# Patient Record
Sex: Male | Born: 1940 | Race: White | Hispanic: No | Marital: Single | Smoking: Never smoker
Health system: Southern US, Community
[De-identification: ages and names within clinical notes are randomized; demographics above are authoritative.]

## PROBLEM LIST (undated history)

## (undated) DIAGNOSIS — E119 Type 2 diabetes mellitus without complications: Secondary | ICD-10-CM

## (undated) DIAGNOSIS — J9600 Acute respiratory failure, unspecified whether with hypoxia or hypercapnia: Secondary | ICD-10-CM

## (undated) DIAGNOSIS — I4891 Unspecified atrial fibrillation: Secondary | ICD-10-CM

## (undated) DIAGNOSIS — I2510 Atherosclerotic heart disease of native coronary artery without angina pectoris: Secondary | ICD-10-CM

## (undated) DIAGNOSIS — I10 Essential (primary) hypertension: Secondary | ICD-10-CM

## (undated) DIAGNOSIS — I5042 Chronic combined systolic (congestive) and diastolic (congestive) heart failure: Secondary | ICD-10-CM

---

## 1999-08-04 HISTORY — PX: CORONARY ARTERY BYPASS GRAFT: SHX141

## 1999-09-09 ENCOUNTER — Inpatient Hospital Stay (HOSPITAL_COMMUNITY): Admission: AD | Admit: 1999-09-09 | Discharge: 1999-09-16 | Payer: Self-pay | Admitting: Cardiovascular Disease

## 1999-09-10 ENCOUNTER — Encounter: Payer: Self-pay | Admitting: Cardiovascular Disease

## 1999-09-11 ENCOUNTER — Encounter: Payer: Self-pay | Admitting: Cardiothoracic Surgery

## 1999-09-12 ENCOUNTER — Encounter: Payer: Self-pay | Admitting: Cardiothoracic Surgery

## 1999-09-13 ENCOUNTER — Encounter: Payer: Self-pay | Admitting: Cardiothoracic Surgery

## 1999-09-27 ENCOUNTER — Inpatient Hospital Stay (HOSPITAL_COMMUNITY): Admission: EM | Admit: 1999-09-27 | Discharge: 1999-10-03 | Payer: Self-pay | Admitting: Emergency Medicine

## 1999-09-27 ENCOUNTER — Encounter: Payer: Self-pay | Admitting: Thoracic Surgery (Cardiothoracic Vascular Surgery)

## 2004-09-01 ENCOUNTER — Ambulatory Visit (HOSPITAL_COMMUNITY): Admission: RE | Admit: 2004-09-01 | Discharge: 2004-09-01 | Payer: Self-pay | Admitting: Internal Medicine

## 2004-09-01 ENCOUNTER — Ambulatory Visit: Payer: Self-pay | Admitting: Internal Medicine

## 2013-08-03 HISTORY — PX: LEFT HEART CATH: SHX5946

## 2013-08-17 ENCOUNTER — Emergency Department (HOSPITAL_COMMUNITY): Payer: Medicare Other

## 2013-08-17 ENCOUNTER — Inpatient Hospital Stay (HOSPITAL_COMMUNITY)
Admission: EM | Admit: 2013-08-17 | Discharge: 2013-08-23 | DRG: 280 | Disposition: A | Discharge status: Home / Self Care | Payer: Medicare Other | Attending: Internal Medicine | Admitting: Internal Medicine

## 2013-08-17 ENCOUNTER — Encounter (HOSPITAL_COMMUNITY): Payer: Self-pay | Admitting: Emergency Medicine

## 2013-08-17 ENCOUNTER — Other Ambulatory Visit: Payer: Self-pay

## 2013-08-17 DIAGNOSIS — I509 Heart failure, unspecified: Secondary | ICD-10-CM | POA: Diagnosis present

## 2013-08-17 DIAGNOSIS — Z79899 Other long term (current) drug therapy: Secondary | ICD-10-CM

## 2013-08-17 DIAGNOSIS — E876 Hypokalemia: Secondary | ICD-10-CM | POA: Diagnosis not present

## 2013-08-17 DIAGNOSIS — E872 Acidosis, unspecified: Secondary | ICD-10-CM | POA: Diagnosis present

## 2013-08-17 DIAGNOSIS — I2510 Atherosclerotic heart disease of native coronary artery without angina pectoris: Secondary | ICD-10-CM | POA: Diagnosis present

## 2013-08-17 DIAGNOSIS — E785 Hyperlipidemia, unspecified: Secondary | ICD-10-CM | POA: Diagnosis present

## 2013-08-17 DIAGNOSIS — I2589 Other forms of chronic ischemic heart disease: Secondary | ICD-10-CM | POA: Diagnosis present

## 2013-08-17 DIAGNOSIS — I4729 Other ventricular tachycardia: Secondary | ICD-10-CM | POA: Diagnosis not present

## 2013-08-17 DIAGNOSIS — F411 Generalized anxiety disorder: Secondary | ICD-10-CM | POA: Diagnosis present

## 2013-08-17 DIAGNOSIS — I4891 Unspecified atrial fibrillation: Secondary | ICD-10-CM | POA: Diagnosis present

## 2013-08-17 DIAGNOSIS — J811 Chronic pulmonary edema: Secondary | ICD-10-CM

## 2013-08-17 DIAGNOSIS — I10 Essential (primary) hypertension: Secondary | ICD-10-CM | POA: Diagnosis present

## 2013-08-17 DIAGNOSIS — I2582 Chronic total occlusion of coronary artery: Secondary | ICD-10-CM | POA: Diagnosis present

## 2013-08-17 DIAGNOSIS — I5043 Acute on chronic combined systolic (congestive) and diastolic (congestive) heart failure: Secondary | ICD-10-CM | POA: Diagnosis present

## 2013-08-17 DIAGNOSIS — I472 Ventricular tachycardia, unspecified: Secondary | ICD-10-CM | POA: Diagnosis not present

## 2013-08-17 DIAGNOSIS — E119 Type 2 diabetes mellitus without complications: Secondary | ICD-10-CM | POA: Diagnosis present

## 2013-08-17 DIAGNOSIS — I25810 Atherosclerosis of coronary artery bypass graft(s) without angina pectoris: Secondary | ICD-10-CM | POA: Diagnosis present

## 2013-08-17 DIAGNOSIS — I959 Hypotension, unspecified: Secondary | ICD-10-CM | POA: Diagnosis present

## 2013-08-17 DIAGNOSIS — I214 Non-ST elevation (NSTEMI) myocardial infarction: Secondary | ICD-10-CM

## 2013-08-17 DIAGNOSIS — I5041 Acute combined systolic (congestive) and diastolic (congestive) heart failure: Secondary | ICD-10-CM | POA: Diagnosis present

## 2013-08-17 DIAGNOSIS — J9600 Acute respiratory failure, unspecified whether with hypoxia or hypercapnia: Secondary | ICD-10-CM

## 2013-08-17 DIAGNOSIS — I498 Other specified cardiac arrhythmias: Secondary | ICD-10-CM | POA: Diagnosis present

## 2013-08-17 DIAGNOSIS — J810 Acute pulmonary edema: Secondary | ICD-10-CM | POA: Diagnosis present

## 2013-08-17 DIAGNOSIS — Z880 Allergy status to penicillin: Secondary | ICD-10-CM

## 2013-08-17 HISTORY — DX: Type 2 diabetes mellitus without complications: E11.9

## 2013-08-17 HISTORY — DX: Essential (primary) hypertension: I10

## 2013-08-17 HISTORY — DX: Chronic combined systolic (congestive) and diastolic (congestive) heart failure: I50.42

## 2013-08-17 HISTORY — DX: Acute respiratory failure, unspecified whether with hypoxia or hypercapnia: J96.00

## 2013-08-17 HISTORY — DX: Atherosclerotic heart disease of native coronary artery without angina pectoris: I25.10

## 2013-08-17 HISTORY — DX: Unspecified atrial fibrillation: I48.91

## 2013-08-17 LAB — CBC WITH DIFFERENTIAL/PLATELET
BASOS PCT: 1 % (ref 0–1)
Basophils Absolute: 0.2 10*3/uL — ABNORMAL HIGH (ref 0.0–0.1)
EOS ABS: 0.3 10*3/uL (ref 0.0–0.7)
Eosinophils Relative: 2 % (ref 0–5)
HEMATOCRIT: 44.6 % (ref 39.0–52.0)
Hemoglobin: 15.2 g/dL (ref 13.0–17.0)
Lymphocytes Relative: 44 % (ref 12–46)
Lymphs Abs: 7 10*3/uL — ABNORMAL HIGH (ref 0.7–4.0)
MCH: 31.5 pg (ref 26.0–34.0)
MCHC: 34.1 g/dL (ref 30.0–36.0)
MCV: 92.3 fL (ref 78.0–100.0)
Monocytes Absolute: 0.6 10*3/uL (ref 0.1–1.0)
Monocytes Relative: 4 % (ref 3–12)
NEUTROS ABS: 7.7 10*3/uL (ref 1.7–7.7)
Neutrophils Relative %: 49 % (ref 43–77)
Platelets: 282 10*3/uL (ref 150–400)
RBC: 4.83 MIL/uL (ref 4.22–5.81)
RDW: 13.9 % (ref 11.5–15.5)
WBC: 15.8 10*3/uL — ABNORMAL HIGH (ref 4.0–10.5)

## 2013-08-17 LAB — BLOOD GAS, ARTERIAL
ACID-BASE DEFICIT: 2.7 mmol/L — AB (ref 0.0–2.0)
Acid-base deficit: 5.7 mmol/L — ABNORMAL HIGH (ref 0.0–2.0)
BICARBONATE: 21.4 meq/L (ref 20.0–24.0)
Bicarbonate: 22.4 mEq/L (ref 20.0–24.0)
DRAWN BY: 213101
Drawn by: 213101
FIO2: 100 %
FIO2: 100 %
LHR: 16 {breaths}/min
MECHVT: 500 mL
O2 Saturation: 91.3 %
O2 Saturation: 95.2 %
PCO2 ART: 44.3 mmHg (ref 35.0–45.0)
PEEP: 5 cmH2O
PEEP: 5 cmH2O
PH ART: 7.181 — AB (ref 7.350–7.450)
PO2 ART: 82.1 mmHg (ref 80.0–100.0)
Patient temperature: 37
Patient temperature: 37
RATE: 15 resp/min
TCO2: 19.9 mmol/L (ref 0–100)
TCO2: 20.2 mmol/L (ref 0–100)
VT: 600 mL
pCO2 arterial: 59.6 mmHg (ref 35.0–45.0)
pH, Arterial: 7.324 — ABNORMAL LOW (ref 7.350–7.450)
pO2, Arterial: 91.1 mmHg (ref 80.0–100.0)

## 2013-08-17 LAB — URINALYSIS, ROUTINE W REFLEX MICROSCOPIC
Bilirubin Urine: NEGATIVE
Glucose, UA: 250 mg/dL — AB
Ketones, ur: NEGATIVE mg/dL
LEUKOCYTES UA: NEGATIVE
NITRITE: NEGATIVE
PH: 5.5 (ref 5.0–8.0)
Protein, ur: 100 mg/dL — AB
Urobilinogen, UA: 0.2 mg/dL (ref 0.0–1.0)

## 2013-08-17 LAB — BASIC METABOLIC PANEL
BUN: 24 mg/dL — ABNORMAL HIGH (ref 6–23)
CALCIUM: 8.1 mg/dL — AB (ref 8.4–10.5)
CO2: 19 mEq/L (ref 19–32)
Chloride: 99 mEq/L (ref 96–112)
Creatinine, Ser: 1.09 mg/dL (ref 0.50–1.35)
GFR calc Af Amer: 75 mL/min — ABNORMAL LOW (ref >90)
GFR calc non Af Amer: 65 mL/min — ABNORMAL LOW (ref >90)
Glucose, Bld: 366 mg/dL — ABNORMAL HIGH (ref 70–99)
Potassium: 4.3 mEq/L (ref 3.7–5.3)
Sodium: 138 mEq/L (ref 137–147)

## 2013-08-17 LAB — URINE MICROSCOPIC-ADD ON

## 2013-08-17 LAB — PRO B NATRIURETIC PEPTIDE: PRO B NATRI PEPTIDE: 5197 pg/mL — AB (ref 0–125)

## 2013-08-17 LAB — LACTIC ACID, PLASMA: LACTIC ACID, VENOUS: 5.1 mmol/L — AB (ref 0.5–2.2)

## 2013-08-17 LAB — PROTIME-INR
INR: 1.19 (ref 0.00–1.49)
PROTHROMBIN TIME: 14.8 s (ref 11.6–15.2)

## 2013-08-17 LAB — APTT: APTT: 31 s (ref 24–37)

## 2013-08-17 LAB — TROPONIN I: Troponin I: <0.3 ng/mL (ref <0.30)

## 2013-08-17 MED ORDER — ETOMIDATE 2 MG/ML IV SOLN
30.0000 mg | Freq: Once | INTRAVENOUS | Status: AC
  Administered 2013-08-17: 30 mg via INTRAVENOUS

## 2013-08-17 MED ORDER — MIDAZOLAM HCL 5 MG/5ML IJ SOLN
INTRAMUSCULAR | Status: AC
  Administered 2013-08-17: 21:00:00 4 mg via INTRAVENOUS
  Filled 2013-08-17: qty 5

## 2013-08-17 MED ORDER — MIDAZOLAM HCL 50 MG/10ML IJ SOLN
INTRAMUSCULAR | Status: AC
  Filled 2013-08-17: qty 1

## 2013-08-17 MED ORDER — DILTIAZEM HCL 100 MG IV SOLR
10.0000 mg/h | Freq: Once | INTRAVENOUS | Status: AC
  Administered 2013-08-17: 10 mg/h via INTRAVENOUS

## 2013-08-17 MED ORDER — MIDAZOLAM HCL 5 MG/5ML IJ SOLN
INTRAMUSCULAR | Status: AC
  Administered 2013-08-17: 20:00:00 4 mg via INTRAVENOUS
  Filled 2013-08-17: qty 5

## 2013-08-17 MED ORDER — IOHEXOL 350 MG/ML SOLN
100.0000 mL | Freq: Once | INTRAVENOUS | Status: AC | PRN
  Administered 2013-08-17: 100 mL via INTRAVENOUS

## 2013-08-17 MED ORDER — AMIODARONE HCL IN DEXTROSE 360-4.14 MG/200ML-% IV SOLN: 60.0000 mg/h | INTRAVENOUS | Status: DC

## 2013-08-17 MED ORDER — DILTIAZEM HCL 25 MG/5ML IV SOLN
10.0000 mg | Freq: Once | INTRAVENOUS | Status: AC
  Administered 2013-08-17: 10 mg via INTRAVENOUS

## 2013-08-17 MED ORDER — AMIODARONE HCL IN DEXTROSE 360-4.14 MG/200ML-% IV SOLN: 30.0000 mg/h | INTRAVENOUS | Status: DC

## 2013-08-17 MED ORDER — SUCCINYLCHOLINE CHLORIDE 20 MG/ML IJ SOLN
150.0000 mg | Freq: Once | INTRAMUSCULAR | Status: AC
  Administered 2013-08-17: 150 mg via INTRAVENOUS

## 2013-08-17 MED ORDER — SODIUM CHLORIDE 0.9 % IV SOLN
2.0000 mg/h | INTRAVENOUS | Status: DC
  Administered 2013-08-17: 2 mg/h via INTRAVENOUS

## 2013-08-17 MED ORDER — AMIODARONE HCL 150 MG/3ML IV SOLN
INTRAVENOUS | Status: AC
  Filled 2013-08-17: qty 9

## 2013-08-17 MED ORDER — MIDAZOLAM HCL 5 MG/5ML IJ SOLN
4.0000 mg | Freq: Once | INTRAMUSCULAR | Status: AC
  Administered 2013-08-17 (×2): 4 mg via INTRAVENOUS

## 2013-08-17 MED ORDER — DILTIAZEM HCL 25 MG/5ML IV SOLN
INTRAVENOUS | Status: AC
  Filled 2013-08-17: qty 5

## 2013-08-17 NOTE — ED Provider Notes (Signed)
CSN: [id number]     Arrival date & time 08/17/13  1951 History  This chart was scribed for Gilda Crease, * by Danella Maiers, ED Scribe. This patient was seen in room APA01/APA01 and the patient's care was started at 8:01 PM.    Chief Complaint  Patient presents with  . Respiratory Distress   The history is provided by the EMS personnel. The history is limited by the condition of the patient. No language interpreter was used.  Level 5 Caveat - Respiratory Distress  HPI Comments: Randy Nunez is a 73 y.o. male with a h/o recent pneumonia, HTN, hyperlipidemia brought in by ambulance who presents to the Emergency Department in respiratory distress onset 1 1/2 hours ago per EMS.    No past medical history on file. No past surgical history on file. No family history on file. History  Substance Use Topics  . Smoking status: Not on file  . Smokeless tobacco: Not on file  . Alcohol Use: Not on file    Review of Systems  Unable to perform ROS: Severe respiratory distress    Allergies  Review of patient's allergies indicates not on file.  Home Medications  No current outpatient prescriptions on file. BP 105/67  Pulse 89  Temp(Src) 98.5 F (36.9 C) (Rectal)  Resp 17  SpO2 94% Physical Exam  Constitutional: He appears well-developed and well-nourished.  HENT:  Head: Normocephalic and atraumatic.  Right Ear: Hearing normal.  Left Ear: Hearing normal.  Nose: Nose normal.  Mouth/Throat: Oropharynx is clear and moist and mucous membranes are normal.  Eyes: Conjunctivae and EOM are normal. Pupils are equal, round, and reactive to light.  Neck: Normal range of motion. Neck supple.  Cardiovascular: S1 normal and S2 normal.  An irregularly irregular rhythm present. Tachycardia present.  Exam reveals no gallop and no friction rub.   No murmur heard. Pulmonary/Chest: Accessory muscle usage present. He is in respiratory distress. He has decreased breath sounds. He has rales. He  exhibits no tenderness.  Abdominal: Soft. Normal appearance and bowel sounds are normal. There is no hepatosplenomegaly. There is no tenderness. There is no rebound, no guarding, no tenderness at McBurney's point and negative Murphy's sign. No hernia.  Musculoskeletal: Normal range of motion.  Neurological: He is alert. He has normal strength. No cranial nerve deficit or sensory deficit. Coordination normal.  Noncommunicative secondary to severe respiratory distress  Skin: Skin is warm, dry and intact. No rash noted. No cyanosis.  Psychiatric: He has a normal mood and affect. His speech is normal and behavior is normal. Thought content normal.    ED Course  INTUBATION Date/Time: 08/17/2013 10:46 PM Performed by: Gilda Crease. Authorized by: Gilda Crease Consent: The procedure was performed in an emergent situation. Patient identity confirmed: arm band and hospital-assigned identification number Time out: Immediately prior to procedure a "time out" was called to verify the correct patient, procedure, equipment, support staff and site/side marked as required. Indications: respiratory distress, respiratory failure and airway protection Intubation method: direct Patient status: paralyzed (RSI) Preoxygenation: BVM Pretreatment medications: none Sedatives: propofol Paralytic: succinylcholine Laryngoscope size: Mac 4 Tube size: 8.0 mm Tube type: cuffed Number of attempts: 1 Cricoid pressure: no Cords visualized: yes Post-procedure assessment: chest rise and CO2 detector Breath sounds: equal and absent over the epigastrium Cuff inflated: yes ETT to lip: 25 cm Tube secured with: ETT holder Chest x-ray interpreted by me and radiologist. Chest x-ray findings: endotracheal tube in appropriate position Patient tolerance: Patient  tolerated the procedure well with no immediate complications.   (including critical care time) Medications  amiodarone (NEXTERONE PREMIX) 360  mg/200 mL dextrose IV infusion (60 mg/hr Intravenous Not Given 08/17/13 2047)  amiodarone (NEXTERONE PREMIX) 360 mg/200 mL dextrose IV infusion (not administered)  midazolam (VERSED) 1 mg/mL in sodium chloride 0.9 % 50 mL infusion (3 mg/hr Intravenous Rate/Dose Change 08/17/13 2136)  etomidate (AMIDATE) injection 30 mg (30 mg Intravenous Given 08/17/13 1958)  succinylcholine (ANECTINE) injection 150 mg (150 mg Intravenous Given 08/17/13 1959)  diltiazem (CARDIZEM) injection 10 mg (0 mg Intravenous Stopped 08/17/13 2017)  diltiazem (CARDIZEM) injection 10 mg (0 mg Intravenous Stopped 08/17/13 2032)  midazolam (VERSED) 5 MG/5ML injection 4 mg (4 mg Intravenous Given 08/17/13 2032)  diltiazem (CARDIZEM) 100 mg in dextrose 5 % 100 mL infusion (10 mg/hr Intravenous New Bag/Given 08/17/13 2015)    DIAGNOSTIC STUDIES: Oxygen Saturation is 94% on RA, adequate by my interpretation.    COORDINATION OF CARE: 8:07 PM- Treatment plan includes EKG, CXR, troponin, basic labs.    Labs Review Labs Reviewed  CBC WITH DIFFERENTIAL - Abnormal; Notable for the following:    WBC 15.8 (*)    Lymphs Abs 7.0 (*)    Basophils Absolute 0.2 (*)    All other components within normal limits  BASIC METABOLIC PANEL - Abnormal; Notable for the following:    Glucose, Bld 366 (*)    BUN 24 (*)    Calcium 8.1 (*)    GFR calc non Af Amer 65 (*)    GFR calc Af Amer 75 (*)    All other components within normal limits  PRO B NATRIURETIC PEPTIDE - Abnormal; Notable for the following:    Pro B Natriuretic peptide (BNP) 5197.0 (*)    All other components within normal limits  LACTIC ACID, PLASMA - Abnormal; Notable for the following:    Lactic Acid, Venous 5.1 (*)    All other components within normal limits  BLOOD GAS, ARTERIAL - Abnormal; Notable for the following:    pH, Arterial 7.181 (*)    pCO2 arterial 59.6 (*)    Acid-base deficit 5.7 (*)    All other components within normal limits  URINALYSIS, ROUTINE W REFLEX  MICROSCOPIC - Abnormal; Notable for the following:    Specific Gravity, Urine >1.030 (*)    Glucose, UA 250 (*)    Hgb urine dipstick SMALL (*)    Protein, ur 100 (*)    All other components within normal limits  URINE MICROSCOPIC-ADD ON - Abnormal; Notable for the following:    Squamous Epithelial / LPF FEW (*)    All other components within normal limits  TROPONIN I  PROTIME-INR  APTT  PATHOLOGIST SMEAR REVIEW   Imaging Review Dg Chest Port 1 View  08/17/2013   CLINICAL DATA:  Respiratory respiratory distress. ET tube placement.  EXAM: PORTABLE CHEST - 1 VIEW  COMPARISON:  None.  FINDINGS: NG tube is in place and courses into the stomach and below the inferior margin of the film. Endotracheal tube is also in place with the tip approximately 2.6 cm above the carina. There is marked cardiomegaly and extensive bilateral pulmonary edema. No pneumothorax or pleural effusion.  IMPRESSION: ETT tip is approximately 2.6 cm above the carina.  Cardiomegaly and extensive bilateral pulmonary edema.   Electronically Signed   By: Drusilla Kannerhomas  Dalessio M.D.   On: 08/17/2013 20:14    EKG Interpretation   None       MDM  Diagnosis: 1. Pulmonary edema 2. Atrial fibrillation with rapid ventricular response  Patient brought to the ER by EMS with severe respiratory distress. Patient was extremely fatigued and it was felt that the patient was in severe respiratory failure, possible respiratory arrest in pending. It was decided to intubate the patient. This was performed without difficulty.   EMS reported the patient has been in atrial fibrillation with aberrant conduction, rapid ventricular response. He has no known history of A. fib. Initially the patient was extremely rapid with a wide-complex tachycardia, V. tach was considered. Amiodarone drip was considered, but as the patient was monitored for a short period of time his rhythm was convincingly irregular and atrial fibrillation with aberrant conduction or  left bundle branch block was confirmed. It was therefore felt that Cardizem would be appropriate. The patient was given a Cardizem bolus with decrease of his heart rate into the 110 range. Patient's QRS complex became narrow and it was irregular, consistent with atrial fibrillation. The patient was placed on a Cardizem drip with further improvement in his rate, down into the 90s.  Patient was administered IV Versed for sedation. He has required titration upwards for agitation.  Chest x-ray performed after intubation revealed that the patient did in fact have pulmonary edema.  Case was discussed with Doctor Tyson Alias, call for critical care at [facility]. He has accepted the patient for transfer. He did request CT angiography to rule out PE. This was performed, no PE is seen. ET tube is now seen as too deep, at the carina. It was withdrawn 3 cm.  When he came back from radiology, he was also somewhat hypotensive, systolic around 90. Cardizem was briefly stopped and then restarted at 5 (was at 10 mg per hour).  CRITICAL CARE Performed by: Gilda Crease   Total critical care time:  Critical care time was exclusive of separately billable procedures and treating other patients.  Critical care was necessary to treat or prevent imminent or life-threatening deterioration.  Critical care was time spent personally by me on the following activities: development of treatment plan with patient and/or surrogate as well as nursing, discussions with consultants, evaluation of patient's response to treatment, examination of patient, obtaining history from patient or surrogate, ordering and performing treatments and interventions, ordering and review of laboratory studies, ordering and review of radiographic studies, pulse oximetry and re-evaluation of patient's condition.     I personally performed the services described in this documentation, which was scribed in my presence. The recorded  information has been reviewed and is accurate.   Gilda Crease, MD 08/17/13 2251

## 2013-08-17 NOTE — ED Notes (Signed)
Pt from home by [facility], respiratory distress, bipap en routed,-

## 2013-08-17 NOTE — ED Notes (Signed)
Per family, pt was working today and then helped someone pump some gas and suddenly became sob.

## 2013-08-18 ENCOUNTER — Encounter (HOSPITAL_COMMUNITY): Payer: Self-pay | Admitting: Nurse Practitioner

## 2013-08-18 ENCOUNTER — Inpatient Hospital Stay (HOSPITAL_COMMUNITY): Payer: Medicare Other

## 2013-08-18 DIAGNOSIS — E119 Type 2 diabetes mellitus without complications: Secondary | ICD-10-CM | POA: Diagnosis present

## 2013-08-18 DIAGNOSIS — I10 Essential (primary) hypertension: Secondary | ICD-10-CM

## 2013-08-18 DIAGNOSIS — J9600 Acute respiratory failure, unspecified whether with hypoxia or hypercapnia: Secondary | ICD-10-CM

## 2013-08-18 DIAGNOSIS — I509 Heart failure, unspecified: Secondary | ICD-10-CM

## 2013-08-18 DIAGNOSIS — J810 Acute pulmonary edema: Secondary | ICD-10-CM

## 2013-08-18 DIAGNOSIS — I214 Non-ST elevation (NSTEMI) myocardial infarction: Secondary | ICD-10-CM | POA: Diagnosis present

## 2013-08-18 DIAGNOSIS — I2510 Atherosclerotic heart disease of native coronary artery without angina pectoris: Secondary | ICD-10-CM | POA: Diagnosis present

## 2013-08-18 DIAGNOSIS — E785 Hyperlipidemia, unspecified: Secondary | ICD-10-CM

## 2013-08-18 DIAGNOSIS — I5043 Acute on chronic combined systolic (congestive) and diastolic (congestive) heart failure: Secondary | ICD-10-CM | POA: Diagnosis present

## 2013-08-18 DIAGNOSIS — I517 Cardiomegaly: Secondary | ICD-10-CM

## 2013-08-18 DIAGNOSIS — I4891 Unspecified atrial fibrillation: Secondary | ICD-10-CM

## 2013-08-18 LAB — BASIC METABOLIC PANEL
BUN: 23 mg/dL (ref 6–23)
BUN: 22 mg/dL (ref 6–23)
CHLORIDE: 105 meq/L (ref 96–112)
CO2: 23 mEq/L (ref 19–32)
CO2: 24 meq/L (ref 19–32)
CREATININE: 0.95 mg/dL (ref 0.50–1.35)
Calcium: 8.1 mg/dL — ABNORMAL LOW (ref 8.4–10.5)
Calcium: 8.2 mg/dL — ABNORMAL LOW (ref 8.4–10.5)
Chloride: 106 mEq/L (ref 96–112)
Creatinine, Ser: 0.96 mg/dL (ref 0.50–1.35)
GFR calc Af Amer: >90 mL/min (ref >90)
GFR calc non Af Amer: 80 mL/min — ABNORMAL LOW (ref >90)
GFR calc non Af Amer: 80 mL/min — ABNORMAL LOW (ref >90)
GLUCOSE: 144 mg/dL — AB (ref 70–99)
GLUCOSE: 110 mg/dL — AB (ref 70–99)
POTASSIUM: 4.2 meq/L (ref 3.7–5.3)
Potassium: 4 mEq/L (ref 3.7–5.3)
Sodium: 141 mEq/L (ref 137–147)
Sodium: 143 mEq/L (ref 137–147)

## 2013-08-18 LAB — GLUCOSE, CAPILLARY
GLUCOSE-CAPILLARY: 153 mg/dL — AB (ref 70–99)
GLUCOSE-CAPILLARY: 102 mg/dL — AB (ref 70–99)
GLUCOSE-CAPILLARY: 127 mg/dL — AB (ref 70–99)
Glucose-Capillary: 109 mg/dL — ABNORMAL HIGH (ref 70–99)
Glucose-Capillary: 124 mg/dL — ABNORMAL HIGH (ref 70–99)
Glucose-Capillary: 112 mg/dL — ABNORMAL HIGH (ref 70–99)

## 2013-08-18 LAB — HEMOGLOBIN A1C
Hgb A1c MFr Bld: 6.6 % — ABNORMAL HIGH (ref <5.7)
MEAN PLASMA GLUCOSE: 143 mg/dL — AB (ref <117)

## 2013-08-18 LAB — MRSA PCR SCREENING: MRSA BY PCR: NEGATIVE

## 2013-08-18 LAB — HEPARIN LEVEL (UNFRACTIONATED): HEPARIN UNFRACTIONATED: 0.46 [IU]/mL (ref 0.30–0.70)

## 2013-08-18 LAB — TRIGLYCERIDES: Triglycerides: 76 mg/dL (ref <150)

## 2013-08-18 LAB — LACTIC ACID, PLASMA: Lactic Acid, Venous: 2.1 mmol/L (ref 0.5–2.2)

## 2013-08-18 LAB — TROPONIN I
Troponin I: 1.41 ng/mL (ref <0.30)
Troponin I: 3.09 ng/mL (ref <0.30)

## 2013-08-18 LAB — PHOSPHORUS: Phosphorus: 3.7 mg/dL (ref 2.3–4.6)

## 2013-08-18 LAB — MAGNESIUM: Magnesium: 1.7 mg/dL (ref 1.5–2.5)

## 2013-08-18 LAB — TSH: TSH: 1.358 u[IU]/mL (ref 0.350–4.500)

## 2013-08-18 MED ORDER — ASPIRIN 325 MG PO TABS
325.0000 mg | ORAL_TABLET | Freq: Once | ORAL | Status: AC
  Administered 2013-08-18: 325 mg
  Filled 2013-08-18: qty 1

## 2013-08-18 MED ORDER — HEPARIN (PORCINE) IN NACL 100-0.45 UNIT/ML-% IJ SOLN
1300.0000 [IU]/h | INTRAMUSCULAR | Status: DC
  Administered 2013-08-18 – 2013-08-19 (×2): 1300 [IU]/h via INTRAVENOUS
  Filled 2013-08-18 (×5): qty 250

## 2013-08-18 MED ORDER — AMIODARONE LOAD VIA INFUSION
150.0000 mg | Freq: Once | INTRAVENOUS | Status: AC
  Administered 2013-08-18: 150 mg via INTRAVENOUS
  Filled 2013-08-18: qty 83.34

## 2013-08-18 MED ORDER — ASPIRIN 325 MG PO TABS
325.0000 mg | ORAL_TABLET | Freq: Every day | ORAL | Status: DC
  Administered 2013-08-18 – 2013-08-20 (×3): 325 mg
  Filled 2013-08-18 (×4): qty 1

## 2013-08-18 MED ORDER — FENTANYL CITRATE 0.05 MG/ML IJ SOLN: 50.0000 ug | INTRAMUSCULAR | Status: DC | PRN

## 2013-08-18 MED ORDER — AMIODARONE HCL IN DEXTROSE 360-4.14 MG/200ML-% IV SOLN
60.0000 mg/h | INTRAVENOUS | Status: DC
  Administered 2013-08-18: 60 mg/h via INTRAVENOUS

## 2013-08-18 MED ORDER — ALPRAZOLAM 0.5 MG PO TABS
0.5000 mg | ORAL_TABLET | Freq: Every day | ORAL | Status: DC | PRN
  Administered 2013-08-19 – 2013-08-20 (×2): 0.5 mg via ORAL
  Filled 2013-08-18 (×2): qty 1

## 2013-08-18 MED ORDER — LABETALOL HCL 5 MG/ML IV SOLN
5.0000 mg | INTRAVENOUS | Status: DC | PRN
  Filled 2013-08-18 (×2): qty 4

## 2013-08-18 MED ORDER — ATORVASTATIN CALCIUM 80 MG PO TABS
80.0000 mg | ORAL_TABLET | Freq: Every day | ORAL | Status: DC
  Administered 2013-08-18 – 2013-08-22 (×6): 80 mg
  Filled 2013-08-18 (×8): qty 1

## 2013-08-18 MED ORDER — SODIUM CHLORIDE 0.9 % IJ SOLN
3.0000 mL | Freq: Two times a day (BID) | INTRAMUSCULAR | Status: DC
  Administered 2013-08-18 – 2013-08-20 (×5): 3 mL via INTRAVENOUS

## 2013-08-18 MED ORDER — FUROSEMIDE 10 MG/ML IJ SOLN
40.0000 mg | Freq: Two times a day (BID) | INTRAMUSCULAR | Status: DC
  Administered 2013-08-18: 40 mg via INTRAVENOUS
  Filled 2013-08-18: qty 4

## 2013-08-18 MED ORDER — CARVEDILOL 3.125 MG PO TABS
3.1250 mg | ORAL_TABLET | Freq: Two times a day (BID) | ORAL | Status: DC
  Administered 2013-08-18: 3.125 mg via ORAL
  Filled 2013-08-18 (×3): qty 1

## 2013-08-18 MED ORDER — HEPARIN SODIUM (PORCINE) 5000 UNIT/ML IJ SOLN
5000.0000 [IU] | Freq: Three times a day (TID) | INTRAMUSCULAR | Status: DC
  Administered 2013-08-18: 5000 [IU] via SUBCUTANEOUS
  Filled 2013-08-18 (×4): qty 1

## 2013-08-18 MED ORDER — HEPARIN BOLUS VIA INFUSION
3000.0000 [IU] | Freq: Once | INTRAVENOUS | Status: AC
  Administered 2013-08-18: 3000 [IU] via INTRAVENOUS
  Filled 2013-08-18: qty 3000

## 2013-08-18 MED ORDER — FUROSEMIDE 10 MG/ML IJ SOLN
80.0000 mg | Freq: Two times a day (BID) | INTRAMUSCULAR | Status: DC
  Administered 2013-08-18: 80 mg via INTRAVENOUS
  Filled 2013-08-18 (×3): qty 8

## 2013-08-18 MED ORDER — AMIODARONE HCL IN DEXTROSE 360-4.14 MG/200ML-% IV SOLN
30.0000 mg/h | INTRAVENOUS | Status: DC
  Filled 2013-08-18: qty 200

## 2013-08-18 MED ORDER — METOPROLOL TARTRATE 1 MG/ML IV SOLN
INTRAVENOUS | Status: AC
  Administered 2013-08-18: 5 mg
  Filled 2013-08-18: qty 5

## 2013-08-18 MED ORDER — BIOTENE DRY MOUTH MT LIQD
15.0000 mL | Freq: Four times a day (QID) | OROMUCOSAL | Status: DC
  Administered 2013-08-18 – 2013-08-19 (×5): 15 mL via OROMUCOSAL

## 2013-08-18 MED ORDER — SODIUM CHLORIDE 0.9 % IV SOLN
INTRAVENOUS | Status: DC
  Administered 2013-08-18: 1 mL via INTRAVENOUS

## 2013-08-18 MED ORDER — METOPROLOL TARTRATE 1 MG/ML IV SOLN
5.0000 mg | Freq: Four times a day (QID) | INTRAVENOUS | Status: DC
  Administered 2013-08-19 (×3): 5 mg via INTRAVENOUS
  Filled 2013-08-18 (×6): qty 5

## 2013-08-18 MED ORDER — INSULIN ASPART 100 UNIT/ML ~~LOC~~ SOLN
0.0000 [IU] | SUBCUTANEOUS | Status: DC
  Administered 2013-08-18 (×2): 1 [IU] via SUBCUTANEOUS
  Administered 2013-08-18: 2 [IU] via SUBCUTANEOUS
  Administered 2013-08-19: 1 [IU] via SUBCUTANEOUS
  Administered 2013-08-19: 2 [IU] via SUBCUTANEOUS
  Administered 2013-08-19 – 2013-08-20 (×3): 1 [IU] via SUBCUTANEOUS

## 2013-08-18 MED ORDER — PANTOPRAZOLE SODIUM 40 MG IV SOLR
40.0000 mg | Freq: Every day | INTRAVENOUS | Status: DC
  Administered 2013-08-18: 40 mg via INTRAVENOUS
  Filled 2013-08-18 (×4): qty 40

## 2013-08-18 MED ORDER — CHLORHEXIDINE GLUCONATE 0.12 % MT SOLN
15.0000 mL | Freq: Two times a day (BID) | OROMUCOSAL | Status: DC
  Administered 2013-08-18 – 2013-08-19 (×4): 15 mL via OROMUCOSAL
  Filled 2013-08-18 (×4): qty 15

## 2013-08-18 MED ORDER — PROPOFOL 10 MG/ML IV EMUL
0.0000 ug/kg/min | INTRAVENOUS | Status: DC
  Administered 2013-08-18: 10 ug/kg/min via INTRAVENOUS
  Filled 2013-08-18: qty 100

## 2013-08-18 NOTE — Progress Notes (Signed)
ANTICOAGULATION CONSULT NOTE - Follow Up Consult  Pharmacy Consult for Heparin Indication: atrial fibrillation  Allergies  Allergen Reactions  . Penicillins     Patient Measurements: Height: 5\' 10"  (177.8 cm) Weight: 215 lb 14.4 oz (97.932 kg) IBW/kg (Calculated) : 73 Heparin Dosing Weight: 93 kg  Vital Signs: Temp: 98.3 F (36.8 C) (01/16 1554) Temp src: Oral (01/16 1554) BP: 141/58 mmHg (01/16 1800) Pulse Rate: 89 (01/16 1800)  Labs:  Recent Labs  08/17/13 2050 08/18/13 0220 08/18/13 0924 08/18/13 1641 08/18/13 1917  HGB 15.2  --   --   --   --   HCT 44.6  --   --   --   --   PLT 282  --   --   --   --   APTT 31  --   --   --   --   LABPROT 14.8  --   --   --   --   INR 1.19  --   --   --   --   HEPARINUNFRC  --   --   --   --  0.46  CREATININE 1.09 0.96  --  0.95  --   TROPONINI <0.30 1.41* 3.09*  --   --     Estimated Creatinine Clearance: 80.1 ml/min (by C-G formula based on Cr of 0.95).   Medications:  Infusions:  . heparin 1,300 Units/hr (08/18/13 1200)    Assessment: 73 year old male receiving anticoagulation with Heparin for atrial fibrillation.  His initial heparin level is therapeutic.  Goal of Therapy:  Heparin level 0.3-0.7 units/ml Monitor platelets by anticoagulation protocol: Yes   Plan:  Continue Heparin at 1300 units/hr Check AM Heparin level and CBC  Estella Husk, Pharm.D., BCPS, AAHIVP Clinical Pharmacist Phone: [phone] or [phone] 08/18/2013, 7:53 PM

## 2013-08-18 NOTE — H&P (Signed)
Name: Randy Nunez MRN: [id number] DOB: 1940-11-15    ADMISSION DATE:  08/17/2013 CONSULTATION DATE:  08/18/2013  REFERRING MD :  Gilda Creasehristopher J. Pollina, MD. PRIMARY SERVICE: [facility]  CHIEF COMPLAINT:  SOB  BRIEF PATIENT DESCRIPTION: 73 y o Male , PMH of DM2, HTN, CAD S/p CABG (2001)- as per family, HTN, HLD . [facility], with SOB, was found to be in Atria Fib with RVR,  was intubated and started on Cardizem drip.   SIGNIFICANT EVENTS / STUDIES:  [facility] 08[facility]17/2013  LINES / TUBES: ETT- 08/17/2013  CULTURES: None  ANTIBIOTICS: None  HISTORY OF PRESENT ILLNESS:  73 y o Male with PMH of DM, HTN, HLD, CAD S/p CABG- 2001. Pt currently sedated, so could not obtain a history. Hx gotten from sister. Patient was working in his house today When he suddenly became short of breath. No known complaints of chest pain, no pedal edema, no complaints of cough or fever. Pt called the ambulance and [facility]. Chest xray done showed- Pulmonary edema, and pt was found to be in Atria Fib. Cardizem drip was started, and Patient was intubated due to difficulty breathing. CTA chest was done to rule out PE- Which was negative, but showed that one of the three Coronary artery bypass grafts might be occluded, but troponin X1 Negative. He was also placed on IV versed for sedation.  PAST MEDICAL HISTORY :  Past Medical History  Diagnosis Date  . Diabetes mellitus without complication   . Hypertension   . Heart disease    Past Surgical History  Procedure Laterality Date  . Coronary artery bypass graft     Prior to Admission medications   Medication Sig Start Date End Date Taking? Authorizing Provider  ALPRAZolam Prudy Feeler(XANAX) 0.5 MG tablet Take 0.5 mg by mouth daily as needed for anxiety.   Yes Historical Provider, MD  diltiazem (TIAZAC) 360 MG 24 hr capsule Take 360 mg by mouth daily.   Yes Historical Provider, MD  furosemide  (LASIX) 20 MG tablet Take 20 mg by mouth daily.   Yes Historical Provider, MD  glipiZIDE-metformin (METAGLIP) 5-500 MG per tablet Take 2 tablets by mouth 2 (two) times daily.   Yes Historical Provider, MD  lisinopril (PRINIVIL,ZESTRIL) 30 MG tablet Take 30 mg by mouth daily.   Yes Historical Provider, MD  metoprolol (LOPRESSOR) 100 MG tablet Take 100 mg by mouth 2 (two) times daily.   Yes Historical Provider, MD  pioglitazone (ACTOS) 30 MG tablet Take 30 mg by mouth daily.   Yes Historical Provider, MD  pravastatin (PRAVACHOL) 40 MG tablet Take 40 mg by mouth at bedtime.   Yes Historical Provider, MD   Allergies  Allergen Reactions  . Penicillins     FAMILY HISTORY:  No family history on file. SOCIAL HISTORY:  reports that he has never smoked. He does not have any smokeless tobacco history on file. He reports that he does not drink alcohol or use illicit drugs.  REVIEW OF SYSTEMS:  Could not be obtained- Pt sedated.  VITAL SIGNS: Temp:  [98.5 F (36.9 C)] 98.5 F (36.9 C) (01/15 2035) Pulse Rate:  [81-98] 81 (01/16 0000) Resp:  [15-26] 16 (01/16 0000) BP: (105-140)/(53-90) 106/53 mmHg (01/15 2226) SpO2:  [91 %-100 %] 100 % (01/16 0000) FiO2 (%):  [100 %] 100 % (01/16 0000) HEMODYNAMICS:   VENTILATOR SETTINGS: Vent Mode:  [-] PRVC FiO2 (%):  [  100 %] 100 % Set Rate:  [15 bmp-16 bmp] 16 bmp Vt Set:  [500 mL-600 mL] 600 mL PEEP:  [5 cmH20] 5 cmH20 Plateau Pressure:  [22 cmH20-23 cmH20] 23 cmH20 INTAKE / OUTPUT: Intake/Output   None     PHYSICAL EXAMINATION: General:  Sedated, ETT in place. Neuro:  Rass- -5. HEENT:  NCAT, Pupils- constricted but equal, and reactive to light. Cardiovascular:  S1, S2, irregularly irreg, no murmurs, rubs of gallops. Lungs:  Coarse breath sounds, but present bilaterally. Abdomen:  Soft, not distended, bowel sounds reduced. Musculoskeletal:  No pedal edema. Skin: Warm, dry, no rash or lesions.  LABS:  PULMONARY  Recent Labs Lab  08/17/13 2025 08/17/13 2158  PHART 7.181* 7.324*  PCO2ART 59.6* 44.3  PO2ART 82.1 91.1  HCO3 21.4 22.4  TCO2 19.9 20.2  O2SAT 91.3 95.2    CBC  Recent Labs Lab 08/17/13 2050  HGB 15.2  HCT 44.6  WBC 15.8*  PLT 282    COAGULATION  Recent Labs Lab 08/17/13 2050  INR 1.19    CARDIAC   Recent Labs Lab 08/17/13 2050 08/18/13 0220  TROPONINI <0.30 1.41*    Recent Labs Lab 08/17/13 2050  PROBNP 5197.0*     CHEMISTRY  Recent Labs Lab 08/17/13 2050 08/18/13 0220  NA 138 141  K 4.3 4.2  CL 99 106  CO2 19 23  GLUCOSE 366* 144*  BUN 24* 23  CREATININE 1.09 0.96  CALCIUM 8.1* 8.1*  MG  --  1.7  PHOS  --  3.7   Estimated Creatinine Clearance: 79.3 ml/min (by C-G formula based on Cr of 0.96).   LIVER  Recent Labs Lab 08/17/13 2050  INR 1.19     INFECTIOUS  Recent Labs Lab 08/17/13 2027 08/18/13 0220  LATICACIDVEN 5.1* 2.1     ENDOCRINE CBG (last 3)   Recent Labs  08/18/13 0138 08/18/13 0420 08/18/13 0752  GLUCAP 153* 109* 124*         IMAGING x48h  Ct Angio Chest Pe W/cm &/or Wo Cm  08/17/2013   CLINICAL DATA:  Ventilator dependent respiratory failure. Severe CHF noted on portable chest examination earlier tonight.  EXAM: CT ANGIOGRAPHY CHEST WITH CONTRAST  TECHNIQUE: Multidetector CT imaging of the chest was performed using the standard protocol during bolus administration of intravenous contrast. Multiplanar CT image reconstructions and MIPs were obtained to evaluate the vascular anatomy.  CONTRAST:  OMNIPAQUE IOHEXOL 350 MG/ML IV.  COMPARISON:  No prior CT.  Portable chest x-ray earlier same date.  FINDINGS: Endotracheal tube tip is just above the carina with the patient completely supine and should be withdrawn 3-4 cm.  Contrast opacification of the pulmonary arteries is good. No filling defects within either main pulmonary artery or their branches in either lung to suggest pulmonary embolism. Heart enlarged with left  ventricular enlargement and borderline left ventricular hypertrophy. Moderate to severe 3 vessel coronary atherosclerosis. Prior CABG. Calcification involving the more superior of the left coronary graft. The middle of the 3 grafts is either very small or potentially occluded (though I cannot state this with certainty as the contrast opacification of the ascending thoracic aorta is not optimized). Moderate to severe atherosclerosis involving the thoracic and upper abdominal aorta without aneurysm or dissection.  Interstitial and airspace pulmonary edema in both lungs. Large bilateral pleural effusions, right greater than left. Dense consolidation with air bronchograms in the lower lobes and in the upper lobes. Central airways patent.  Numerous normal sized lymph nodes throughout  the mediastinum, and in both hila, and in the axillae. No nodal masses. Visualized thyroid gland unremarkable.  Visualized upper abdomen unremarkable for the early arterial phase of enhancement. Bone window images demonstrate mild thoracic spondylosis.  Review of the MIP images confirms the above findings.  IMPRESSION: 1. No evidence of pulmonary embolism. 2. The middle of the 3 coronary bypass grafts is either diminutive or occluded. The other 2 grafts appear patent. Please note that the ascending thoracic aorta was not optimally opacified. 3. Interstitial and airspace pulmonary edema. 4. Dense consolidation with air bronchograms in the lower lobes and in the upper lobes bilaterally, consistent with either dense passive atelectasis related to the large bilateral pleural effusions or potentially pneumonia. 5. Endotracheal tube tip just above the carina with the patient supine. It should be withdrawn approximately 3-4 cm. These results were called by telephone at the time of interpretation on 08/17/2013 at 10:38 PM to Dr. Jaci Carrel , who verbally acknowledged these results.   Electronically Signed   By: Hulan Saas M.D.   On:  08/17/2013 22:41   Dg Chest Port 1 View  08/18/2013   CLINICAL DATA:  Respiratory failure  EXAM: PORTABLE CHEST - 1 VIEW  COMPARISON:  08/17/2013  FINDINGS: Endotracheal tube in good position.  NG tube enters the stomach.  Improvement in diffuse bilateral edema. Mild edema remains. Mild bibasilar atelectasis. Cardiac enlargement.  IMPRESSION: Improving bilateral pulmonary edema.   Electronically Signed   By: Marlan Palau M.D.   On: 08/18/2013 07:35   Dg Chest Port 1 View  08/17/2013   CLINICAL DATA:  Respiratory respiratory distress. ET tube placement.  EXAM: PORTABLE CHEST - 1 VIEW  COMPARISON:  None.  FINDINGS: NG tube is in place and courses into the stomach and below the inferior margin of the film. Endotracheal tube is also in place with the tip approximately 2.6 cm above the carina. There is marked cardiomegaly and extensive bilateral pulmonary edema. No pneumothorax or pleural effusion.  IMPRESSION: ETT tip is approximately 2.6 cm above the carina.  Cardiomegaly and extensive bilateral pulmonary edema.   Electronically Signed   By: Drusilla Kanner M.D.   On: 08/17/2013 20:14      ASSESSMENT / PLAN:  PULMONARY  A: Vent dependent respiratory failure - 2/2 pulm edema  Acute Pulmonary Edema - 2/2 afib with RVR  And ? NSTEMI  08/18/13: Staff MD rounds at 9am:  on prn sedation. Doing SBT.   P:  - SBT as tolerated -continue full vent support  -extubate when able -check CXR in AM   CARDIOVASCULAR  A: CAD - s/p CABG "many years ago" . CT angio 08/17/13: showing blockaged in CABG vessel H/o Hypertension - BP currently low/normal    Afib with RVR CHADS 2 - new this admission. Etiology unclear, though likely 2/2 ischemic heart disease. Evaluating for ACS vs hyperthyroidism vs electrolyte abnormalities vs valvular disease  Acute Pulm Edema - new this admission  08/18/13 staff MD rounds at 9am: hypotensive with diprivan - did not tolerate. NSTEMI on labs. HR 65 and off dilt drip since  arrivan. CXR with impriving pulm edema  P:  Diurese with lasix bid Await echo chagne sq heparin to IV heparin Daily aspirin Cards reconsulted esp in view of blocked CABG vessel on CT chest  -RENAL  A: No acute issues except low mag P:  - replete mag -follow BMET, Mg   GASTROINTESTINAL  A: Nutrition  P:  -NPO  -if not extubated tomorrow  08/19/13, consider tube feeds -Rx Protonix     HEMATOLOGIC  A:Nil acute. At risk for anemia of critical illness P:  -follow CBC  - PRBC for hgb goal > 8gm%  INFECTIOUS  A: No acute issues  P:   ENDOCRINE  A: DM2  P:  -hold glipizide, metformin  -start SSI  -check A1C   NEUROLOGIC  A: Anxiety - on home xanax  08/18/13 9am STaff MD rounds: Prn sedation. RASS -2 and cam-icu nega for delirum. We had stopped his versed gtt several hours agop  P:  -prn fentanyl - xanax prn via tube to mimic home anxiety  TODAY'S sTAFF MD SUMMARY: Primary issue appears cardiac A FIb RVR, Acute pulm edema and NSTEMI. Cards input needed before extubation decsion. Extubate after good diuresis as well; possibly 08/18/13 or more likely 08/19/13    I have personally obtained a history, examined the patient, evaluated laboratory and imaging results, formulated the assessment and plan and placed orders. CRITICAL CARE: The patient is critically ill with multiple organ systems failure and requires high complexity decision making for assessment and support, frequent evaluation and titration of therapies, application of advanced monitoring technologies and extensive interpretation of multiple databases. Critical Care Time devoted to patient care services described in this note is 45 minutes.   Inocente Salles, MD, PGY-1 08/18/2013, 1:00 AM   STAFF NOTE  - see my comments along resident notes Dr. Kalman Shan, M.D., [facility].C.P Pulmonary and Critical Care Medicine Staff Physician [facility] [place] [facility] Pager: [phone], If no  answer or between  15:00h - 7:00h: call [phone]  08/18/2013 9:19 AM

## 2013-08-18 NOTE — Progress Notes (Signed)
Utilization review completed. Disney Ruggiero, RN, BSN. 

## 2013-08-18 NOTE — Progress Notes (Signed)
    S:  CTSP 2/2 tachycardia with intermittent WCT.  Pt remains intubated.  In no acute distress.  Hemodynamically stable.  O:   Filed Vitals:   08/18/13 1600  BP: 151/77  Pulse: 104  Temp:   Resp: 18   Pleasant, intubated but awake.  Able to answer yes/no questions.  Lungs: basilar crackles - sounds better than this morning. cor: irreg, tachy.  A/P:  1. Afib RVR:  Review of tele and 12 lead show afib with intermittent aberrancy, similar to what was seen on ecg @ [facility].  Amio was initially resumed by CCM but after review, we have opted to discontinue.  Will switch from coreg to lopressor 5mg  iv q 6h while he is intubated.  His pressure is much better than it was this morning.  A bmet was drawn and is pending.  2.  NSTEMI:  As per previous plan, cath Monday.  No chest pain.  Trop now 3.  3.  Acute pulmonary edema/combined systolic/diastolic chf: -975 today.  Nicolasa Ducking, NP

## 2013-08-18 NOTE — Progress Notes (Signed)
  Amiodarone Drug - Drug Interaction Consult Note  Recommendations: No significant interactions identified that require immediate intervention.  Potential interactions with his current medications are identified below.  Amiodarone is metabolized by the cytochrome P450 system and therefore has the potential to cause many drug interactions. Amiodarone has an average plasma half-life of 50 days (range 20 to 100 days).   There is potential for drug interactions to occur several weeks or months after stopping treatment and the onset of drug interactions may be slow after initiating amiodarone.   [x]  Statins: Increased risk of myopathy. Simvastatin- restrict dose to 20mg  daily. Other statins: counsel patients to report any muscle pain or weakness immediately.  []  Anticoagulants: Amiodarone can increase anticoagulant effect. Consider warfarin dose reduction. Patients should be monitored closely and the dose of anticoagulant altered accordingly, remembering that amiodarone levels take several weeks to stabilize.  []  Antiepileptics: Amiodarone can increase plasma concentration of phenytoin, the dose should be reduced. Note that small changes in phenytoin dose can result in large changes in levels. Monitor patient and counsel on signs of toxicity.  [x]  Beta blockers: increased risk of bradycardia, AV block and myocardial depression. Sotalol - avoid concomitant use.  []   Calcium channel blockers (diltiazem and verapamil): increased risk of bradycardia, AV block and myocardial depression.  []   Cyclosporine: Amiodarone increases levels of cyclosporine. Reduced dose of cyclosporine is recommended.  []  Digoxin dose should be halved when amiodarone is started.  [x]  Diuretics: increased risk of cardiotoxicity if hypokalemia occurs.  []  Oral hypoglycemic agents (glyburide, glipizide, glimepiride): increased risk of hypoglycemia. Patient's glucose levels should be monitored closely when initiating amiodarone  therapy.   []  Drugs that prolong the QT interval:  Torsades de pointes risk may be increased with concurrent use - avoid if possible.  Monitor QTc, also keep magnesium/potassium WNL if concurrent therapy can't be avoided. Marland Kitchen Antibiotics: e.g. fluoroquinolones, erythromycin. . Antiarrhythmics: e.g. quinidine, procainamide, disopyramide, sotalol. . Antipsychotics: e.g. phenothiazines, haloperidol.  . Lithium, tricyclic antidepressants, and methadone. Thank You,  Sallee Provencal  08/18/2013 4:52 PM

## 2013-08-18 NOTE — Progress Notes (Signed)
eLink Physician-Brief Progress Note Patient Name: Randy Nunez DOB: Sep 09, 1940 MRN: [id number]  Date of Service  08/18/2013   HPI/Events of Note  Persistent vtach   eICU Interventions  Amiodarone drip/bolus Cardiology f/u asap   Intervention Category Major Interventions: Arrhythmia - evaluation and management  Shan Levans 08/18/2013, 4:35 PM

## 2013-08-18 NOTE — Progress Notes (Signed)
  Echocardiogram 2D Echocardiogram has been performed.  Aceyn Kathol FRANCES 08/18/2013, 12:08 PM

## 2013-08-18 NOTE — Progress Notes (Signed)
Patient Name: Randy Nunez Date of Encounter: 08/18/2013   Principal Problem:   Atrial fibrillation with RVR Active Problems:   Acute pulmonary edema   Acute respiratory failure   Acute CHF   CAD (coronary artery disease)   Diabetes mellitus, type 2   Essential hypertension, benign   NSTEMI (non-ST elevated myocardial infarction)   Hyperlipidemia   SUBJECTIVE  Remains intubated.  Awake and able to answer yes/no questions.  Follows commands.  Able to communicate that he has not seen a cardiologist since his cabg but does have a pcp.  Has not had chest pain and became sob 2-3 days ago.  Unaware of any prior h/o afib/irreg HB, though he is on both high dose dilt and bb @ home. CXR this AM looks better.  CURRENT MEDS . antiseptic oral rinse  15 mL Mouth Rinse QID  . aspirin  325 mg Per Tube Daily  . atorvastatin  80 mg Per Tube q1800  . chlorhexidine  15 mL Mouth Rinse BID  . furosemide  40 mg Intravenous Q12H  . heparin  3,000 Units Intravenous Once  . insulin aspart  0-9 Units Subcutaneous Q4H  . pantoprazole (PROTONIX) IV  40 mg Intravenous Daily  . sodium chloride  3 mL Intravenous Q12H   . heparin       OBJECTIVE  Filed Vitals:   08/18/13 0800 08/18/13 0815 08/18/13 0830 08/18/13 0845  BP: 88/50  97/52 100/49  Pulse: 57 72 60 63  Temp:      TempSrc:      Resp: 16 21 17 17   Height:      Weight:      SpO2: 100% 100% 100% 97%    Intake/Output Summary (Last 24 hours) at 08/18/13 0946 Last data filed at 08/18/13 0900  Gross per 24 hour  Intake 332.15 ml  Output    375 ml  Net -42.85 ml   Filed Weights   08/18/13 0100  Weight: 215 lb 14.4 oz (97.932 kg)   PHYSICAL EXAM  General: Pleasant, intubated but awake. No acute distress. Neuro: Moves all extremities spontaneously.  Follows commands. Psych: flat affect. HEENT:  Normal  Neck: Supple without bruits, mod elevated jvd. Lungs:  Resp regular and unlabored, bibasilar crackles. Heart: ir ir, no s3, s4,  or murmurs. Abdomen: Soft, non-tender, non-distended, BS + x 4.  Extremities: No clubbing, cyanosis 1+ bilat LE edema, r slightly worse than left. DP/PT/Radials 2+ and equal bilaterally.  Accessory Clinical Findings  CBC  Recent Labs  08/17/13 2050  WBC 15.8*  NEUTROABS 7.7  HGB 15.2  HCT 44.6  MCV 92.3  PLT 282   Basic Metabolic Panel  Recent Labs  08/17/13 2050 08/18/13 0220  NA 138 141  K 4.3 4.2  CL 99 106  CO2 19 23  GLUCOSE 366* 144*  BUN 24* 23  CREATININE 1.09 0.96  CALCIUM 8.1* 8.1*  MG  --  1.7  PHOS  --  3.7   Cardiac Enzymes  Recent Labs  08/17/13 2050 08/18/13 0220  TROPONINI <0.30 1.41*   Fasting Lipid Panel  Recent Labs  08/18/13 0220  TRIG 76   TELE  Afib, now rated controlled in the 70's.  ECG  Afib, 90, lbbb/abberancy resolved.  Radiology/Studies  Ct Angio Chest Pe W/cm &/or Wo Cm  08/17/2013   CLINICAL DATA:  Ventilator dependent respiratory failure. Severe CHF noted on portable chest examination earlier tonight.  EXAM: CT ANGIOGRAPHY CHEST WITH CONTRAST  IMPRESSION: 1.  No evidence of pulmonary embolism. 2. The middle of the 3 coronary bypass grafts is either diminutive or occluded. The other 2 grafts appear patent. Please note that the ascending thoracic aorta was not optimally opacified. 3. Interstitial and airspace pulmonary edema. 4. Dense consolidation with air bronchograms in the lower lobes and in the upper lobes bilaterally, consistent with either dense passive atelectasis related to the large bilateral pleural effusions or potentially pneumonia. 5. Endotracheal tube tip just above the carina with the patient supine. It should be withdrawn approximately 3-4 cm. These results were called by telephone at the time of interpretation on 08/17/2013 at 10:38 PM to Dr. Jaci CarrelHRISTOPHER POLLINA , who verbally acknowledged these results.   Electronically Signed   By: Hulan Saashomas  Lawrence M.D.   On: 08/17/2013 22:41   Dg Chest Port 1  View  08/18/2013   CLINICAL DATA:  Respiratory failure  EXAM: PORTABLE CHEST - 1 VIEW  COMPARISON:  08/17/2013  FINDINGS: Endotracheal tube in good position.  NG tube enters the stomach.  Improvement in diffuse bilateral edema. Mild edema remains. Mild bibasilar atelectasis. Cardiac enlargement.  IMPRESSION: Improving bilateral pulmonary edema.   Electronically Signed   By: Marlan Palauharles  Clark M.D.   On: 08/18/2013 07:35    ASSESSMENT AND PLAN  1.  Afib RVR:  Difficult to know if this was the inciting factor to his dyspnea and pulmonary edema or if tachycardia was secondary to it.  He is apparently unaware of any prior h/o afib, though he is on high dose dilt and metoprolol @ home.  His rate rapidly improved with dilt/amio (looks like this was never given according to med summary), to the point that both have been discontinued and he remains rate controlled in the 70's as he's diuresing.  Echo today.  TSH pending.  K ok.  CHA2DS2VASc = 5.  He will require longterm oral anticoagulation if not otherwise contraindicated once we have completed ischemic work up.  When BP stable, would resume bb therapy.  Cont heparin.  2.  Acute Pulmonary Edema/Acute CHF:  ? Systolic vs diastolic.  Echo today.  Cont IV diuresis.  He was on bb/acei @ home.  Plan to resume as BP allows.  3.  NSTEMI/CAD: h/o CABG.  CT suggests 1/3 grafts occluded.  Unclear if this is chronic/new.  Pt has not been having c/p.  Has not seen cardiologist since cabg.  Will likely pursue cath once respiratory issues/chf clears.  Cont asa/statin.  Resume bb once pressure stable.  Cont heparin.  4.  HTN:  Stable to soft.  5.  HL:  Cont statin.  Check lipids/lfts.  6.  DM:  SSI.  Was on actos/glipizide/metformin @ home.  Signed, Nicolasa Duckinghristopher Berge NP   The patient was seen, examined and discussed with Saralyn Pilarhris Berger, NP and agree as above.  In summary, a 73 y.o. male w/ PMHx significant for CAD s/p CABG 2001, HTN, DM2 presenting with [facility] with pulmonary edema requiring urgent intubation for respiratory distress. [facility] and admitted to Pulm/Critical care. He was found to be in a-fib in RVR and NSTEMI (uptrending troponin).  The patient is currently stable, we would recommend to diurese aggressively, extubate and plan for a cath on Monday. Follow troponin and ECG trend, continue ASA, high dose atorvastatin, add low dose BB, iv heparin.   The patient was started on diltiazem and amiodarone for a-fib with RVR at [facility] and developed bradycardia, currently discontinued. As soon as he  is not bradycardic we will start low dose BB. He is currently on Heparin drip for NSTEMI, however long term anticoagultion would have to be determined prior to discharge (CHADS-VASc) is 5.    Tobias Alexander, Rexene Edison 08/18/2013

## 2013-08-18 NOTE — Progress Notes (Signed)
Patient in a sustained bradycardic rhythm in the 40s-50s.  Pola Corn MD notified.  No further orders at this time.  Will continue to monitor patient closely.

## 2013-08-18 NOTE — Progress Notes (Addendum)
INITIAL NUTRITION ASSESSMENT  DOCUMENTATION CODES Per approved criteria  -Obesity Unspecified   INTERVENTION:  If EN started, recommend Vital High Protein formula -- initiate at 20 ml/hr and increase by 10 ml in 4 hours to goal rate of 30 ml/hr with Prostat liquid protein 60 ml TID via tube to provide 1320 kcals (67% of estimated kcal needs), 153 gm protein (100% of estimated protein needs), 602 ml of free water RD to follow for nutrition care plan  NUTRITION DIAGNOSIS: Inadequate oral intake related to inability to eat as evidenced by NPO status   Goal: Pt to meet >/= 90% of their estimated nutrition needs   Monitor:  EN initiation & tolerance, respiratory status, weight, labs, I/O's  Reason for Assessment: VDRF  73 y.o. male  Admitting Dx: Atrial fibrillation with RVR  ASSESSMENT: Patient with PMH of DM2, HTN, CAD s/p CABG (2001), HTN, HLD; [facility] with SOB and was found to be in Atrial Fib with RVR; intubated and started on Cardizem drip; [facility].  Patient is currently intubated on ventilator support -- OGT in place MV: 10.8 L/min Temp (24hrs), Avg:97.8 F (36.6 C), Min:96.9 F (36.1 C), Max:98.6 F (37 C)   Cardiology note reviewed -- patient currently stable, plan for aggressive diuresis and cardiac cath when able.  Height: Ht Readings from Last 1 Encounters:  08/18/13 5\' 10"  (1.778 m)    Weight: Wt Readings from Last 1 Encounters:  08/18/13 215 lb 14.4 oz (97.932 kg)    Ideal Body Weight: 166 lb  % Ideal Body Weight: 129%  Wt Readings from Last 10 Encounters:  08/18/13 215 lb 14.4 oz (97.932 kg)    Usual Body Weight: unable to obtain  % Usual Body Weight: ---  BMI:  Body mass index is 30.98 kg/(m^2).  Estimated Nutritional Needs: Kcal: 1950 Protein: 150-160 gm Fluid: per MD  Skin: small abrasions to upper back  Diet Order: NPO  EDUCATION NEEDS: -No education needs identified at this  time   Intake/Output Summary (Last 24 hours) at 08/18/13 1456 Last data filed at 08/18/13 1200  Gross per 24 hour  Intake 405.15 ml  Output    900 ml  Net -494.85 ml    Labs:   Recent Labs Lab 08/17/13 2050 08/18/13 0220  NA 138 141  K 4.3 4.2  CL 99 106  CO2 19 23  BUN 24* 23  CREATININE 1.09 0.96  CALCIUM 8.1* 8.1*  MG  --  1.7  PHOS  --  3.7  GLUCOSE 366* 144*    CBG (last 3)   Recent Labs  08/18/13 0420 08/18/13 0752 08/18/13 1135  GLUCAP 109* 124* 112*    Scheduled Meds: . antiseptic oral rinse  15 mL Mouth Rinse QID  . aspirin  325 mg Per Tube Daily  . atorvastatin  80 mg Per Tube q1800  . carvedilol  3.125 mg Oral BID WC  . chlorhexidine  15 mL Mouth Rinse BID  . furosemide  80 mg Intravenous Q12H  . insulin aspart  0-9 Units Subcutaneous Q4H  . pantoprazole (PROTONIX) IV  40 mg Intravenous Daily  . sodium chloride  3 mL Intravenous Q12H    Continuous Infusions: . heparin 1,300 Units/hr (08/18/13 1200)    Past Medical History  Diagnosis Date  . Diabetes mellitus without complication   . Hypertension   . CAD (coronary artery disease)     a. s/p CABG 2001.    Past Surgical History  Procedure  Laterality Date  . Coronary artery bypass graft      Maureen Chatters, RD, LDN Pager #: [phone] After-Hours Pager #: [phone]

## 2013-08-18 NOTE — Progress Notes (Signed)
CRITICAL VALUE ALERT  Critical value received:  Troponin 1.41  Date of notification:  08/18/2013  Time of notification:  0344  Critical value read back: YES  Nurse who received alert:  Devona Konig  MD notified (1st page):  [phone]  Responding MD:  Pola Corn RN to relay message to [facility] MD  Time MD responded:  [phone]

## 2013-08-18 NOTE — Progress Notes (Signed)
eLink Physician-Brief Progress Note Patient Name: Randy Nunez DOB: 1941/05/13 MRN: [id number]  Date of Service  08/18/2013   HPI/Events of Note   Runs of Vtach  eICU Interventions  chk BMET   Intervention Category Major Interventions: Arrhythmia - evaluation and management  Shan Levans 08/18/2013, 4:13 PM

## 2013-08-18 NOTE — Consult Note (Signed)
CARDIOLOGY CONSULT NOTE  Patient ID: Randy Nunez, MRN: [id number], DOB/AGE: 10-03-1940 73 y.o. Admit date: 08/17/2013 Date of Consult: 08/18/2013  Primary Physician: No PCP Per Patient Primary Cardiologist:   Chief Complaint: shortness of breath Reason for Consultation: cardiac history, afib with RVR, concern regarding CT findings of graft occlusion  HPI: 73 y.o. male w/ PMHx significant for CAD s/p CABG 2001, HTN, DM2 presenting with initially to [facility] with shortness of breath and was subsequently urgently intubated for respiratory distress. Transferred to [facility] and admitted to Pulm/Critical care. Information is limited as his chart is minimal and no family members are available for history.   Transferred to [facility] via [facility], treated with bipap enroute. Per notes, he had recent pneumonia. In the ER, was found to be in afib with RVR, rates in the 180s. Due to respiratory distress, was intubated. Given amiodarone and diltiazem for afib with RVR. Started on diltiazem gtt which was stopped soon after arrival at [facility] due to bradycardia. PE protocol CT was negative for thrombus but showed LV hypertrophy, 2 patent grafts and a 3rd without luminal contrast, mod severe coronary and thoracic atheroscleroisis and interstitial and pleural effusions.  Initial 12 lead shows irregular, irregular, tachycardic LBBB pattern at 168. Currently, afib with rate of 90, septal q's and flattened T waves/inversions laterally. No baseline for comparison.  Past Medical History  Diagnosis Date  . Diabetes mellitus without complication   . Hypertension   . Heart disease       Surgical History:  Past Surgical History  Procedure Laterality Date  . Coronary artery bypass graft       Home Meds: Prior to Admission medications   Medication Sig Start Date End Date Taking? Authorizing Provider  ALPRAZolam Prudy Feeler) 0.5 MG tablet Take 0.5 mg by mouth daily as needed for anxiety.   Yes Historical  Provider, MD  diltiazem (TIAZAC) 360 MG 24 hr capsule Take 360 mg by mouth daily.   Yes Historical Provider, MD  furosemide (LASIX) 20 MG tablet Take 20 mg by mouth daily.   Yes Historical Provider, MD  glipiZIDE-metformin (METAGLIP) 5-500 MG per tablet Take 2 tablets by mouth 2 (two) times daily.   Yes Historical Provider, MD  lisinopril (PRINIVIL,ZESTRIL) 30 MG tablet Take 30 mg by mouth daily.   Yes Historical Provider, MD  metoprolol (LOPRESSOR) 100 MG tablet Take 100 mg by mouth 2 (two) times daily.   Yes Historical Provider, MD  pioglitazone (ACTOS) 30 MG tablet Take 30 mg by mouth daily.   Yes Historical Provider, MD  pravastatin (PRAVACHOL) 40 MG tablet Take 40 mg by mouth at bedtime.   Yes Historical Provider, MD    Inpatient Medications:  . antiseptic oral rinse  15 mL Mouth Rinse QID  . atorvastatin  80 mg Per Tube q1800  . chlorhexidine  15 mL Mouth Rinse BID  . heparin  5,000 Units Subcutaneous Q8H  . insulin aspart  0-9 Units Subcutaneous Q4H  . pantoprazole (PROTONIX) IV  40 mg Intravenous Daily  . sodium chloride  3 mL Intravenous Q12H   . sodium chloride 75 mL/hr at 08/18/13 0200  . propofol 10 mcg/kg/min (08/18/13 0200)    Allergies:  Allergies  Allergen Reactions  . Penicillins     History   Social History  . Marital Status: Single    Spouse Name: N/A    Number of Children: N/A  . Years of Education: N/A   Occupational History  . Not  on file.   Social History Main Topics  . Smoking status: Never Smoker   . Smokeless tobacco: Not on file  . Alcohol Use: No  . Drug Use: No  . Sexual Activity: Not on file   Other Topics Concern  . Not on file   Social History Narrative  . No narrative on file     No family history on file.   Review of Systems: Unable to complete as pt intubated and sedated.   Labs:  Recent Labs  08/17/13 2050  TROPONINI <0.30   Lab Results  Component Value Date   WBC 15.8* 08/17/2013   HGB 15.2 08/17/2013   HCT 44.6  08/17/2013   MCV 92.3 08/17/2013   PLT 282 08/17/2013    Recent Labs Lab 08/17/13 2050  NA 138  K 4.3  CL 99  CO2 19  BUN 24*  CREATININE 1.09  CALCIUM 8.1*  GLUCOSE 366*   No results found for this basename: CHOL, HDL, LDLCALC, TRIG   No results found for this basename: DDIMER    Radiology/Studies:  Ct Angio Chest Pe W/cm &/or Wo Cm  08/17/2013   CLINICAL DATA:  Ventilator dependent respiratory failure. Severe CHF noted on portable chest examination earlier tonight.  EXAM: CT ANGIOGRAPHY CHEST WITH CONTRAST  TECHNIQUE: Multidetector CT imaging of the chest was performed using the standard protocol during bolus administration of intravenous contrast. Multiplanar CT image reconstructions and MIPs were obtained to evaluate the vascular anatomy.  CONTRAST:  OMNIPAQUE IOHEXOL 350 MG/ML IV.  COMPARISON:  No prior CT.  Portable chest x-ray earlier same date.  FINDINGS: Endotracheal tube tip is just above the carina with the patient completely supine and should be withdrawn 3-4 cm.  Contrast opacification of the pulmonary arteries is good. No filling defects within either main pulmonary artery or their branches in either lung to suggest pulmonary embolism. Heart enlarged with left ventricular enlargement and borderline left ventricular hypertrophy. Moderate to severe 3 vessel coronary atherosclerosis. Prior CABG. Calcification involving the more superior of the left coronary graft. The middle of the 3 grafts is either very small or potentially occluded (though I cannot state this with certainty as the contrast opacification of the ascending thoracic aorta is not optimized). Moderate to severe atherosclerosis involving the thoracic and upper abdominal aorta without aneurysm or dissection.  Interstitial and airspace pulmonary edema in both lungs. Large bilateral pleural effusions, right greater than left. Dense consolidation with air bronchograms in the lower lobes and in the upper lobes. Central  airways patent.  Numerous normal sized lymph nodes throughout the mediastinum, and in both hila, and in the axillae. No nodal masses. Visualized thyroid gland unremarkable.  Visualized upper abdomen unremarkable for the early arterial phase of enhancement. Bone window images demonstrate mild thoracic spondylosis.  Review of the MIP images confirms the above findings.  IMPRESSION: 1. No evidence of pulmonary embolism. 2. The middle of the 3 coronary bypass grafts is either diminutive or occluded. The other 2 grafts appear patent. Please note that the ascending thoracic aorta was not optimally opacified. 3. Interstitial and airspace pulmonary edema. 4. Dense consolidation with air bronchograms in the lower lobes and in the upper lobes bilaterally, consistent with either dense passive atelectasis related to the large bilateral pleural effusions or potentially pneumonia. 5. Endotracheal tube tip just above the carina with the patient supine. It should be withdrawn approximately 3-4 cm. These results were called by telephone at the time of interpretation on 08/17/2013 at 10:38  PM to Dr. Jaci CarrelHRISTOPHER POLLINA , who verbally acknowledged these results.   Electronically Signed   By: Hulan Saashomas  Lawrence M.D.   On: 08/17/2013 22:41   Dg Chest Port 1 View  08/17/2013   CLINICAL DATA:  Respiratory respiratory distress. ET tube placement.  EXAM: PORTABLE CHEST - 1 VIEW  COMPARISON:  None.  FINDINGS: NG tube is in place and courses into the stomach and below the inferior margin of the film. Endotracheal tube is also in place with the tip approximately 2.6 cm above the carina. There is marked cardiomegaly and extensive bilateral pulmonary edema. No pneumothorax or pleural effusion.  IMPRESSION: ETT tip is approximately 2.6 cm above the carina.  Cardiomegaly and extensive bilateral pulmonary edema.   Electronically Signed   By: Drusilla Kannerhomas  Dalessio M.D.   On: 08/17/2013 20:14    EKG:  afib with rate of 90, septal q's and flattened T  waves/inversions laterally. No baseline for comparison.  Physical Exam: Blood pressure 96/54, pulse 70, temperature 97.4 F (36.3 C), temperature source Axillary, resp. rate 16, weight 97.932 kg (215 lb 14.4 oz), SpO2 100.00%. General: intubated and sedated Head: Normocephalic, atraumatic, sclera non-icteric, no xanthomas, nares are without discharge.  Neck: Supple.  JVD appears to be ~15 cm at 60 deg Lungs: decreased at bases bilaterally Heart: RRR with S1 S2. No murmurs, rubs, or gallops appreciated. Abdomen: Soft, non-tender, non-distended with normoactive bowel sounds. No hepatomegaly. No rebound/guarding. No obvious abdominal masses. Msk: unable to test. Extremities: cool. +1-2 bilateral edema Neuro: sedated.    Problem List 1. Respiratory distress, likely secondary to fluid overload in the setting of afib with RVR 2. Congestive heart failure, acute on chronic, EF not known 3. 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 Dm2 5. CAD s/p CABG, 2/3 grafts suggested as function by CT 6. HTN  Assessment and Plan:  73 y.o. male w/ PMHx significant for CAD s/p CABG 2001, HTN, DM2 presenting with initially to [facility] with shortness of breath and was subsequently urgently intubated for respiratory distress. [facility] and admitted to Pulm/Critical care. Information is limited as his chart is minimal and no family members are available for history.   His respiratory distress is at least in part to his fluid overload evident on exam and CT due to congestive heart failure. Unclear if he carries this diagnosis already (on ACEI, lasix and BB which suggests that it might not be a new diagnosis) but it was likely acutely worsened by the afib with RVR (interestingly, his aberrency is a left bundle rather than the more common right bundle). Appears that he would benefit from diuresis, goal of 1-2 liters/day.  Regarding his afib with RVR, further evaluation with TSH and echo recommended.  CHADS2 score of at least 4 given his age, diabetes, HTN, and congestive heart failure. Likely he will be appropriate for long term anticoagulation but this can be deferred in the short term while still gathering information. Rate is controlled off dilt. Likely can be controlled with beta blocker.  Regarding his ischemic regimen, recommend continuing aspirin, statin, beta blocker and ACEI if BP allows. Serial enzymes are reasonable but a slight bump in troponins is to be expected given the RVR and respiratory distress.   Any outside records (recent echo, stress, cath, cardiology notes) would also be helpful given the paucity of information that we know about Mr. Larita FifeLynn.  Summary of Recommendations: -continue aspirin, statin, beta blocker -stop pioglitazone indefinitely given his congestive heart failure -consider  empiric converage for pneumonia given recent history of PNA and elevated WBC - recommend net negative (took the liberty to d/c IV fluids), IV diuresis recommended (noted hypercarbia though) -transthoracic echo in the AM to evaluate EF, valvular causes of afib  Thank you for this consult. We will follow up. Please call with any question.   Signed, Adolm Joseph, Cleaven Demario C. MD 08/18/2013, 2:35 AM

## 2013-08-18 NOTE — Progress Notes (Signed)
Wasted 20 mL Versed in sink with Ron Agee, RN.

## 2013-08-18 NOTE — Progress Notes (Signed)
ANTICOAGULATION CONSULT NOTE - Initial Consult  Pharmacy Consult for heparin Indication: atrial fibrillation  Allergies  Allergen Reactions  . Penicillins     Patient Measurements: Height: 5\' 10"  (177.8 cm) Weight: 215 lb 14.4 oz (97.932 kg) IBW/kg (Calculated) : 73 Heparin Dosing Weight: 93.2kg  Vital Signs: Temp: 98.6 F (37 C) (01/16 0750) Temp src: Oral (01/16 0750) BP: 100/49 mmHg (01/16 0845) Pulse Rate: 63 (01/16 0845)  Labs:  Recent Labs  08/17/13 2050 08/18/13 0220  HGB 15.2  --   HCT 44.6  --   PLT 282  --   APTT 31  --   LABPROT 14.8  --   INR 1.19  --   CREATININE 1.09 0.96  TROPONINI <0.30 1.41*    Estimated Creatinine Clearance: 79.3 ml/min (by C-G formula based on Cr of 0.96).   Medical History: Past Medical History  Diagnosis Date  . Diabetes mellitus without complication   . Hypertension   . Heart disease    Medications:  Infusions:  . heparin     Assessment: 72 yom presented to the hospital with SOB and afib. To start IV heparin for anticoagulation. He is not on any oral anticoagulants PTA. Baseline CBC and INR are WNL. Pt did receive a dose of SQ heparin this morning.   Goal of Therapy:  Heparin level 0.3-0.7 units/ml Monitor platelets by anticoagulation protocol: Yes   Plan:  1. Heparin bolus 3000 units IV x 1 (small bolus since pt received SQ heparin this AM) 2. Heparin gtt 1300 units/hr 3. Check an 8 hour heparin level 4. Daily heparin level and CBC 5. F/u oral anticoagulation plans  Rane Blitch, Isao Granado 08/18/2013,9:27 AM

## 2013-08-19 ENCOUNTER — Inpatient Hospital Stay (HOSPITAL_COMMUNITY): Payer: Medicare Other

## 2013-08-19 DIAGNOSIS — I509 Heart failure, unspecified: Secondary | ICD-10-CM

## 2013-08-19 DIAGNOSIS — J811 Chronic pulmonary edema: Secondary | ICD-10-CM

## 2013-08-19 LAB — GLUCOSE, CAPILLARY
GLUCOSE-CAPILLARY: 131 mg/dL — AB (ref 70–99)
GLUCOSE-CAPILLARY: 154 mg/dL — AB (ref 70–99)
Glucose-Capillary: 114 mg/dL — ABNORMAL HIGH (ref 70–99)
Glucose-Capillary: 115 mg/dL — ABNORMAL HIGH (ref 70–99)
Glucose-Capillary: 151 mg/dL — ABNORMAL HIGH (ref 70–99)
Glucose-Capillary: 115 mg/dL — ABNORMAL HIGH (ref 70–99)
Glucose-Capillary: 137 mg/dL — ABNORMAL HIGH (ref 70–99)

## 2013-08-19 LAB — CBC WITH DIFFERENTIAL/PLATELET
Basophils Absolute: 0.1 10*3/uL (ref 0.0–0.1)
Basophils Relative: 0 % (ref 0–1)
EOS ABS: 0.1 10*3/uL (ref 0.0–0.7)
Eosinophils Relative: 1 % (ref 0–5)
HCT: 39.6 % (ref 39.0–52.0)
HEMOGLOBIN: 13.3 g/dL (ref 13.0–17.0)
Lymphocytes Relative: 23 % (ref 12–46)
Lymphs Abs: 3.2 10*3/uL (ref 0.7–4.0)
MCH: 30.4 pg (ref 26.0–34.0)
MCHC: 33.6 g/dL (ref 30.0–36.0)
MCV: 90.6 fL (ref 78.0–100.0)
MONOS PCT: 10 % (ref 3–12)
Monocytes Absolute: 1.3 10*3/uL — ABNORMAL HIGH (ref 0.1–1.0)
NEUTROS ABS: 9.1 10*3/uL — AB (ref 1.7–7.7)
NEUTROS PCT: 66 % (ref 43–77)
PLATELETS: 218 10*3/uL (ref 150–400)
RBC: 4.37 MIL/uL (ref 4.22–5.81)
RDW: 14.4 % (ref 11.5–15.5)
WBC: 13.7 10*3/uL — ABNORMAL HIGH (ref 4.0–10.5)

## 2013-08-19 LAB — BASIC METABOLIC PANEL
BUN: 17 mg/dL (ref 6–23)
CALCIUM: 8.3 mg/dL — AB (ref 8.4–10.5)
CO2: 27 mEq/L (ref 19–32)
Chloride: 101 mEq/L (ref 96–112)
Creatinine, Ser: 0.81 mg/dL (ref 0.50–1.35)
GFR, EST NON AFRICAN AMERICAN: 85 mL/min — AB (ref >90)
GLUCOSE: 130 mg/dL — AB (ref 70–99)
Potassium: 3 mEq/L — ABNORMAL LOW (ref 3.7–5.3)
SODIUM: 143 meq/L (ref 137–147)

## 2013-08-19 LAB — MAGNESIUM: Magnesium: 1.5 mg/dL (ref 1.5–2.5)

## 2013-08-19 LAB — PHOSPHORUS: Phosphorus: 2.6 mg/dL (ref 2.3–4.6)

## 2013-08-19 LAB — HEPARIN LEVEL (UNFRACTIONATED): HEPARIN UNFRACTIONATED: 0.45 [IU]/mL (ref 0.30–0.70)

## 2013-08-19 MED ORDER — MAGNESIUM SULFATE 40 MG/ML IJ SOLN
2.0000 g | Freq: Once | INTRAMUSCULAR | Status: AC
  Administered 2013-08-19: 2 g via INTRAVENOUS
  Filled 2013-08-19: qty 50

## 2013-08-19 MED ORDER — FUROSEMIDE 10 MG/ML IJ SOLN
40.0000 mg | Freq: Three times a day (TID) | INTRAMUSCULAR | Status: AC
  Administered 2013-08-19 (×2): 40 mg via INTRAVENOUS
  Filled 2013-08-19 (×2): qty 4

## 2013-08-19 MED ORDER — METOPROLOL TARTRATE 25 MG PO TABS
25.0000 mg | ORAL_TABLET | Freq: Four times a day (QID) | ORAL | Status: DC
Start: 2013-08-19 — End: 2013-08-20
  Administered 2013-08-19 – 2013-08-20 (×4): 25 mg via ORAL
  Filled 2013-08-19 (×8): qty 1

## 2013-08-19 MED ORDER — PANTOPRAZOLE SODIUM 40 MG PO TBEC
40.0000 mg | DELAYED_RELEASE_TABLET | Freq: Every day | ORAL | Status: DC
  Administered 2013-08-19 – 2013-08-23 (×5): 40 mg via ORAL
  Filled 2013-08-19 (×5): qty 1

## 2013-08-19 MED ORDER — POTASSIUM CHLORIDE 20 MEQ/15ML (10%) PO LIQD
30.0000 meq | ORAL | Status: DC
  Administered 2013-08-19: 30 meq
  Filled 2013-08-19 (×2): qty 22.5

## 2013-08-19 MED ORDER — POTASSIUM CHLORIDE 20 MEQ/15ML (10%) PO LIQD
ORAL | Status: AC
  Filled 2013-08-19: qty 15

## 2013-08-19 MED ORDER — POTASSIUM CHLORIDE CRYS ER 20 MEQ PO TBCR
20.0000 meq | EXTENDED_RELEASE_TABLET | ORAL | Status: AC
  Administered 2013-08-19 (×2): 20 meq via ORAL
  Filled 2013-08-19 (×2): qty 1

## 2013-08-19 MED ORDER — INFLUENZA VAC SPLIT QUAD 0.5 ML IM SUSP
0.5000 mL | INTRAMUSCULAR | Status: AC
  Administered 2013-08-20: 0.5 mL via INTRAMUSCULAR
  Filled 2013-08-19: qty 0.5

## 2013-08-19 MED ORDER — POTASSIUM PHOSPHATE DIBASIC 3 MMOLE/ML IV SOLN
30.0000 mmol | Freq: Once | INTRAVENOUS | Status: AC
  Administered 2013-08-19: 30 mmol via INTRAVENOUS
  Filled 2013-08-19: qty 10

## 2013-08-19 NOTE — Progress Notes (Signed)
[facility] ADULT ICU REPLACEMENT PROTOCOL FOR AM LAB REPLACEMENT ONLY  The patient does apply for the [facility] Adult ICU Electrolyte Replacment Protocol based on the criteria listed below:   1. Is GFR >/= 40 ml/min? yes  Patient's GFR today is 85 2. Is urine output >/= 0.5 ml/kg/hr for the last 6 hours? yes Patient's UOP is 1.02 ml/kg/hr 3. Is BUN < 60 mg/dL? yes  Patient's BUN today is 17 4. Abnormal electrolyte(s): K+3.0 5. Ordered repletion with: protocol 6. If a panic level lab has been reported, has the CCM MD in charge been notified? yes.   Physician:  Edd Arbour Hilliard 08/19/2013 5:55 AM

## 2013-08-19 NOTE — H&P (Signed)
Name: Randy Nunez MRN: [id number] DOB: 20-Oct-1940    ADMISSION DATE:  08/17/2013 CONSULTATION DATE:  08/18/2013  REFERRING MD :  Gilda Crease, MD. PRIMARY SERVICE: [facility]  CHIEF COMPLAINT:  SOB  BRIEF PATIENT DESCRIPTION: 73 y o Male , PMH of DM2, HTN, CAD S/p CABG (2001)- as per family, HTN, HLD . Presented today at at [facility], with SOB, was found to be in Atria Fib with RVR,  was intubated and started on Cardizem drip.   SIGNIFICANT EVENTS / STUDIES:  Transferred from [facility]- 08/17/2013  LINES / TUBES: ETT- 08/17/2013  CULTURES: None  ANTIBIOTICS: None  HISTORY OF PRESENT ILLNESS:  24 y o Male with PMH of DM, HTN, HLD, CAD S/p CABG- 2001. Pt currently sedated, so could not obtain a history. Hx gotten from sister. Patient was working in his house today When he suddenly became short of breath. No known complaints of chest pain, no pedal edema, no complaints of cough or fever. Pt called the ambulance and was [facility] by EMS [facility]. Chest xray done showed- Pulmonary edema, and pt was found to be in Atria Fib. Cardizem drip was started, and Patient was intubated due to difficulty breathing. CTA chest was done to rule out PE- Which was negative, but showed that one of the three Coronary artery bypass grafts might be occluded, but troponin X1 Negative. He was also placed on IV versed for sedation.  PAST MEDICAL HISTORY :  Past Medical History  Diagnosis Date  . Diabetes mellitus without complication   . Hypertension   . CAD (coronary artery disease)     a. s/p CABG 2001.   Past Surgical History  Procedure Laterality Date  . Coronary artery bypass graft     Prior to Admission medications   Medication Sig Start Date End Date Taking? Authorizing Provider  ALPRAZolam Prudy Feeler) 0.5 MG tablet Take 0.5 mg by mouth daily as needed for anxiety.   Yes Historical Provider, MD  diltiazem (TIAZAC) 360 MG 24 hr capsule Take 360 mg by mouth daily.   Yes  Historical Provider, MD  furosemide (LASIX) 20 MG tablet Take 20 mg by mouth daily.   Yes Historical Provider, MD  glipiZIDE-metformin (METAGLIP) 5-500 MG per tablet Take 2 tablets by mouth 2 (two) times daily.   Yes Historical Provider, MD  lisinopril (PRINIVIL,ZESTRIL) 30 MG tablet Take 30 mg by mouth daily.   Yes Historical Provider, MD  metoprolol (LOPRESSOR) 100 MG tablet Take 100 mg by mouth 2 (two) times daily.   Yes Historical Provider, MD  pioglitazone (ACTOS) 30 MG tablet Take 30 mg by mouth daily.   Yes Historical Provider, MD  pravastatin (PRAVACHOL) 40 MG tablet Take 40 mg by mouth at bedtime.   Yes Historical Provider, MD   Allergies  Allergen Reactions  . Penicillins     FAMILY HISTORY:  No family history on file. SOCIAL HISTORY:  reports that he has never smoked. He does not have any smokeless tobacco history on file. He reports that he does not drink alcohol or use illicit drugs.  REVIEW OF SYSTEMS:  Could not be obtained- Pt sedated.  VITAL SIGNS: Temp:  [97.7 F (36.5 C)-99.6 F (37.6 C)] 97.9 F (36.6 C) (01/17 0808) Pulse Rate:  [51-111] 94 (01/17 0823) Resp:  [15-23] 19 (01/17 0823) BP: (97-168)/(49-126) 127/84 mmHg (01/17 0823) SpO2:  [93 %-100 %] 98 % (01/17 0823) FiO2 (%):  [40 %-50 %] 40 % (01/17 0823)  Weight:  [92.3 kg (203 lb 7.8 oz)] 92.3 kg (203 lb 7.8 oz) (01/17 0400) HEMODYNAMICS:   VENTILATOR SETTINGS: Vent Mode:  [-] PRVC FiO2 (%):  [40 %-50 %] 40 % Set Rate:  [16 bmp] 16 bmp Vt Set:  [600 mL] 600 mL PEEP:  [5 cmH20] 5 cmH20 Pressure Support:  [5 cmH20-10 cmH20] 10 cmH20 Plateau Pressure:  [17 cmH20-24 cmH20] 17 cmH20 INTAKE / OUTPUT: Intake/Output     01/16 0701 - 01/17 0700 01/17 0701 - 01/18 0700   I.V. (mL/kg) 280 (3) 13 (0.1)   NG/GT 180    Total Intake(mL/kg) 460 (5) 13 (0.1)   Urine (mL/kg/hr) 4010 (1.8) 40 (0.3)   Total Output 4010 40   Net -3550 -27          PHYSICAL EXAMINATION: General: Awake, ETT in place. Neuro:   Alert and interactive, following commands. HEENT:  NCAT, PERRL, EOM-I and MMM. Cardiovascular:  S1, S2, irregularly irreg, no murmurs, rubs of gallops. Lungs:  Coarse breath sounds, but present bilaterally. Abdomen:  Soft, not distended, bowel sounds reduced. Musculoskeletal:  No pedal edema. Skin: Warm, dry, no rash or lesions.  LABS:  PULMONARY  Recent Labs Lab 08/17/13 2025 08/17/13 2158  PHART 7.181* 7.324*  PCO2ART 59.6* 44.3  PO2ART 82.1 91.1  HCO3 21.4 22.4  TCO2 19.9 20.2  O2SAT 91.3 95.2    CBC  Recent Labs Lab 08/17/13 2050 08/19/13 0253  HGB 15.2 13.3  HCT 44.6 39.6  WBC 15.8* 13.7*  PLT 282 218    COAGULATION  Recent Labs Lab 08/17/13 2050  INR 1.19    CARDIAC    Recent Labs Lab 08/17/13 2050 08/18/13 0220 08/18/13 0924  TROPONINI <0.30 1.41* 3.09*    Recent Labs Lab 08/17/13 2050  PROBNP 5197.0*     CHEMISTRY  Recent Labs Lab 08/17/13 2050 08/18/13 0220 08/18/13 1641 08/19/13 0253  NA 138 141 143 143  K 4.3 4.2 4.0 3.0*  CL 99 106 105 101  CO2 19 23 24 27   GLUCOSE 366* 144* 110* 130*  BUN 24* 23 22 17   CREATININE 1.09 0.96 0.95 0.81  CALCIUM 8.1* 8.1* 8.2* 8.3*  MG  --  1.7  --  1.5  PHOS  --  3.7  --  2.6   Estimated Creatinine Clearance: 91.3 ml/min (by C-G formula based on Cr of 0.81).   LIVER  Recent Labs Lab 08/17/13 2050  INR 1.19     INFECTIOUS  Recent Labs Lab 08/17/13 2027 08/18/13 0220  LATICACIDVEN 5.1* 2.1     ENDOCRINE CBG (last 3)   Recent Labs  08/18/13 2002 08/18/13 2349 08/19/13 0448  GLUCAP 127* 131* 114*         IMAGING x48h  Ct Angio Chest Pe W/cm &/or Wo Cm  08/17/2013   CLINICAL DATA:  Ventilator dependent respiratory failure. Severe CHF noted on portable chest examination earlier tonight.  EXAM: CT ANGIOGRAPHY CHEST WITH CONTRAST  TECHNIQUE: Multidetector CT imaging of the chest was performed using the standard protocol during bolus administration of  intravenous contrast. Multiplanar CT image reconstructions and MIPs were obtained to evaluate the vascular anatomy.  CONTRAST:  100mL OMNIPAQUE IOHEXOL 350 MG/ML IV.  COMPARISON:  No prior CT.  Portable chest x-ray earlier same date.  FINDINGS: Endotracheal tube tip is just above the carina with the patient completely supine and should be withdrawn 3-4 cm.  Contrast opacification of the pulmonary arteries is good. No filling defects within either main pulmonary artery  or their branches in either lung to suggest pulmonary embolism. Heart enlarged with left ventricular enlargement and borderline left ventricular hypertrophy. Moderate to severe 3 vessel coronary atherosclerosis. Prior CABG. Calcification involving the more superior of the left coronary graft. The middle of the 3 grafts is either very small or potentially occluded (though I cannot state this with certainty as the contrast opacification of the ascending thoracic aorta is not optimized). Moderate to severe atherosclerosis involving the thoracic and upper abdominal aorta without aneurysm or dissection.  Interstitial and airspace pulmonary edema in both lungs. Large bilateral pleural effusions, right greater than left. Dense consolidation with air bronchograms in the lower lobes and in the upper lobes. Central airways patent.  Numerous normal sized lymph nodes throughout the mediastinum, and in both hila, and in the axillae. No nodal masses. Visualized thyroid gland unremarkable.  Visualized upper abdomen unremarkable for the early arterial phase of enhancement. Bone window images demonstrate mild thoracic spondylosis.  Review of the MIP images confirms the above findings.  IMPRESSION: 1. No evidence of pulmonary embolism. 2. The middle of the 3 coronary bypass grafts is either diminutive or occluded. The other 2 grafts appear patent. Please note that the ascending thoracic aorta was not optimally opacified. 3. Interstitial and airspace pulmonary edema. 4.  Dense consolidation with air bronchograms in the lower lobes and in the upper lobes bilaterally, consistent with either dense passive atelectasis related to the large bilateral pleural effusions or potentially pneumonia. 5. Endotracheal tube tip just above the carina with the patient supine. It should be withdrawn approximately 3-4 cm. These results were called by telephone at the time of interpretation on 08/17/2013 at 10:38 PM to Dr. Jaci Carrel , who verbally acknowledged these results.   Electronically Signed   By: Hulan Saas M.D.   On: 08/17/2013 22:41   Dg Chest Port 1 View  08/18/2013   CLINICAL DATA:  Respiratory failure  EXAM: PORTABLE CHEST - 1 VIEW  COMPARISON:  08/17/2013  FINDINGS: Endotracheal tube in good position.  NG tube enters the stomach.  Improvement in diffuse bilateral edema. Mild edema remains. Mild bibasilar atelectasis. Cardiac enlargement.  IMPRESSION: Improving bilateral pulmonary edema.   Electronically Signed   By: Marlan Palau M.D.   On: 08/18/2013 07:35   Dg Chest Port 1 View  08/17/2013   CLINICAL DATA:  Respiratory respiratory distress. ET tube placement.  EXAM: PORTABLE CHEST - 1 VIEW  COMPARISON:  None.  FINDINGS: NG tube is in place and courses into the stomach and below the inferior margin of the film. Endotracheal tube is also in place with the tip approximately 2.6 cm above the carina. There is marked cardiomegaly and extensive bilateral pulmonary edema. No pneumothorax or pleural effusion.  IMPRESSION: ETT tip is approximately 2.6 cm above the carina.  Cardiomegaly and extensive bilateral pulmonary edema.   Electronically Signed   By: Drusilla Kanner M.D.   On: 08/17/2013 20:14      ASSESSMENT / PLAN:  PULMONARY  A: Vent dependent respiratory failure - 2/2 pulm edema  Acute Pulmonary Edema - 2/2 afib with RVR  And ? NSTEMI  08/18/13: Staff MD rounds at 9am:  on prn sedation. Doing SBT.   P:  - SBT to extubate today - Diureses as  below.  CARDIOVASCULAR  A: CAD - s/p CABG "many years ago" . CT angio 08/17/13: showing blockaged in CABG vessel H/o Hypertension - BP currently low/normal   Afib with RVR CHADS 2 - new this admission.  Etiology unclear, though likely 2/2 ischemic heart disease. Evaluating for ACS vs hyperthyroidism vs electrolyte abnormalities vs valvular disease  Acute Pulm Edema - new this admission  08/18/13 staff MD rounds at 9am: hypotensive with diprivan - did not tolerate. NSTEMI on labs. HR 65 and off dilt drip since arrivan. CXR with impriving pulm edema  P:  - Lasix as below. - Continue IV heparin for a-fib. - Daily aspirin. - Otherwise per cards.  -RENAL  A: No acute issues except low mag P:  - Replete mag, K and Phos. - Follow BMET, Mg  - Lasix 40 mg IV q8 hours x2 doses. - D/C scheduled lasix.  GASTROINTESTINAL  A: Nutrition  P:  - Start diet once exubated. - Rx Protonix.  HEMATOLOGIC  A:Nil acute. At risk for anemia of critical illness P:  - Follow CBC. - PRBC for hgb goal > 8gm%.  INFECTIOUS  A: No acute issues  P:   ENDOCRINE  A: DM2  P:  - Gold glipizide, metformin while not eating, will consider restarting once tolerating diet. - Cont SSI.  NEUROLOGIC  A: Anxiety - on home xanax  08/18/13 9am STaff MD rounds: Prn sedation. RASS -2 and cam-icu nega for delirum. We had stopped his versed gtt several hours agop  P:  - PRN fentanyl. - Xanax prn via tube to mimic home anxiety.  Will attempt extubation today since tolerated diureses very well.  Will continue lasix at a lower dose and once extubated will start diet.  I have personally obtained a history, examined the patient, evaluated laboratory and imaging results, formulated the assessment and plan and placed orders.  CRITICAL CARE: The patient is critically ill with multiple organ systems failure and requires high complexity decision making for assessment and support, frequent evaluation and titration of  therapies, application of advanced monitoring technologies and extensive interpretation of multiple databases. Critical Care Time devoted to patient care services described in this note is 45 minutes.   Alyson Reedy, M.D. [facility] Pulmonary/Critical Care Medicine. Pager: [phone]. After hours pager: [phone].

## 2013-08-19 NOTE — Plan of Care (Signed)
Problem: Phase II Progression Outcomes Goal: Date pt extubated/weaned off vent 08/19/2013

## 2013-08-19 NOTE — Progress Notes (Signed)
Subjective:  Now extubated, not SOB.  The atrial fibrillation rate is well controlled.  Objective:  Vital Signs in the last 24 hours: BP 126/80  Pulse 99  Temp(Src) 99 F (37.2 C) (Axillary)  Resp 21  Ht 5\' 10"  (1.778 m)  Wt 92.3 kg (203 lb 7.8 oz)  BMI 29.20 kg/m2  SpO2 98%  Physical Exam: Pleasant WM in NAD Lungs:  MIld rales Cardiac:  irregular rhythm, normal S1 and S2, no S3 Abdomen:  Soft, nontender, no masses Extremities:  No edema present, chronic venous insufficiency  Intake/Output from previous day: 01/16 0701 - 01/17 0700 In: 460 [I.V.:280; NG/GT:180] Out: 4010 [Urine:4010]  Weight Filed Weights   08/18/13 0100 08/19/13 0400  Weight: 97.932 kg (215 lb 14.4 oz) 92.3 kg (203 lb 7.8 oz)    Lab Results: Basic Metabolic Panel:  Recent Labs  60/10/93 1641 08/19/13 0253  NA 143 143  K 4.0 3.0*  CL 105 101  CO2 24 27  GLUCOSE 110* 130*  BUN 22 17  CREATININE 0.95 0.81   CBC:  Recent Labs  08/17/13 2050 08/19/13 0253  WBC 15.8* 13.7*  NEUTROABS 7.7 9.1*  HGB 15.2 13.3  HCT 44.6 39.6  MCV 92.3 90.6  PLT 282 218   Cardiac Enzymes:  Recent Labs  08/17/13 2050 08/18/13 0220 08/18/13 0924  TROPONINI <0.30 1.41* 3.09*    Telemetry: Atrial fib with mild increased response  Assessment/Plan: 1. Non STEMI 2. Prior CABG 3. A fib with RVR 4. Pulmonary edema   Rec:  Change to po metoprolol and continue treatment of a fib.  With non STEMI he will need cath prior to d/c esp with suggestion of a graft being occluded.  Darden Palmer  MD [facility] Cardiology  08/19/2013, 2:07 PM

## 2013-08-19 NOTE — Procedures (Signed)
Extubation Procedure Note  Patient Details:   Name: Randy Nunez DOB: 1941-03-11 MRN: [id number]   Airway Documentation:     Evaluation  O2 sats: stable throughout Complications: No apparent complications Patient did tolerate procedure well. Bilateral Breath Sounds: Clear;Diminished Suctioning: Airway Yes  Pt tolerated weaning. Pt positive for cuff leak, extubated to 4L [place]. No stridor or dyspnea after extubation. Pt achieved on IS x 2. RT will continue to monitor.  Armando Gang 08/19/2013, 10:29 AM

## 2013-08-19 NOTE — Progress Notes (Signed)
ANTICOAGULATION CONSULT NOTE - Follow Up Consult  Pharmacy Consult for Heparin Indication: atrial fibrillation  Allergies  Allergen Reactions  . Penicillins     Patient Measurements: Height: 5\' 10"  (177.8 cm) Weight: 203 lb 7.8 oz (92.3 kg) IBW/kg (Calculated) : 73 Heparin Dosing Weight: 93 kg  Vital Signs: Temp: 97.9 F (36.6 C) (01/17 0808) Temp src: Oral (01/17 0808) BP: 148/101 mmHg (01/17 1000) Pulse Rate: 121 (01/17 1000)  Labs:  Recent Labs  08/17/13 2050 08/18/13 0220 08/18/13 0924 08/18/13 1641 08/18/13 1917 08/19/13 0253  HGB 15.2  --   --   --   --  13.3  HCT 44.6  --   --   --   --  39.6  PLT 282  --   --   --   --  218  APTT 31  --   --   --   --   --   LABPROT 14.8  --   --   --   --   --   INR 1.19  --   --   --   --   --   HEPARINUNFRC  --   --   --   --  0.46 0.45  CREATININE 1.09 0.96  --  0.95  --  0.81  TROPONINI <0.30 1.41* 3.09*  --   --   --     Estimated Creatinine Clearance: 91.3 ml/min (by C-G formula based on Cr of 0.81).   Medications:  Infusions:  . heparin 1,300 Units/hr (08/19/13 0700)    Assessment: 73 year old male receiving anticoagulation with Heparin for atrial fibrillation. He has now been therapeutic on heparin 1300 units/hr x2. CBC is stable and no bleeding noted. Plan for cath on Monday.  Goal of Therapy:  Heparin level 0.3-0.7 units/ml Monitor platelets by anticoagulation protocol: Yes   Plan:  1. Continue Heparin at 1300 units/hr 2. Daily Heparin level and CBC 3. Follow for s/s bleeding 4. Follow for long term anticoagulation plans  Dash Cardarelli D. Sharna Gabrys, PharmD, BCPS Clinical Pharmacist Pager: [phone] 08/19/2013 11:05 AM

## 2013-08-20 LAB — CBC WITH DIFFERENTIAL/PLATELET
BASOS PCT: 0 % (ref 0–1)
Basophils Absolute: 0 10*3/uL (ref 0.0–0.1)
EOS ABS: 0.2 10*3/uL (ref 0.0–0.7)
EOS PCT: 1 % (ref 0–5)
HEMATOCRIT: 39.9 % (ref 39.0–52.0)
Hemoglobin: 13.6 g/dL (ref 13.0–17.0)
Lymphocytes Relative: 26 % (ref 12–46)
Lymphs Abs: 3.1 10*3/uL (ref 0.7–4.0)
MCH: 30.8 pg (ref 26.0–34.0)
MCHC: 34.1 g/dL (ref 30.0–36.0)
MCV: 90.3 fL (ref 78.0–100.0)
MONO ABS: 1.1 10*3/uL — AB (ref 0.1–1.0)
Monocytes Relative: 9 % (ref 3–12)
Neutro Abs: 7.8 10*3/uL — ABNORMAL HIGH (ref 1.7–7.7)
Neutrophils Relative %: 64 % (ref 43–77)
Platelets: 213 10*3/uL (ref 150–400)
RBC: 4.42 MIL/uL (ref 4.22–5.81)
RDW: 14 % (ref 11.5–15.5)
WBC: 12.2 10*3/uL — ABNORMAL HIGH (ref 4.0–10.5)

## 2013-08-20 LAB — HEPARIN LEVEL (UNFRACTIONATED): HEPARIN UNFRACTIONATED: 0.44 [IU]/mL (ref 0.30–0.70)

## 2013-08-20 LAB — BASIC METABOLIC PANEL
BUN: 15 mg/dL (ref 6–23)
CO2: 30 meq/L (ref 19–32)
CREATININE: 0.81 mg/dL (ref 0.50–1.35)
Calcium: 8.4 mg/dL (ref 8.4–10.5)
Chloride: 99 mEq/L (ref 96–112)
GFR calc Af Amer: >90 mL/min (ref >90)
GFR calc non Af Amer: 85 mL/min — ABNORMAL LOW (ref >90)
Glucose, Bld: 122 mg/dL — ABNORMAL HIGH (ref 70–99)
Potassium: 4 mEq/L (ref 3.7–5.3)
Sodium: 142 mEq/L (ref 137–147)

## 2013-08-20 LAB — GLUCOSE, CAPILLARY
GLUCOSE-CAPILLARY: 102 mg/dL — AB (ref 70–99)
GLUCOSE-CAPILLARY: 134 mg/dL — AB (ref 70–99)
GLUCOSE-CAPILLARY: 94 mg/dL (ref 70–99)
Glucose-Capillary: 109 mg/dL — ABNORMAL HIGH (ref 70–99)

## 2013-08-20 LAB — PHOSPHORUS: PHOSPHORUS: 2.8 mg/dL (ref 2.3–4.6)

## 2013-08-20 LAB — MAGNESIUM: MAGNESIUM: 1.8 mg/dL (ref 1.5–2.5)

## 2013-08-20 MED ORDER — FUROSEMIDE 40 MG PO TABS
40.0000 mg | ORAL_TABLET | Freq: Two times a day (BID) | ORAL | Status: DC
  Administered 2013-08-20 – 2013-08-23 (×7): 40 mg via ORAL
  Filled 2013-08-20 (×11): qty 1

## 2013-08-20 MED ORDER — METOPROLOL TARTRATE 50 MG PO TABS
50.0000 mg | ORAL_TABLET | Freq: Three times a day (TID) | ORAL | Status: DC
  Administered 2013-08-20 – 2013-08-22 (×5): 50 mg via ORAL
  Filled 2013-08-20 (×7): qty 1
  Filled 2013-08-20: qty 2
  Filled 2013-08-20 (×2): qty 1

## 2013-08-20 MED ORDER — INSULIN ASPART 100 UNIT/ML ~~LOC~~ SOLN
0.0000 [IU] | Freq: Three times a day (TID) | SUBCUTANEOUS | Status: DC
  Administered 2013-08-20: 1 [IU] via SUBCUTANEOUS
  Administered 2013-08-21: 2 [IU] via SUBCUTANEOUS
  Administered 2013-08-22 (×2): 1 [IU] via SUBCUTANEOUS
  Administered 2013-08-23: 2 [IU] via SUBCUTANEOUS

## 2013-08-20 MED ORDER — PNEUMOCOCCAL VAC POLYVALENT 25 MCG/0.5ML IJ INJ
0.5000 mL | INJECTION | INTRAMUSCULAR | Status: DC
  Filled 2013-08-20: qty 0.5

## 2013-08-20 MED ORDER — HEPARIN BOLUS VIA INFUSION
3000.0000 [IU] | Freq: Once | INTRAVENOUS | Status: AC
  Administered 2013-08-20: 3000 [IU] via INTRAVENOUS
  Filled 2013-08-20: qty 3000

## 2013-08-20 MED ORDER — HEPARIN (PORCINE) IN NACL 100-0.45 UNIT/ML-% IJ SOLN
1300.0000 [IU]/h | INTRAMUSCULAR | Status: DC
  Administered 2013-08-20 – 2013-08-21 (×3): 1300 [IU]/h via INTRAVENOUS
  Filled 2013-08-20 (×3): qty 250

## 2013-08-20 NOTE — Progress Notes (Signed)
Subjective:  Not SOB, no chest pain  Objective:  Vital Signs in the last 24 hours: BP 131/59  Pulse 97  Temp(Src) 98.1 F (36.7 C) (Oral)  Resp 21  Ht 5' 10" (1.778 m)  Wt 99.1 kg (218 lb 7.6 oz)  BMI 31.35 kg/m2  SpO2 98%  Physical Exam: Pleasant WM in NAD Lungs:  MIld rales Cardiac:  irregular rhythm, normal S1 and S2, no S3 Abdomen:  Soft, nontender, no masses Extremities:  No edema present, chronic venous insufficiency  Intake/Output from previous day: 01/17 0701 - 01/18 0700 In: 1522 [P.O.:660; I.V.:432; IV Piggyback:430] Out: 3465 [Urine:3465]  Weight Filed Weights   08/18/13 0100 08/19/13 0400 08/20/13 0500  Weight: 97.932 kg (215 lb 14.4 oz) 92.3 kg (203 lb 7.8 oz) 99.1 kg (218 lb 7.6 oz)    Lab Results: Basic Metabolic Panel:  Recent Labs  08/19/13 0253 08/20/13 0301  NA 143 142  K 3.0* 4.0  CL 101 99  CO2 27 30  GLUCOSE 130* 122*  BUN 17 15  CREATININE 0.81 0.81   CBC:  Recent Labs  08/19/13 0253 08/20/13 0301  WBC 13.7* 12.2*  NEUTROABS 9.1* 7.8*  HGB 13.3 13.6  HCT 39.6 39.9  MCV 90.6 90.3  PLT 218 213   Cardiac Enzymes:  Recent Labs  08/17/13 2050 08/18/13 0220 08/18/13 0924  TROPONINI <0.30 1.41* 3.09*    Telemetry: Atrial fib with mild increased response  Assessment/Plan:  1. Non STEMI 2. Prior CABG 3. A fib with RVR improving 4. Pulmonary edema improved  Rec: He will need cardiac cath to eval grafts.  He is agreeable, but is concerned about finances.  He would like to do radial approach.  I explained to him it would need to be left radial and that he might need a groin appproach if grafts were not visualized.  NPO poast midnight.   W. Spencer Nieves Barberi, Jr.  MD FACC Cardiology  08/20/2013, 10:25 AM    

## 2013-08-20 NOTE — Progress Notes (Addendum)
Name: Randy Nunez MRN: [id number] DOB: December 01, 1940    ADMISSION DATE:  08/17/2013 CONSULTATION DATE:  08/18/2013  REFERRING MD :  Gilda Crease, MD. PRIMARY SERVICE: [facility]  CHIEF COMPLAINT:  SOB  BRIEF PATIENT DESCRIPTION: 73 y o Male , PMH of DM2, HTN, CAD S/p CABG (2001)- as per family, HTN, HLD . Presented today at at [facility], with SOB, was found to be in Atria Fib with RVR,  was intubated and started on Cardizem drip.   SIGNIFICANT EVENTS / STUDIES:  Transferred from [facility]- 08/17/2013  LINES / TUBES: ETT- 08/17/2013>>>1/17  CULTURES: None  ANTIBIOTICS: None  VITAL SIGNS: Temp:  [97.9 F (36.6 C)-99 F (37.2 C)] 98.1 F (36.7 C) (01/18 0754) Pulse Rate:  [65-142] 97 (01/18 0900) Resp:  [17-24] 21 (01/18 0900) BP: (107-160)/(54-95) 131/59 mmHg (01/18 0900) SpO2:  [91 %-100 %] 98 % (01/18 0900) Weight:  [99.1 kg (218 lb 7.6 oz)] 99.1 kg (218 lb 7.6 oz) (01/18 0500) HEMODYNAMICS:   VENTILATOR SETTINGS:   INTAKE / OUTPUT: Intake/Output     01/17 0701 - 01/18 0700 01/18 0701 - 01/19 0700   P.O. 660    I.V. (mL/kg) 432 (4.4) 13 (0.1)   NG/GT     IV Piggyback 430    Total Intake(mL/kg) 1522 (15.4) 13 (0.1)   Urine (mL/kg/hr) 3465 (1.5)    Total Output 3465     Net -1943 +13         PHYSICAL EXAMINATION: General: Awake, interactive. Neuro:  Alert and interactive, following commands.  Moving all ext to command HEENT:  NCAT, PERRL, EOM-I and MMM. Cardiovascular:  S1, S2, irregularly irreg, no murmurs, rubs of gallops. Lungs:  Bibasilar rales but otherwise clear. Abdomen:  Soft, not distended, bowel sounds reduced. Musculoskeletal:  No pedal edema. Skin: Warm, dry, no rash or lesions.  LABS:  PULMONARY  Recent Labs Lab 08/17/13 2025 08/17/13 2158  PHART 7.181* 7.324*  PCO2ART 59.6* 44.3  PO2ART 82.1 91.1  HCO3 21.4 22.4  TCO2 19.9 20.2  O2SAT 91.3 95.2    CBC  Recent Labs Lab 08/17/13 2050 08/19/13 0253  08/20/13 0301  HGB 15.2 13.3 13.6  HCT 44.6 39.6 39.9  WBC 15.8* 13.7* 12.2*  PLT 282 218 213    COAGULATION  Recent Labs Lab 08/17/13 2050  INR 1.19    CARDIAC    Recent Labs Lab 08/17/13 2050 08/18/13 0220 08/18/13 0924  TROPONINI <0.30 1.41* 3.09*    Recent Labs Lab 08/17/13 2050  PROBNP 5197.0*     CHEMISTRY  Recent Labs Lab 08/17/13 2050 08/18/13 0220 08/18/13 1641 08/19/13 0253 08/20/13 0301  NA 138 141 143 143 142  K 4.3 4.2 4.0 3.0* 4.0  CL 99 106 105 101 99  CO2 19 23 24 27 30   GLUCOSE 366* 144* 110* 130* 122*  BUN 24* 23 22 17 15   CREATININE 1.09 0.96 0.95 0.81 0.81  CALCIUM 8.1* 8.1* 8.2* 8.3* 8.4  MG  --  1.7  --  1.5 1.8  PHOS  --  3.7  --  2.6 2.8   Estimated Creatinine Clearance: 94.4 ml/min (by C-G formula based on Cr of 0.81).   LIVER  Recent Labs Lab 08/17/13 2050  INR 1.19     INFECTIOUS  Recent Labs Lab 08/17/13 2027 08/18/13 0220  LATICACIDVEN 5.1* 2.1     ENDOCRINE CBG (last 3)   Recent Labs  08/19/13 1957 08/19/13 2322 08/20/13 0330  GLUCAP 137* 154*  102*         IMAGING x48h  Dg Chest Port 1 View  08/19/2013   CLINICAL DATA:  Evaluate endotracheal tube.  EXAM: PORTABLE CHEST - 1 VIEW  COMPARISON:  08/18/2013  FINDINGS: Endotracheal tube is 3.4 cm above the carina. Nasogastric tube is coiled in the stomach. Prominent interstitial densities bilaterally suggest edema and minimally changed. Densities at both lung bases are suggestive for pleural fluid and atelectasis, left side greater than right. Heart size appears to be upper limits of normal. No evidence for a pneumothorax.  IMPRESSION: Increased basilar densities, particularly at the left lung base. Findings may represent a combination of atelectasis and pleural fluid.  Prominent interstitial markings suggest mild edema.  Support apparatuses as described.   Electronically Signed   By: Richarda OverlieAdam  Henn M.D.   On: 08/19/2013 08:36      ASSESSMENT /  PLAN:  PULMONARY  A: Vent dependent respiratory failure - 2/2 pulm edema  Acute Pulmonary Edema - 2/2 afib with RVR  And ? NSTEMI  08/18/13: Staff MD rounds at 9am:  on prn sedation. Doing SBT.   P:  - Titrate O2 down as able. - Pulmonary hygienes. - Diureses as below.  CARDIOVASCULAR  A: CAD - s/p CABG "many years ago" . CT angio 08/17/13: showing blockaged in CABG vessel H/o Hypertension - BP currently low/normal   Afib with RVR CHADS 2 - new this admission. Etiology unclear, though likely 2/2 ischemic heart disease. Evaluating for ACS vs hyperthyroidism vs electrolyte abnormalities vs valvular disease  Acute Pulm Edema - new this admission  08/18/13 staff MD rounds at 9am: hypotensive with diprivan - did not tolerate. NSTEMI on labs. HR 65 and off dilt drip since arrivan. CXR with impriving pulm edema  P:  - Lasix as below. - Continue IV heparin for a-fib. - Daily aspirin. - Otherwise per cards.  -RENAL  A: No acute issues except low mag P:  - Replete mag, K and Phos. - Follow BMET, Mg  - Lasix 40 mg PO BID.  GASTROINTESTINAL  A: Nutrition  P:  - Heart healthy diet. - Rx Protonix.  HEMATOLOGIC  A:Nil acute. At risk for anemia of critical illness P:  - Follow CBC. - PRBC for hgb goal > 8gm%.  INFECTIOUS  A: No acute issues  P:   ENDOCRINE  A: DM2  P:  - Hold glipizide, metformin for now, will need to be restarted once we know can tolerate PO ok. - Cont SSI.  NEUROLOGIC  A: Anxiety - on home xanax   P:  - PRN fentanyl. - Xanax prn via tube to mimic home anxiety.  Extubated, doing well, needs social work to address home situation, cath whenever cards is ready, patient is very concerned about having another CABG since the last time he had multiple complications.  Will transfer to SDU.  Lasix 40 mg PO BID scheduled ordered but again will need to be reassessed prior to discharge.  [facility], PCCM will sign off.  I have personally  obtained a history, examined the patient, evaluated laboratory and imaging results, formulated the assessment and plan and placed orders.  Alyson ReedyWesam G. Yacoub, M.D. [facility] Pulmonary/Critical Care Medicine. Pager: [phone]. After hours pager: [phone].

## 2013-08-20 NOTE — Progress Notes (Signed)
ANTICOAGULATION CONSULT NOTE - Follow Up Consult  Pharmacy Consult for Heparin Indication: atrial fibrillation  Allergies  Allergen Reactions  . Penicillins     Patient Measurements: Height: 5\' 10"  (177.8 cm) Weight: 218 lb 7.6 oz (99.1 kg) IBW/kg (Calculated) : 73 Heparin Dosing Weight: 93 kg  Vital Signs: Temp: 98.1 F (36.7 C) (01/18 0754) Temp src: Oral (01/18 0754) BP: 127/73 mmHg (01/18 0700) Pulse Rate: 94 (01/18 0700)  Labs:  Recent Labs  08/17/13 2050 08/18/13 0220 08/18/13 0924 08/18/13 1641  08/19/13 0253 08/20/13 0301 08/20/13 0742  HGB 15.2  --   --   --   --  13.3 13.6  --   HCT 44.6  --   --   --   --  39.6 39.9  --   PLT 282  --   --   --   --  218 213  --   APTT 31  --   --   --   --   --   --   --   LABPROT 14.8  --   --   --   --   --   --   --   INR 1.19  --   --   --   --   --   --   --   HEPARINUNFRC  --   --   --   --   < > 0.45 <0.10* <0.10*  CREATININE 1.09 0.96  --  0.95  --  0.81 0.81  --   TROPONINI <0.30 1.41* 3.09*  --   --   --   --   --   < > = values in this interval not displayed.  Estimated Creatinine Clearance: 94.4 ml/min (by C-G formula based on Cr of 0.81).   Assessment: 73 year old male receiving anticoagulation with heparin for atrial fibrillation. Overnight, line was out for ~15 minutes, but level became undetectable- since that is very unlikely based on the kinetics of heparin, a level was redrawn. That level was also undetectable. Spoke with RN at 1000 and she told me the line that was replaced overnight did not look very good and she suspects heparin was not infusing well. Noted there was some bleeding when patient pulled out IV line, but none since. CBC has remained stable.  Goal of Therapy:  Heparin level 0.3-0.7 units/ml Monitor platelets by anticoagulation protocol: Yes   Plan:  1.Will bolus with heparin 3000 units, then restart drip at 1300 units/hr 2. Heparin level in 8 hours 3. Daily Heparin level and  CBC 4. Follow for s/s bleeding 5. Follow for long term anticoagulation plans  Joclyn Alsobrook D. Merriam Brandner, PharmD, BCPS Clinical Pharmacist Pager: [phone] 08/20/2013 10:03 AM

## 2013-08-20 NOTE — Progress Notes (Signed)
Pt found sitting on side of bed, off monitor, IV pulled out and bleeding. Pt appeared disoriented, states he is getting up. Pt returned to bed, reoriented and instructed to stay in bed and call for assistance, bed alarm on.  Pt states he doesn't know what happened, he just woke up and didn't know where he was.

## 2013-08-20 NOTE — Progress Notes (Signed)
ANTICOAGULATION CONSULT NOTE - Follow Up Consult  Pharmacy Consult for Heparin Indication: atrial fibrillation  Allergies  Allergen Reactions  . Penicillins     Patient Measurements: Height: 5\' 10"  (177.8 cm) Weight: 218 lb 7.6 oz (99.1 kg) IBW/kg (Calculated) : 73 Heparin Dosing Weight: 93 kg  Vital Signs: Temp: 99 F (37.2 C) (01/18 1954) Temp src: Oral (01/18 1954) BP: 162/90 mmHg (01/18 1800) Pulse Rate: 113 (01/18 1800)  Labs:  Recent Labs  08/18/13 0220 08/18/13 0924 08/18/13 1641  08/19/13 0253 08/20/13 0301 08/20/13 0742 08/20/13 2024  HGB  --   --   --   --  13.3 13.6  --   --   HCT  --   --   --   --  39.6 39.9  --   --   PLT  --   --   --   --  218 213  --   --   HEPARINUNFRC  --   --   --   < > 0.45 <0.10* <0.10* 0.44  CREATININE 0.96  --  0.95  --  0.81 0.81  --   --   TROPONINI 1.41* 3.09*  --   --   --   --   --   --   < > = values in this interval not displayed.  Estimated Creatinine Clearance: 94.4 ml/min (by C-G formula based on Cr of 0.81).  . heparin 1,300 Units/hr (08/20/13 1212)    Assessment: 73 year old male receiving anticoagulation with heparin for atrial fibrillation. Overnight, line was out for ~15 minutes, but level became undetectable- since that is very unlikely based on the kinetics of heparin, a level was redrawn. That level was also undetectable. Spoke with RN at 1000 and she told me the line that was replaced overnight did not look very good and she suspects heparin was not infusing well. Noted there was some bleeding when patient pulled out IV line, but none since. CBC has remained stable.  Follow-up heparin level at goal on 1300 units/hr.  No bleeding or complications noted.  Goal of Therapy:  Heparin level 0.3-0.7 units/ml Monitor platelets by anticoagulation protocol: Yes   Plan:  1. Continue IV heparin at current rate. 2. Daily Heparin level and CBC 3. Follow for s/s bleeding 4. Follow for long term anticoagulation  plans  Tad Moore BCPS  Clinical Pharmacist Pager [phone]  08/20/2013 8:59 PM

## 2013-08-21 ENCOUNTER — Encounter (HOSPITAL_COMMUNITY): Payer: Self-pay | Admitting: Physician Assistant

## 2013-08-21 ENCOUNTER — Encounter (HOSPITAL_COMMUNITY)
Admission: EM | Disposition: A | Discharge status: Home / Self Care | Payer: Self-pay | Source: Home / Self Care | Attending: Internal Medicine

## 2013-08-21 DIAGNOSIS — I2510 Atherosclerotic heart disease of native coronary artery without angina pectoris: Secondary | ICD-10-CM

## 2013-08-21 DIAGNOSIS — I4891 Unspecified atrial fibrillation: Secondary | ICD-10-CM | POA: Diagnosis present

## 2013-08-21 HISTORY — PX: LEFT HEART CATHETERIZATION WITH CORONARY ANGIOGRAM: SHX5451

## 2013-08-21 LAB — HEPATIC FUNCTION PANEL
ALT: 28 U/L (ref 0–53)
AST: 25 U/L (ref 0–37)
Albumin: 3.2 g/dL — ABNORMAL LOW (ref 3.5–5.2)
Alkaline Phosphatase: 71 U/L (ref 39–117)
BILIRUBIN INDIRECT: 1.3 mg/dL — AB (ref 0.3–0.9)
Bilirubin, Direct: 0.2 mg/dL (ref 0.0–0.3)
TOTAL PROTEIN: 6.1 g/dL (ref 6.0–8.3)
Total Bilirubin: 1.5 mg/dL — ABNORMAL HIGH (ref 0.3–1.2)

## 2013-08-21 LAB — CBC WITH DIFFERENTIAL/PLATELET
BASOS ABS: 0.1 10*3/uL (ref 0.0–0.1)
Basophils Relative: 1 % (ref 0–1)
Eosinophils Absolute: 0.4 10*3/uL (ref 0.0–0.7)
Eosinophils Relative: 4 % (ref 0–5)
HEMATOCRIT: 39 % (ref 39.0–52.0)
Hemoglobin: 13.2 g/dL (ref 13.0–17.0)
LYMPHS PCT: 37 % (ref 12–46)
Lymphs Abs: 3.8 10*3/uL (ref 0.7–4.0)
MCH: 30.2 pg (ref 26.0–34.0)
MCHC: 33.8 g/dL (ref 30.0–36.0)
MCV: 89.2 fL (ref 78.0–100.0)
MONO ABS: 0.9 10*3/uL (ref 0.1–1.0)
Monocytes Relative: 9 % (ref 3–12)
NEUTROS ABS: 5.2 10*3/uL (ref 1.7–7.7)
Neutrophils Relative %: 50 % (ref 43–77)
PLATELETS: 221 10*3/uL (ref 150–400)
RBC: 4.37 MIL/uL (ref 4.22–5.81)
RDW: 13.8 % (ref 11.5–15.5)
WBC: 10.4 10*3/uL (ref 4.0–10.5)

## 2013-08-21 LAB — BASIC METABOLIC PANEL
BUN: 17 mg/dL (ref 6–23)
CO2: 28 mEq/L (ref 19–32)
CREATININE: 0.79 mg/dL (ref 0.50–1.35)
Calcium: 8.4 mg/dL (ref 8.4–10.5)
Chloride: 100 mEq/L (ref 96–112)
GFR calc non Af Amer: 86 mL/min — ABNORMAL LOW (ref >90)
Glucose, Bld: 111 mg/dL — ABNORMAL HIGH (ref 70–99)
POTASSIUM: 3.7 meq/L (ref 3.7–5.3)
Sodium: 141 mEq/L (ref 137–147)

## 2013-08-21 LAB — GLUCOSE, CAPILLARY
GLUCOSE-CAPILLARY: 188 mg/dL — AB (ref 70–99)
GLUCOSE-CAPILLARY: 105 mg/dL — AB (ref 70–99)
GLUCOSE-CAPILLARY: 173 mg/dL — AB (ref 70–99)
Glucose-Capillary: 108 mg/dL — ABNORMAL HIGH (ref 70–99)
Glucose-Capillary: 129 mg/dL — ABNORMAL HIGH (ref 70–99)

## 2013-08-21 LAB — PATHOLOGIST SMEAR REVIEW

## 2013-08-21 LAB — PHOSPHORUS: PHOSPHORUS: 3.6 mg/dL (ref 2.3–4.6)

## 2013-08-21 LAB — HEPARIN LEVEL (UNFRACTIONATED): Heparin Unfractionated: 0.37 IU/mL (ref 0.30–0.70)

## 2013-08-21 LAB — MAGNESIUM: MAGNESIUM: 1.6 mg/dL (ref 1.5–2.5)

## 2013-08-21 SURGERY — LEFT HEART CATHETERIZATION WITH CORONARY ANGIOGRAM
Anesthesia: LOCAL

## 2013-08-21 MED ORDER — SODIUM CHLORIDE 0.9 % IV SOLN: 250.0000 mL | INTRAVENOUS | Status: DC | PRN

## 2013-08-21 MED ORDER — ACETAMINOPHEN 325 MG PO TABS: 650.0000 mg | ORAL_TABLET | ORAL | Status: DC | PRN

## 2013-08-21 MED ORDER — LIDOCAINE HCL (PF) 1 % IJ SOLN
INTRAMUSCULAR | Status: AC
  Filled 2013-08-21: qty 30

## 2013-08-21 MED ORDER — VERAPAMIL HCL 2.5 MG/ML IV SOLN
INTRAVENOUS | Status: AC
  Filled 2013-08-21: qty 2

## 2013-08-21 MED ORDER — SODIUM CHLORIDE 0.9 % IJ SOLN: 3.0000 mL | INTRAMUSCULAR | Status: DC | PRN

## 2013-08-21 MED ORDER — ASPIRIN 81 MG PO CHEW
81.0000 mg | CHEWABLE_TABLET | Freq: Every day | ORAL | Status: DC
  Administered 2013-08-22 – 2013-08-23 (×2): 81 mg via ORAL
  Filled 2013-08-21 (×2): qty 1

## 2013-08-21 MED ORDER — SODIUM CHLORIDE 0.9 % IJ SOLN
3.0000 mL | Freq: Two times a day (BID) | INTRAMUSCULAR | Status: DC
  Administered 2013-08-22: 3 mL via INTRAVENOUS

## 2013-08-21 MED ORDER — FENTANYL CITRATE 0.05 MG/ML IJ SOLN
INTRAMUSCULAR | Status: AC
  Filled 2013-08-21: qty 2

## 2013-08-21 MED ORDER — ASPIRIN 81 MG PO CHEW
324.0000 mg | CHEWABLE_TABLET | Freq: Once | ORAL | Status: AC
  Administered 2013-08-21: 324 mg via ORAL
  Filled 2013-08-21: qty 4

## 2013-08-21 MED ORDER — MIDAZOLAM HCL 2 MG/2ML IJ SOLN
INTRAMUSCULAR | Status: AC
  Filled 2013-08-21: qty 2

## 2013-08-21 MED ORDER — ASPIRIN 81 MG PO CHEW: 81.0000 mg | CHEWABLE_TABLET | ORAL | Status: DC

## 2013-08-21 MED ORDER — SODIUM CHLORIDE 0.9 % IJ SOLN
3.0000 mL | Freq: Two times a day (BID) | INTRAMUSCULAR | Status: DC
  Administered 2013-08-21: 3 mL via INTRAVENOUS

## 2013-08-21 MED ORDER — HEPARIN (PORCINE) IN NACL 2-0.9 UNIT/ML-% IJ SOLN
INTRAMUSCULAR | Status: AC
  Filled 2013-08-21: qty 1000

## 2013-08-21 MED ORDER — ONDANSETRON HCL 4 MG/2ML IJ SOLN: 4.0000 mg | Freq: Four times a day (QID) | INTRAMUSCULAR | Status: DC | PRN

## 2013-08-21 MED ORDER — SODIUM CHLORIDE 0.9 % IV SOLN: INTRAVENOUS | Status: AC

## 2013-08-21 MED ORDER — NITROGLYCERIN 0.2 MG/ML ON CALL CATH LAB
INTRAVENOUS | Status: AC
  Filled 2013-08-21: qty 1

## 2013-08-21 MED ORDER — HEPARIN (PORCINE) IN NACL 100-0.45 UNIT/ML-% IJ SOLN
1400.0000 [IU]/h | INTRAMUSCULAR | Status: DC
  Administered 2013-08-21: 1300 [IU]/h via INTRAVENOUS
  Filled 2013-08-21: qty 250

## 2013-08-21 MED ORDER — SODIUM CHLORIDE 0.9 % IV SOLN: INTRAVENOUS | Status: DC

## 2013-08-21 NOTE — H&P (View-Only) (Signed)
Subjective:  Not SOB, no chest pain  Objective:  Vital Signs in the last 24 hours: BP 131/59  Pulse 97  Temp(Src) 98.1 F (36.7 C) (Oral)  Resp 21  Ht 5\' 10"  (1.778 m)  Wt 99.1 kg (218 lb 7.6 oz)  BMI 31.35 kg/m2  SpO2 98%  Physical Exam: Pleasant WM in NAD Lungs:  MIld rales Cardiac:  irregular rhythm, normal S1 and S2, no S3 Abdomen:  Soft, nontender, no masses Extremities:  No edema present, chronic venous insufficiency  Intake/Output from previous day: 01/17 0701 - 01/18 0700 In: 1522 [P.O.:660; I.V.:432; IV Piggyback:430] Out: 3465 [Urine:3465]  Weight Filed Weights   08/18/13 0100 08/19/13 0400 08/20/13 0500  Weight: 97.932 kg (215 lb 14.4 oz) 92.3 kg (203 lb 7.8 oz) 99.1 kg (218 lb 7.6 oz)    Lab Results: Basic Metabolic Panel:  Recent Labs  08/04/70 0253 08/20/13 0301  NA 143 142  K 3.0* 4.0  CL 101 99  CO2 27 30  GLUCOSE 130* 122*  BUN 17 15  CREATININE 0.81 0.81   CBC:  Recent Labs  08/19/13 0253 08/20/13 0301  WBC 13.7* 12.2*  NEUTROABS 9.1* 7.8*  HGB 13.3 13.6  HCT 39.6 39.9  MCV 90.6 90.3  PLT 218 213   Cardiac Enzymes:  Recent Labs  08/17/13 2050 08/18/13 0220 08/18/13 0924  TROPONINI <0.30 1.41* 3.09*    Telemetry: Atrial fib with mild increased response  Assessment/Plan:  1. Non STEMI 2. Prior CABG 3. A fib with RVR improving 4. Pulmonary edema improved  Rec: He will need cardiac cath to eval grafts.  He is agreeable, but is concerned about finances.  He would like to do radial approach.  I explained to him it would need to be left radial and that he might need a groin appproach if grafts were not visualized.  NPO poast midnight.   Randy Palmer  MD [facility]  08/20/2013, 10:25 AM

## 2013-08-21 NOTE — CV Procedure (Signed)
Cardiac Catheterization Procedure Note  Name: Marjie Skiffrnest B Hyams MRN: [id number] DOB: 1941/07/22  Procedure: Left Heart Cath, Selective Coronary Angiography, LV angiography, LIMA angiography, saphenous vein graft angiography.  Indication: Non-ST elevation MI in the setting of critical illness with respiratory failure. He has recovered. The patient has CAD status post CABG. CT angiography of the chest was suggestive of occlusion of one of his bypass grafts. He now presents for cardiac catheterization and possible PCI.   Procedural Details: The left wrist was prepped, draped, and anesthetized with 1% lidocaine. Using the modified Seldinger technique, a 5 French sheath was introduced into the left radial artery. 3 mg of verapamil was administered through the sheath, weight-based unfractionated heparin was administered intravenously. Standard Judkins catheters were used for selective coronary angiography, saphenous vein graft angiography, LIMA angiography, and left ventriculography. Catheter exchanges were performed over an exchange length guidewire. The procedure was challenging because of subclavian artery tortuosity. The left coronary artery was imaged with a 5 JapanFrench JL4 guide catheter. The right coronary artery was imaged nonselectively with a JL 3.5 diagnostic catheter. The bypass grafts were imaged with a multipurpose catheter and an AL-1 catheter. A LIMA guide catheter was used to image the LIMA graft. There were no immediate procedural complications. A TR band was used for radial hemostasis at the completion of the procedure.  The patient was transferred to the post catheterization recovery area for further monitoring.  Procedural Findings: Hemodynamics: AO 126/68 with a mean of 92 LV 124/60  Coronary angiography: Coronary dominance: right  Left mainstem: The left main is moderately calcified. There is 70% mid left main stenosis. The vessel divides into the LAD left circumflex.  Left  anterior descending (LAD): The LAD has 70% proximal stenosis. The vessel is occluded after the first septal perforator. The first 2 diagonal branches have severe diffuse disease with heavy calcification.  Left circumflex (LCx): The AV circumflex is patent. The first and second obtuse marginal branches are totally occluded. There is some competitive filling seen in the first obtuse marginal branch.  Right coronary artery (RCA): The right coronary artery is small in caliber. The vessel appears to occlude in its distal portion.  LIMA to LAD: The vessel is widely patent throughout. The LAD after the insertion site is patent with no significant stenosis.  Saphenous vein graft to diagonal: Totally occluded at the aortic anastomosis.  Saphenous vein graft to PDA: Patent without significant stenosis. The PDA branch is also patent with no significant stenosis.  Saphenous vein graft sequential to OM1 and OM 2. This graft is patent with mild irregularity in the proximal body of the graft involving no more than 30% stenosis.  Left ventriculography: There is severe hypokinesis of the anterolateral wall. The base of the heart in the inferior wall contract normally. The apex is hypokinetic. The estimated left ventricular ejection fraction is 40%.   Final Conclusions:   1. Severe native three-vessel coronary artery disease 2. Status post aortocoronary bypass surgery with continued patency of the LIMA to LAD, saphenous vein graft to PDA, and sequential saphenous vein graft to OM1 and OM 2. 3. Total occlusion of the saphenous vein graft to diagonal 4. Moderate LV systolic dysfunction  Recommendations: Medical therapy for CAD and congestive heart failure. I do not see any options for revascularization. Will reduce aspirin dose to 81 mg daily. Would not use any other antiplatelet therapies as this patient will require long-term anticoagulation in the setting of his atrial fibrillation.  Tonny BollmanMichael  Sarika Baldini  08/21/2013, 1:24 PM

## 2013-08-21 NOTE — Progress Notes (Signed)
Pt transported to Cath Lab, Heparin gtt turned off prior to leaving unit. Pre-cath orders completed. Pt currently resting, vs WNL GCS 15, no complaints of pain. Family with pt upon transport, currently waiting in waiting room. Bedside report given to Greggory Stallion, [place]. Pt safety maintained.

## 2013-08-21 NOTE — Care Management Note (Signed)
    Page 1 of 1   08/22/2013     11:07:58 AM   CARE MANAGEMENT NOTE 08/22/2013  Patient:  Randy Nunez, Randy Nunez   Account Number:  [id number]  Date Initiated:  08/18/2013  Documentation initiated by:  [facility]  Subjective/Objective Assessment:   Admitted with Afib with RVR and resp failure - on vent.     Action/Plan:   Anticipated DC Date:  08/25/2013   Anticipated DC Plan:  HOME W HOME HEALTH SERVICES      DC Planning Services  CM consult  Medication Assistance      Choice offered to / List presented to:             Status of service:  Completed, signed off Medicare Important Message given?   (If response is "NO", the following Medicare IM given date fields will be blank) Date Medicare IM given:   Date Additional Medicare IM given:    Discharge Disposition:    Per UR Regulation:  Reviewed for med. necessity/level of care/duration of stay  If discussed at Long Length of Stay Meetings, dates discussed:    Comments:  [facility] [phone]                 McKinney,Janet Niece [phone]  1/20 1018 debbie Nikkole Placzek rn,bsn would have 30.00 per month copay for xarelto. have cm sec ck on copay for eliquis and if prior auth req.cm sec states 6.00 permonth for eliquis. gave pt 30day free card also.  08-21-13 1:50pm Avie Arenas, RNBSN [phone] Benefits checked and financials confirmed does have active AARP medicare Complete. Unable to verify cost of medications due to  CALL CENTER CLOSED DUE TO HOLIDAY. I WILL LEAVE FOR TOMORROW IF PT DOESNT DC.  08-18-13 9am Avie Arenas, RNBSN [phone] Home alone - independent prior to admission

## 2013-08-21 NOTE — Progress Notes (Signed)
ANTICOAGULATION CONSULT NOTE - Follow Up Consult  Pharmacy Consult for Heparin Indication: atrial fibrillation  Allergies  Allergen Reactions  . Penicillins     Labs:  Recent Labs  08/18/13 0924  08/19/13 0253 08/20/13 0301 08/20/13 0742 08/20/13 2024 08/21/13 0257  HGB  --   < > 13.3 13.6  --   --  13.2  HCT  --   --  39.6 39.9  --   --  39.0  PLT  --   --  218 213  --   --  221  HEPARINUNFRC  --   < > 0.45 <0.10* <0.10* 0.44 0.37  CREATININE  --   < > 0.81 0.81  --   --  0.79  TROPONINI 3.09*  --   --   --   --   --   --   < > = values in this interval not displayed.  Estimated Creatinine Clearance: 95.6 ml/min (by C-G formula based on Cr of 0.79).   Assessment: 73 year old male receiving anticoagulation with heparin for atrial fibrillation. HL therapeutic this AM, CBC stable  Goal of Therapy:  Heparin level 0.3-0.7 units/ml Monitor platelets by anticoagulation protocol: Yes   Plan:  1. Continue heparin at 1300 units / hr 2. Follow up plans for cath  Thank you. Okey Regal, PharmD [phone] 08/21/2013 9:06 AM

## 2013-08-21 NOTE — Progress Notes (Signed)
Progress Note [facility] TEAM 1 - Stepdown/ICU TEAM   Randy Nunez MPN:[id number] DOB: 1941/05/28 DOA: 08/17/2013 PCP: No PCP Per Patient  Admit HPI / Brief Narrative: 73 y o Male , PMH of DM2, HTN, CAD S/p CABG (2001), HLD . Presented to [facility] with SOB, was found to be in Atria Fib with RVR, was intubated and started on Cardizem drip.   SIGNIFICANT EVENTS / STUDIES:  1/15 - [facility]  ETT- 08/17/2013>>>1/17  HPI/Subjective: Resting comfortably.  No compaints.  Wants to go home asap.    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% Well compensated at present   CAD - s/p CABG 2001 CT angio 08/17/13: showing blockaged in CABG vessel - underwent cardiac cath today revealing severe multivessel coronary disease of native vessels but patency of bypass vessels with exception to total occlusion of the saphenous vein graft to diagonal amenable only to medical therapy - Cardiology following  Afib with RVR CHADS 2 new this admission  Hypertension Reasonably well controlled at this time   DM2  No change in tx plan today - follow   Anxiety  on home xanax   Code Status: FULL Family Communication: no family present at time of exam Disposition Plan: As per Cardiology  Consultants: Cardiology  Antibiotics: None  DVT prophylaxis: IV heparin  Objective: Blood pressure 107/90, pulse 88, temperature 98.2 F (36.8 C), temperature source Oral, resp. rate 17, height 5\' 10"  (1.778 m), weight 99 kg (218 lb 4.1 oz), SpO2 96.00%.  Intake/Output Summary (Last 24 hours) at 08/21/13 1834 Last data filed at 08/21/13 1800  Gross per 24 hour  Intake    394 ml  Output   1200 ml  Net   -806 ml   Exam: General: No acute respiratory distress Lungs: Clear to auscultation bilaterally without wheezes or crackles Cardiovascular: Regular rate and rhythm without  murmur gallop or rub normal S1 and S2 Abdomen: Nontender, nondistended, soft, bowel sounds positive, no rebound, no ascites, no appreciable mass Extremities: No significant cyanosis, clubbing, or edema bilateral lower extremities  Data Reviewed: Basic Metabolic Panel:  Recent Labs Lab 08/17/13 2050 08/18/13 0220 08/18/13 1641 08/19/13 0253 08/20/13 0301 08/21/13 0257  NA 138 141 143 143 142 141  K 4.3 4.2 4.0 3.0* 4.0 3.7  CL 99 106 105 101 99 100  CO2 19 23 24 27 30 28   GLUCOSE 366* 144* 110* 130* 122* 111*  BUN 24* 23 22 17 15 17   CREATININE 1.09 0.96 0.95 0.81 0.81 0.79  CALCIUM 8.1* 8.1* 8.2* 8.3* 8.4 8.4  MG  --  1.7  --  1.5 1.8 1.6  PHOS  --  3.7  --  2.6 2.8 3.6   Liver Function Tests:  Recent Labs Lab 08/21/13 0257  AST 25  ALT 28  ALKPHOS 71  BILITOT 1.5*  PROT 6.1  ALBUMIN 3.2*   CBC:  Recent Labs Lab 08/17/13 2050 08/19/13 0253 08/20/13 0301 08/21/13 0257  WBC 15.8* 13.7* 12.2* 10.4  NEUTROABS 7.7 9.1* 7.8* 5.2  HGB 15.2 13.3 13.6 13.2  HCT 44.6 39.6 39.9 39.0  MCV 92.3 90.6 90.3 89.2  PLT 282 218 213 221   Cardiac Enzymes:  Recent Labs Lab 08/17/13 2050 08/18/13 0220 08/18/13 0924  TROPONINI <0.30 1.41* 3.09*   BNP (last 3 results)  Recent Labs  08/17/13 2050  PROBNP 5197.0*  CBG:  Recent Labs Lab 08/20/13 1636 08/20/13 2105 08/21/13 0745 08/21/13 1326 08/21/13 1744  GLUCAP 188* 94 108* 105* 173*    Recent Results (from the past 240 hour(s))  MRSA PCR SCREENING     Status: None   Collection Time    08/18/13 12:26 AM      Result Value Range Status   MRSA by PCR NEGATIVE  NEGATIVE Final   Comment:            The GeneXpert MRSA Assay (FDA     approved for NASAL specimens     only), is one component of a     comprehensive MRSA colonization     surveillance program. It is not     intended to diagnose MRSA     infection nor to guide or     monitor treatment for     MRSA infections.     Studies:  Recent x-ray  studies have been reviewed in detail by the Attending Physician  Scheduled Meds:  Scheduled Meds: . aspirin  81 mg Oral Daily  . atorvastatin  80 mg Per Tube q1800  . furosemide  40 mg Oral BID  . insulin aspart  0-9 Units Subcutaneous TID WC  . metoprolol tartrate  50 mg Oral Q8H  . pantoprazole  40 mg Oral Q1200  . pneumococcal 23 valent vaccine  0.5 mL Intramuscular Tomorrow-1000  . sodium chloride  3 mL Intravenous Q12H  . sodium chloride  3 mL Intravenous Q12H    Time spent on care of this patient: 25 mins   [facility] MichiganMCCLUNG,Derell Bruun T  [facility] Office  [phone] Pager - Text Page per Loretha StaplerAmion as per below:  On-Call/Text Page:      [email or web address]      password [email or web address]  If 7PM-7AM, please contact night-coverage [email or web address] Password [email or web address] 08/21/2013, 6:34 PM   LOS: 4 days

## 2013-08-21 NOTE — Progress Notes (Signed)
CSW received consult to "assess home situation." Patient not currently in room. CSW will try again later.  Maree Krabbe, MSW, Theresia Majors [phone]

## 2013-08-21 NOTE — Progress Notes (Signed)
ANTICOAGULATION CONSULT NOTE - Follow Up Consult  Pharmacy Consult for Heparin Indication: atrial fibrillation  Allergies  Allergen Reactions  . Penicillins     Patient Measurements: Height: 5\' 10"  (177.8 cm) Weight: 218 lb 4.1 oz (99 kg) IBW/kg (Calculated) : 73 Heparin Dosing Weight: 93 kg  Vital Signs: Temp: 98.5 F (36.9 C) (01/19 0752) Temp src: Oral (01/19 0752) BP: 125/98 mmHg (01/19 1100) Pulse Rate: 112 (01/19 1219)  Labs:  Recent Labs  08/19/13 0253 08/20/13 0301 08/20/13 0742 08/20/13 2024 08/21/13 0257  HGB 13.3 13.6  --   --  13.2  HCT 39.6 39.9  --   --  39.0  PLT 218 213  --   --  221  HEPARINUNFRC 0.45 <0.10* <0.10* 0.44 0.37  CREATININE 0.81 0.81  --   --  0.79    Estimated Creatinine Clearance: 95.6 ml/min (by C-G formula based on Cr of 0.79).  . sodium chloride 50 mL/hr (08/21/13 1029)  . heparin      Assessment: 73 year old male receiving anticoagulation with heparin for atrial fibrillation. Overnight, line was out for ~15 minutes, but level became undetectable- since that is very unlikely based on the kinetics of heparin, a level was redrawn. That level was also undetectable. Spoke with RN at 1000 and she told me the line that was replaced overnight did not look very good and she suspects heparin was not infusing well. Noted there was some bleeding when patient pulled out IV line, but none since. CBC has remained stable.  Follow-up heparin level at goal on 1300 units/hr.  No bleeding or complications noted.  Heparin stopped for cath today, pharmacy asked to resume Heparin 6 hrs after sheath removed, was pulled at 13:11 per cath report.    Goal of Therapy:  Heparin level 0.3-0.7 units/ml Monitor platelets by anticoagulation protocol: Yes   Plan:  1. Resume heparin at 1915 PM at previous therapeutic rate of 1300 units/hr. 2. Recheck heparin level 6 hrs after resuming. 3. Daily Heparin level and CBC 4. Follow for s/s bleeding 5. Follow  for long term anticoagulation plans  Tad Moore BCPS  Clinical Pharmacist Pager [phone]  08/21/2013 5:14 PM

## 2013-08-21 NOTE — Interval H&P Note (Signed)
History and Physical Interval Note:  08/21/2013 12:17 PM  Randy Nunez  has presented today for surgery, with the diagnosis of chest pain  The various methods of treatment have been discussed with the patient and family. After consideration of risks, benefits and other options for treatment, the patient has consented to  Procedure(s): LEFT HEART CATHETERIZATION WITH CORONARY ANGIOGRAM (N/A) as a surgical intervention .  The patient's history has been reviewed, patient examined, no change in status, stable for surgery.  I have reviewed the patient's chart and labs.  Questions were answered to the patient's satisfaction.    Cath Lab Visit (complete for each Cath Lab visit)  Clinical Evaluation Leading to the Procedure:   ACS: yes  Non-ACS:    Anginal Classification: CCS IV  Anti-ischemic medical therapy: Minimal Therapy (1 class of medications)  Non-Invasive Test Results: No non-invasive testing performed  Prior CABG: No previous CABG       Tonny Bollman

## 2013-08-21 NOTE — Progress Notes (Signed)
NUTRITION FOLLOW UP  Intervention:   Re-advance diet as medically appropriate, no nutrition interventions warranted at this time RD to follow for nutrition care plan  Nutrition Dx:   Inadequate oral intake related to inability to eat as evidenced by NPO status, ongoing  Goal:   Pt to meet >/= 90% of their estimated nutrition needs, progressing  Monitor:   PO intake, weight, labs, I/O's  Assessment:   Patient with PMH of DM2, HTN, CAD s/p CABG (2001), HTN, HLD; presented to [facility] with SOB and was found to be in Atrial Fib with RVR; intubated and started on Cardizem drip; transferred to [facility]  Patient extubated 1/17.  Advanced to Carbohydrate Modified Medium Calorie post-extubated.  Patient NPO at present for cardiac cath.  Reports he ate well while he was receiving meals (75% per flowsheet records).  RD to continue to monitor PO intake, add supplements as needed.  Height: Ht Readings from Last 1 Encounters:  08/18/13 5\' 10"  (1.778 m)    Weight Status:   Wt Readings from Last 1 Encounters:  08/21/13 218 lb 4.1 oz (99 kg)    Body mass index is 31.32 kg/(m^2).  Re-estimated needs:  Kcal: 2200-2400 Protein: 130-140 gm Fluid: per MD  Skin: small abrasions to upper back  Diet Order: NPO   Intake/Output Summary (Last 24 hours) at 08/21/13 1134 Last data filed at 08/21/13 0900  Gross per 24 hour  Intake    453 ml  Output   1920 ml  Net  -1467 ml    Labs:   Recent Labs Lab 08/19/13 0253 08/20/13 0301 08/21/13 0257  NA 143 142 141  K 3.0* 4.0 3.7  CL 101 99 100  CO2 27 30 28   BUN 17 15 17   CREATININE 0.81 0.81 0.79  CALCIUM 8.3* 8.4 8.4  MG 1.5 1.8 1.6  PHOS 2.6 2.8 3.6  GLUCOSE 130* 122* 111*    CBG (last 3)   Recent Labs  08/20/13 1636 08/20/13 2105 08/21/13 0745  GLUCAP 188* 94 108*    Scheduled Meds: . aspirin  325 mg Per Tube Daily  . atorvastatin  80 mg Per Tube q1800  . furosemide  40 mg Oral BID  . insulin aspart  0-9 Units  Subcutaneous TID WC  . metoprolol tartrate  50 mg Oral Q8H  . pantoprazole  40 mg Oral Q1200  . pneumococcal 23 valent vaccine  0.5 mL Intramuscular Tomorrow-1000  . sodium chloride  3 mL Intravenous Q12H  . sodium chloride  3 mL Intravenous Q12H    Continuous Infusions: . sodium chloride 50 mL/hr (08/21/13 1029)  . heparin 1,300 Units/hr (08/21/13 0839)    Maureen Chatters, RD, LDN Pager #: [phone] After-Hours Pager #: [phone]

## 2013-08-21 NOTE — Progress Notes (Signed)
Patient: Randy Nunez / Admit Date: 08/17/2013 / Date of Encounter: 08/21/2013, 11:06 AM   Subjective  Breathing improved. No orthopnea, LEE. No chest pain.  Objective   Telemetry: atrial fib occasional PVCs rates 80s-100s  Physical Exam: Blood pressure 126/103, pulse 85, temperature 98.5 F (36.9 C), temperature source Oral, resp. rate 15, height 5\' 10"  (1.778 m), weight 218 lb 4.1 oz (99 kg), SpO2 92.00%. General: Well developed, well nourished WM in no acute distress. Sitting up in chair Head: Normocephalic, atraumatic, sclera non-icteric, no xanthomas, nares are without discharge. Neck: Negative for carotid bruits. JVP not elevated. Lungs: Clear bilaterally to auscultation without wheezes, rales, or rhonchi. Breathing is unlabored. Heart: Irreg irregular S1 S2 without murmurs, rubs, or gallops.  Abdomen: Soft, non-tender, non-distended with normoactive bowel sounds. No rebound/guarding. Extremities: No clubbing or cyanosis. No edema. Distal pedal pulses are 2+ and equal bilaterally. Neuro: Alert and oriented X 3. Moves all extremities spontaneously. Psych:  Responds to questions appropriately with a normal affect.   Intake/Output Summary (Last 24 hours) at 08/21/13 1106 Last data filed at 08/21/13 0900  Gross per 24 hour  Intake    466 ml  Output   1960 ml  Net  -1494 ml    Inpatient Medications:  . aspirin  81 mg Oral Pre-Cath  . aspirin  325 mg Per Tube Daily  . atorvastatin  80 mg Per Tube q1800  . furosemide  40 mg Oral BID  . insulin aspart  0-9 Units Subcutaneous TID WC  . metoprolol tartrate  50 mg Oral Q8H  . pantoprazole  40 mg Oral Q1200  . pneumococcal 23 valent vaccine  0.5 mL Intramuscular Tomorrow-1000  . sodium chloride  3 mL Intravenous Q12H  . sodium chloride  3 mL Intravenous Q12H   Infusions:  . sodium chloride 50 mL/hr (08/21/13 1029)  . heparin 1,300 Units/hr (08/21/13 0839)    Labs:  Recent Labs  08/20/13 0301 08/21/13 0257  NA 142 141    K 4.0 3.7  CL 99 100  CO2 30 28  GLUCOSE 122* 111*  BUN 15 17  CREATININE 0.81 0.79  CALCIUM 8.4 8.4  MG 1.8 1.6  PHOS 2.8 3.6    Recent Labs  08/20/13 0301 08/21/13 0257  WBC 12.2* 10.4  NEUTROABS 7.8* 5.2  HGB 13.6 13.2  HCT 39.9 39.0  MCV 90.3 89.2  PLT 213 221    Radiology/Studies:   2D Echo 08/18/13 - Left ventricle: The cavity size was mildly dilated. Wall thickness was increased in a pattern of mild LVH. Systolic function was mildly to moderately reduced. The estimated ejection fraction was in the range of 40% to 45%. Wall motion was normal; there were no regional wall motion abnormalities. - Left atrium: The atrium was moderately dilated. - Right ventricle: The cavity size was moderately dilated. Wall thickness was normal. Systolic function was moderately reduced. - Right atrium: The atrium was moderately dilated. - Pulmonary arteries: Systolic pressure was mildly increased. PA peak pressure: 39mm Hg (S).  Ct Angio Chest Pe W/cm &/or Wo Cm  08/17/2013   CLINICAL DATA:  Ventilator dependent respiratory failure. Severe CHF noted on portable chest examination earlier tonight.  EXAM: CT ANGIOGRAPHY CHEST WITH CONTRAST  TECHNIQUE: Multidetector CT imaging of the chest was performed using the standard protocol during bolus administration of intravenous contrast. Multiplanar CT image reconstructions and MIPs were obtained to evaluate the vascular anatomy.  CONTRAST:  OMNIPAQUE IOHEXOL 350 MG/ML IV.  COMPARISON:  No prior CT.  Portable chest x-ray earlier same date.  FINDINGS: Endotracheal tube tip is just above the carina with the patient completely supine and should be withdrawn 3-4 cm.  Contrast opacification of the pulmonary arteries is good. No filling defects within either main pulmonary artery or their branches in either lung to suggest pulmonary embolism. Heart enlarged with left ventricular enlargement and borderline left ventricular hypertrophy. Moderate to  severe 3 vessel coronary atherosclerosis. Prior CABG. Calcification involving the more superior of the left coronary graft. The middle of the 3 grafts is either very small or potentially occluded (though I cannot state this with certainty as the contrast opacification of the ascending thoracic aorta is not optimized). Moderate to severe atherosclerosis involving the thoracic and upper abdominal aorta without aneurysm or dissection.  Interstitial and airspace pulmonary edema in both lungs. Large bilateral pleural effusions, right greater than left. Dense consolidation with air bronchograms in the lower lobes and in the upper lobes. Central airways patent.  Numerous normal sized lymph nodes throughout the mediastinum, and in both hila, and in the axillae. No nodal masses. Visualized thyroid gland unremarkable.  Visualized upper abdomen unremarkable for the early arterial phase of enhancement. Bone window images demonstrate mild thoracic spondylosis.  Review of the MIP images confirms the above findings.  IMPRESSION: 1. No evidence of pulmonary embolism. 2. The middle of the 3 coronary bypass grafts is either diminutive or occluded. The other 2 grafts appear patent. Please note that the ascending thoracic aorta was not optimally opacified. 3. Interstitial and airspace pulmonary edema. 4. Dense consolidation with air bronchograms in the lower lobes and in the upper lobes bilaterally, consistent with either dense passive atelectasis related to the large bilateral pleural effusions or potentially pneumonia. 5. Endotracheal tube tip just above the carina with the patient supine. It should be withdrawn approximately 3-4 cm. These results were called by telephone at the time of interpretation on 08/17/2013 at 10:38 PM to Dr. Jaci CarrelHRISTOPHER POLLINA , who verbally acknowledged these results.   Electronically Signed   By: Hulan Saashomas  Lawrence M.D.   On: 08/17/2013 22:41   Dg Chest Prt 1 View  08/19/2013   CLINICAL DATA:  Evaluate  endotracheal tube.  EXAM: PORTABLE CHEST - 1 VIEW  COMPARISON:  08/18/2013  FINDINGS: Endotracheal tube is 3.4 cm above the carina. Nasogastric tube is coiled in the stomach. Prominent interstitial densities bilaterally suggest edema and minimally changed. Densities at both lung bases are suggestive for pleural fluid and atelectasis, left side greater than right. Heart size appears to be upper limits of normal. No evidence for a pneumothorax.  IMPRESSION: Increased basilar densities, particularly at the left lung base. Findings may represent a combination of atelectasis and pleural fluid.  Prominent interstitial markings suggest mild edema.  Support apparatuses as described.   Electronically Signed   By: Richarda OverlieAdam  Henn M.D.   On: 08/19/2013 08:36   Dg Chest Port 1 View 08/18/2013   CLINICAL DATA:  Respiratory failure  EXAM: PORTABLE CHEST - 1 VIEW  COMPARISON:  08/17/2013  FINDINGS: Endotracheal tube in good position.  NG tube enters the stomach.  Improvement in diffuse bilateral edema. Mild edema remains. Mild bibasilar atelectasis. Cardiac enlargement.  IMPRESSION: Improving bilateral pulmonary edema.   Electronically Signed   By: Marlan Palauharles  Clark M.D.   On: 08/18/2013 07:35   Dg Chest Port 1 View 08/17/2013   CLINICAL DATA:  Respiratory respiratory distress. ET tube placement.  EXAM: PORTABLE CHEST - 1 VIEW  COMPARISON:  None.  FINDINGS: NG tube is in place and courses into the stomach and below the inferior margin of the film. Endotracheal tube is also in place with the tip approximately 2.6 cm above the carina. There is marked cardiomegaly and extensive bilateral pulmonary edema. No pneumothorax or pleural effusion.  IMPRESSION: ETT tip is approximately 2.6 cm above the carina.  Cardiomegaly and extensive bilateral pulmonary edema.   Electronically Signed   By: Drusilla Kanner M.D.   On: 08/17/2013 20:14     Assessment and Plan  1. CAD s/p CABG 2001, admitted with NSTEMI - CT angio neg for PE, ?1/3 grafts  patent by CT. Continue ASA, statin, beta blocker. Ck baseline LFTs. For cath today. Risks and benefits of cardiac catheterization have been discussed with the patient. These include bleeding, infection, kidney damage, stroke, heart attack, death. The patient understands these risks and is willing to proceed. 2. Acute respiratory failure requiring ventilation - extubated 1/17. Likely due to pulm edema in the setting of cardiac issues. 3. Newly recognized atrial fibrillation with RVR, intermittent WCT c/w abberrancy, also with bradycardia. CHADSVASC = 5. LA 51mm. On heparin. Will need to take this into consideration when deciding on possible DAPT. Care mgmt consult to get the ball rolling on affordability various agents. I don't see any contraindication to NOACs. 4. HTN - variable. Consider resuming low-dose ACEI. 5. Diabetes mellitus - controlled. A1C 6.6. Would hold off Metformin, Actos given CHF and lactic acidosis on admission. Can consider resuming glipizide. 6. 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 As above, consider adding back ACEI.  Signed, Ronie Spies PA-C  Patient seen, examined. Available data reviewed. Agree with findings, assessment, and plan as outlined by Ronie Spies, PA-C. Post-cath will transition to novel anticoagulant drug if feasible from cost standpoint. If not, will use heparin/warfarin. Reviewed risks, indication, alternatives to cath and possible PCI. He understands and agrees to proceed. Exam as outlined above by Ms Shea Evans.  Tonny Bollman, M.D. 08/21/2013 1:22 PM

## 2013-08-22 ENCOUNTER — Inpatient Hospital Stay (HOSPITAL_COMMUNITY): Payer: Medicare Other

## 2013-08-22 DIAGNOSIS — I214 Non-ST elevation (NSTEMI) myocardial infarction: Secondary | ICD-10-CM

## 2013-08-22 DIAGNOSIS — I5043 Acute on chronic combined systolic (congestive) and diastolic (congestive) heart failure: Secondary | ICD-10-CM

## 2013-08-22 LAB — CBC WITH DIFFERENTIAL/PLATELET
BASOS PCT: 1 % (ref 0–1)
Basophils Absolute: 0.1 10*3/uL (ref 0.0–0.1)
EOS ABS: 0.3 10*3/uL (ref 0.0–0.7)
Eosinophils Relative: 3 % (ref 0–5)
HCT: 39.4 % (ref 39.0–52.0)
HEMOGLOBIN: 13.4 g/dL (ref 13.0–17.0)
LYMPHS ABS: 2.7 10*3/uL (ref 0.7–4.0)
Lymphocytes Relative: 31 % (ref 12–46)
MCH: 30.5 pg (ref 26.0–34.0)
MCHC: 34 g/dL (ref 30.0–36.0)
MCV: 89.7 fL (ref 78.0–100.0)
MONO ABS: 0.9 10*3/uL (ref 0.1–1.0)
MONOS PCT: 10 % (ref 3–12)
NEUTROS PCT: 55 % (ref 43–77)
Neutro Abs: 4.8 10*3/uL (ref 1.7–7.7)
Platelets: 247 10*3/uL (ref 150–400)
RBC: 4.39 MIL/uL (ref 4.22–5.81)
RDW: 13.7 % (ref 11.5–15.5)
WBC: 8.8 10*3/uL (ref 4.0–10.5)

## 2013-08-22 LAB — BASIC METABOLIC PANEL
BUN: 15 mg/dL (ref 6–23)
CO2: 28 meq/L (ref 19–32)
Calcium: 8.8 mg/dL (ref 8.4–10.5)
Chloride: 100 mEq/L (ref 96–112)
Creatinine, Ser: 0.87 mg/dL (ref 0.50–1.35)
GFR calc Af Amer: >90 mL/min (ref >90)
GFR, EST NON AFRICAN AMERICAN: 83 mL/min — AB (ref >90)
GLUCOSE: 124 mg/dL — AB (ref 70–99)
POTASSIUM: 4.3 meq/L (ref 3.7–5.3)
Sodium: 142 mEq/L (ref 137–147)

## 2013-08-22 LAB — GLUCOSE, CAPILLARY
GLUCOSE-CAPILLARY: 113 mg/dL — AB (ref 70–99)
GLUCOSE-CAPILLARY: 141 mg/dL — AB (ref 70–99)
GLUCOSE-CAPILLARY: 124 mg/dL — AB (ref 70–99)
GLUCOSE-CAPILLARY: 123 mg/dL — AB (ref 70–99)

## 2013-08-22 LAB — MAGNESIUM: Magnesium: 1.6 mg/dL (ref 1.5–2.5)

## 2013-08-22 LAB — HEPARIN LEVEL (UNFRACTIONATED)
Heparin Unfractionated: 0.14 IU/mL — ABNORMAL LOW (ref 0.30–0.70)
Heparin Unfractionated: 0.42 IU/mL (ref 0.30–0.70)

## 2013-08-22 LAB — PHOSPHORUS: Phosphorus: 3.9 mg/dL (ref 2.3–4.6)

## 2013-08-22 MED ORDER — LISINOPRIL 10 MG PO TABS
10.0000 mg | ORAL_TABLET | Freq: Every day | ORAL | Status: DC
  Administered 2013-08-22 – 2013-08-23 (×2): 10 mg via ORAL
  Filled 2013-08-22 (×2): qty 1

## 2013-08-22 MED ORDER — METOPROLOL SUCCINATE ER 50 MG PO TB24
50.0000 mg | ORAL_TABLET | Freq: Two times a day (BID) | ORAL | Status: DC
  Administered 2013-08-22 – 2013-08-23 (×3): 50 mg via ORAL
  Filled 2013-08-22 (×4): qty 1

## 2013-08-22 MED ORDER — APIXABAN 5 MG PO TABS
5.0000 mg | ORAL_TABLET | Freq: Two times a day (BID) | ORAL | Status: DC
  Administered 2013-08-22 – 2013-08-23 (×3): 5 mg via ORAL
  Filled 2013-08-22 (×4): qty 1

## 2013-08-22 NOTE — Progress Notes (Signed)
ANTICOAGULATION CONSULT NOTE - Follow Up Consult  Pharmacy Consult for apixiban Indication: afib  Allergies  Allergen Reactions  . Penicillins     Patient Measurements: Height: 5\' 10"  (177.8 cm) Weight: 218 lb 4.1 oz (99 kg) IBW/kg (Calculated) : 73  Vital Signs: Temp: 98.3 F (36.8 C) (01/20 0800) Temp src: Oral (01/20 0800) BP: 141/98 mmHg (01/20 0800) Pulse Rate: 86 (01/20 0700)  Labs:  Recent Labs  08/20/13 0301  08/21/13 0257 08/22/13 0120 08/22/13 0859  HGB 13.6  --  13.2 13.4  --   HCT 39.9  --  39.0 39.4  --   PLT 213  --  221 247  --   HEPARINUNFRC <0.10*  < > 0.37 0.14* 0.42  CREATININE 0.81  --  0.79 0.87  --   < > = values in this interval not displayed.  Estimated Creatinine Clearance: 87.9 ml/min (by C-G formula based on Cr of 0.87).   Medications:  Scheduled:  . aspirin  81 mg Oral Daily  . atorvastatin  80 mg Per Tube q1800  . furosemide  40 mg Oral BID  . insulin aspart  0-9 Units Subcutaneous TID WC  . lisinopril  10 mg Oral Daily  . metoprolol succinate  50 mg Oral BID  . pantoprazole  40 mg Oral Q1200  . pneumococcal 23 valent vaccine  0.5 mL Intramuscular Tomorrow-1000  . sodium chloride  3 mL Intravenous Q12H  . sodium chloride  3 mL Intravenous Q12H   Infusions:  . heparin 1,400 Units/hr (08/22/13 0247)    Assessment: 73 yo male s/p cath with new afib and noted with NSTEMI, CAD s/p CABG, HTN, DM, HF (CHADS-VASC= 5) to start apixiban. Patient noted on heparin and is therapeutic at 1400 units/hr. HL= 0.42, SCr= 0.87, wt= 99.   Goal of Therapy:  Heparin level 0.3-0.7 units/ml Monitor platelets by anticoagulation protocol: Yes   Plan:  -Begin apixiban 5mg  po bid -Discontinue heparin -Will provide patient education  Harland German, Pharm D 08/22/2013 10:02 AM   e

## 2013-08-22 NOTE — Progress Notes (Signed)
Clinical Social Work Department BRIEF PSYCHOSOCIAL ASSESSMENT 08/22/2013  Patient:  Randy Nunez, Randy Nunez     Account Number:  [id number]     Admit date:  08/17/2013  Clinical Social Worker:  Varney Biles  Date/Time:  08/22/2013 11:38 AM  Referred by:  Physician  Date Referred:  08/22/2013 Referred for  Other - See comment   Other Referral:   Assess home situation   Interview type:  Patient Other interview type:    PSYCHOSOCIAL DATA Living Status:  FAMILY Admitted from facility:   Level of care:   Primary support name:  Delois Nolen Mu [phone]) Primary support relationship to patient:  SIBLING Degree of support available:   Good. Pt lives with brother, who he states he is unable to care for any longer, but pt's sister provides support to pt.    CURRENT CONCERNS Current Concerns  Other - See comment   Other Concerns:   Assess home situation    SOCIAL WORK ASSESSMENT / PLAN CSW spoke with pt's RN, explaining CSW got handoff from pt's previous CSW that home situation needs to be assessed. Per RN, pt lives with his brother, and pt is unable to care for his brother and brother is going to live in an ALF. CSW spoke with pt, who states he is feeling much better and anticipates he will get to go home tomorrow. CSW asked pt where he was living before hospitalization, and pt states he lives with his brother, who is in the hospital for a procedure on his leg, and his brother is going to live in an ALF. Pt states this is a good thing, because pt is unable to care for brother (states brother is blind and dependent). Pt states his brother will go to a long-term care facility, and that he will sell the trailer they live in to pay for care if needed. If this happens, pt says he has already started to look for housing and he has found a small 3-bedroom home he is interested in. Pt states he plans to ask for a loan from the bank to get house and he can stay with family members in the meantime  if his brother sells trailer and pt needs to find new housing. Pt states he gets social security, and though it is not much, he will find housing if needed.   Assessment/plan status:  No Further Intervention Required Other assessment/ plan:   Information/referral to community resources:   None needed    PATIENT'S/FAMILY'S RESPONSE TO PLAN OF CARE: Good--pt engaged in conversation with CSW and explained that he will find housing if his brother goes to ALF/SNF for long-term care and needs to sell the trailer they currently live in. Pt not concerned about this, and no additional CSW needs identified. Pt's sister provides support and pt states he can stay with family if needed until he finds new housing. CSW signing off.       Maryclare Labrador, MSW, [facility] Clinical Social Worker [phone]

## 2013-08-22 NOTE — Progress Notes (Signed)
Pt transferred from 2H, pt a/o, no c/o pain, vss, pt stable

## 2013-08-22 NOTE — Progress Notes (Addendum)
Patient ID: Randy Nunez, male   DOB: 07-04-1941, 73 y.o.   MRN: [id number]   SUBJECTIVE: Patient denies dyspnea or chest pain.  He remains in atrial fibrillation with rate mainly in the 80s.   Marland Kitchen aspirin  81 mg Oral Daily  . atorvastatin  80 mg Per Tube q1800  . furosemide  40 mg Oral BID  . insulin aspart  0-9 Units Subcutaneous TID WC  . lisinopril  10 mg Oral Daily  . metoprolol succinate  50 mg Oral BID  . pantoprazole  40 mg Oral Q1200  . pneumococcal 23 valent vaccine  0.5 mL Intramuscular Tomorrow-1000  . sodium chloride  3 mL Intravenous Q12H  . sodium chloride  3 mL Intravenous Q12H     Filed Vitals:   08/22/13 0500 08/22/13 0600 08/22/13 0700 08/22/13 0800  BP: 134/64 153/100 158/86 141/98  Pulse: 83 88 86   Temp:    98.3 F (36.8 C)  TempSrc:    Oral  Resp: 20 16 18    Height:      Weight:      SpO2: 91% 92% 96%     Intake/Output Summary (Last 24 hours) at 08/22/13 0918 Last data filed at 08/22/13 0800  Gross per 24 hour  Intake 446.29 ml  Output    850 ml  Net -403.71 ml    LABS: Basic Metabolic Panel:  Recent Labs  32/20/25 0257 08/22/13 0120  NA 141 142  K 3.7 4.3  CL 100 100  CO2 28 28  GLUCOSE 111* 124*  BUN 17 15  CREATININE 0.79 0.87  CALCIUM 8.4 8.8  MG 1.6 1.6  PHOS 3.6 3.9   Liver Function Tests:  Recent Labs  08/21/13 0257  AST 25  ALT 28  ALKPHOS 71  BILITOT 1.5*  PROT 6.1  ALBUMIN 3.2*   No results found for this basename: LIPASE, AMYLASE,  in the last 72 hours CBC:  Recent Labs  08/21/13 0257 08/22/13 0120  WBC 10.4 8.8  NEUTROABS 5.2 4.8  HGB 13.2 13.4  HCT 39.0 39.4  MCV 89.2 89.7  PLT 221 247   Cardiac Enzymes: No results found for this basename: CKTOTAL, CKMB, CKMBINDEX, TROPONINI,  in the last 72 hours BNP: No components found with this basename: POCBNP,  D-Dimer: No results found for this basename: DDIMER,  in the last 72 hours Hemoglobin A1C: No results found for this basename: HGBA1C,  in the last  72 hours Fasting Lipid Panel: No results found for this basename: CHOL, HDL, LDLCALC, TRIG, CHOLHDL, LDLDIRECT,  in the last 72 hours Thyroid Function Tests: No results found for this basename: TSH, T4TOTAL, FREET3, T3FREE, THYROIDAB,  in the last 72 hours Anemia Panel: No results found for this basename: VITAMINB12, FOLATE, FERRITIN, TIBC, IRON, RETICCTPCT,  in the last 72 hours  RADIOLOGY: Ct Angio Chest Pe W/cm &/or Wo Cm  08/17/2013   CLINICAL DATA:  Ventilator dependent respiratory failure. Severe CHF noted on portable chest examination earlier tonight.  EXAM: CT ANGIOGRAPHY CHEST WITH CONTRAST  TECHNIQUE: Multidetector CT imaging of the chest was performed using the standard protocol during bolus administration of intravenous contrast. Multiplanar CT image reconstructions and MIPs were obtained to evaluate the vascular anatomy.  CONTRAST:  OMNIPAQUE IOHEXOL 350 MG/ML IV.  COMPARISON:  No prior CT.  Portable chest x-ray earlier same date.  FINDINGS: Endotracheal tube tip is just above the carina with the patient completely supine and should be withdrawn 3-4 cm.  Contrast opacification  of the pulmonary arteries is good. No filling defects within either main pulmonary artery or their branches in either lung to suggest pulmonary embolism. Heart enlarged with left ventricular enlargement and borderline left ventricular hypertrophy. Moderate to severe 3 vessel coronary atherosclerosis. Prior CABG. Calcification involving the more superior of the left coronary graft. The middle of the 3 grafts is either very small or potentially occluded (though I cannot state this with certainty as the contrast opacification of the ascending thoracic aorta is not optimized). Moderate to severe atherosclerosis involving the thoracic and upper abdominal aorta without aneurysm or dissection.  Interstitial and airspace pulmonary edema in both lungs. Large bilateral pleural effusions, right greater than left. Dense  consolidation with air bronchograms in the lower lobes and in the upper lobes. Central airways patent.  Numerous normal sized lymph nodes throughout the mediastinum, and in both hila, and in the axillae. No nodal masses. Visualized thyroid gland unremarkable.  Visualized upper abdomen unremarkable for the early arterial phase of enhancement. Bone window images demonstrate mild thoracic spondylosis.  Review of the MIP images confirms the above findings.  IMPRESSION: 1. No evidence of pulmonary embolism. 2. The middle of the 3 coronary bypass grafts is either diminutive or occluded. The other 2 grafts appear patent. Please note that the ascending thoracic aorta was not optimally opacified. 3. Interstitial and airspace pulmonary edema. 4. Dense consolidation with air bronchograms in the lower lobes and in the upper lobes bilaterally, consistent with either dense passive atelectasis related to the large bilateral pleural effusions or potentially pneumonia. 5. Endotracheal tube tip just above the carina with the patient supine. It should be withdrawn approximately 3-4 cm. These results were called by telephone at the time of interpretation on 08/17/2013 at 10:38 PM to Dr. Jaci CarrelHRISTOPHER POLLINA , who verbally acknowledged these results.   Electronically Signed   By: Hulan Saashomas  Lawrence M.D.   On: 08/17/2013 22:41   Dg Chest Port 1 View  08/19/2013   CLINICAL DATA:  Evaluate endotracheal tube.  EXAM: PORTABLE CHEST - 1 VIEW  COMPARISON:  08/18/2013  FINDINGS: Endotracheal tube is 3.4 cm above the carina. Nasogastric tube is coiled in the stomach. Prominent interstitial densities bilaterally suggest edema and minimally changed. Densities at both lung bases are suggestive for pleural fluid and atelectasis, left side greater than right. Heart size appears to be upper limits of normal. No evidence for a pneumothorax.  IMPRESSION: Increased basilar densities, particularly at the left lung base. Findings may represent a combination  of atelectasis and pleural fluid.  Prominent interstitial markings suggest mild edema.  Support apparatuses as described.   Electronically Signed   By: Richarda OverlieAdam  Henn M.D.   On: 08/19/2013 08:36   Dg Chest Port 1 View  08/18/2013   CLINICAL DATA:  Respiratory failure  EXAM: PORTABLE CHEST - 1 VIEW  COMPARISON:  08/17/2013  FINDINGS: Endotracheal tube in good position.  NG tube enters the stomach.  Improvement in diffuse bilateral edema. Mild edema remains. Mild bibasilar atelectasis. Cardiac enlargement.  IMPRESSION: Improving bilateral pulmonary edema.   Electronically Signed   By: Marlan Palauharles  Clark M.D.   On: 08/18/2013 07:35   Dg Chest Port 1 View  08/17/2013   CLINICAL DATA:  Respiratory respiratory distress. ET tube placement.  EXAM: PORTABLE CHEST - 1 VIEW  COMPARISON:  None.  FINDINGS: NG tube is in place and courses into the stomach and below the inferior margin of the film. Endotracheal tube is also in place with the tip approximately 2.6  cm above the carina. There is marked cardiomegaly and extensive bilateral pulmonary edema. No pneumothorax or pleural effusion.  IMPRESSION: ETT tip is approximately 2.6 cm above the carina.  Cardiomegaly and extensive bilateral pulmonary edema.   Electronically Signed   By: Drusilla Kanner M.D.   On: 08/17/2013 20:14    PHYSICAL EXAM General: NAD Neck: No JVD, no thyromegaly or thyroid nodule.  Lungs: Decreased breath sounds at bases. CV: Nondisplaced PMI.  Heart regular S1/S2, no S3/S4, 1/6 SEM.  No peripheral edema.   Abdomen: Soft, nontender, no hepatosplenomegaly, no distention.  Neurologic: Alert and oriented x 3.  Psych: Normal affect. Extremities: No clubbing or cyanosis.   TELEMETRY: Reviewed telemetry pt in atrial fibrillation, HR 80s  ASSESSMENT AND PLAN: 1. CAD: s/p CABG 2001, admitted with NSTEMI.  LHC yesterday showed occluded SVG-D with anterolateral hypokinesis, this is the likely culprit. No interventional option.  Continue statin, ASA 81.    2. Acute respiratory failure requiring ventilation: extubated 1/17. Likely due to pulmonary edema in the setting of NSTEMI.    3. Newly recognized atrial fibrillation with RVR: CHADSVASC = 5. LA 55mm. On heparin gtt. HR now controlled in 80s.  Will stop heparin gtt and start him on apixaban 5 mg bid.  Will stop metoprolol and start Toprol XL 50 mg bid.  Given CHF, would be best to get him out of atrial fibrillation.  Could either anticoagulate x 4 wks and DCCV or treat with 5 doses apixaban with TEE-guided DCCV.  He wants to go home, so probably would bring him back in 4 wks as he seems stable currently. 4. HTN: Add lisinopril 10 mg daily today.  5. Diabetes mellitus: Controlled. A1C 6.6. Would hold off Metformin, Actos given CHF and lactic acidosis on admission. Can consider resuming glipizide.  6. 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  EF 40-45%.  Consolidate to Toprol XL today and add lisinopril as above.  On exam, he does not appear volume overloaded but is still on oxygen.  Will try to titrate off oxygen today.  Will also get CXR PA/lateral to look for significant pleural effusions (consider thoracentesis if significant, would need to hold anticoagulation).  Continue current po Lasix for now.  7. May go to floor today.   Marca Ancona 08/22/2013 9:25 AM

## 2013-08-22 NOTE — Progress Notes (Signed)
ANTICOAGULATION CONSULT NOTE - Follow Up Consult  Pharmacy Consult for heparin Indication: atrial fibrillation  Labs:  Recent Labs  08/19/13 0253 08/20/13 0301  08/20/13 2024 08/21/13 0257 08/22/13 0120  HGB 13.3 13.6  --   --  13.2 13.4  HCT 39.6 39.9  --   --  39.0 39.4  PLT 218 213  --   --  221 247  HEPARINUNFRC 0.45 <0.10*  < > 0.44 0.37 0.14*  CREATININE 0.81 0.81  --   --  0.79  --   < > = values in this interval not displayed.   Assessment: 73yo male subtherapeutic on heparin after resumed post-cath; may require some more time to accumulate but level was at lower end of goal prior to cath so there is some room to make rate adjustment.  Goal of Therapy:  Heparin level 0.3-0.7 units/ml   Plan:  Will increase heparin gtt by 1 unit/kg/hr to 1400 units/hr and check level in 6hr.  Vernard Gambles, PharmD, BCPS  08/22/2013,2:46 AM

## 2013-08-22 NOTE — Progress Notes (Signed)
Progress Note [place] TEAM 1 - Stepdown/ICU TEAM   Randy Nunez QIH:[id number] DOB: 11-Jan-1941 DOA: 08/17/2013 PCP: No PCP Per Patient  Admit HPI / Brief Narrative: 73 y o Male , PMH of DM2, HTN, CAD S/p CABG (2001), HLD . Presented to [facility] with SOB, was found to be in Atria Fib with RVR, was intubated and started on Cardizem drip.   SIGNIFICANT EVENTS / STUDIES:  1/15 - [facility]  ETT- 08/17/2013>>>1/17  HPI/Subjective: Resting comfortably.  No compaints.  Wants to go home asap.    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% Well compensated at present   CAD - s/p CABG 2001 CT angio 08/17/13: showing blockaged in CABG vessel - underwent cardiac cath today revealing severe multivessel coronary disease of native vessels but patency of bypass vessels with exception to total occlusion of the saphenous vein graft to diagonal amenable only to medical therapy - Cardiology managing   Afib with RVR CHADS 2 new this admission- possible DDCV in next few wks  Hypertension - Lisinopril added by cardiology   DM2  - stable currently  Lactic acidosis - lactic acidosis noted on admission likely due to poor cardiac output rather than Metformin  - suspect we can resume home meds upon d/c  Anxiety  on home xanax   Code Status: FULL Family Communication: no family present at time of exam Disposition Plan: As per Cardiology  Consultants: Cardiology  Antibiotics: None  DVT prophylaxis: IV heparin  Objective: Blood pressure 109/62, pulse 82, temperature 98.5 F (36.9 C), temperature source Oral, resp. rate 18, height 5\' 10"  (1.778 m), weight 93.6 kg (206 lb 5.6 oz), SpO2 100.00%.  Intake/Output Summary (Last 24 hours) at 08/22/13 1755 Last data filed at 08/22/13 1700  Gross per 24 hour  Intake 991.29 ml  Output   1150 ml  Net -158.71 ml    Exam: General: No acute respiratory distress Lungs: Clear to auscultation bilaterally without wheezes or crackles Cardiovascular: Regular rate and rhythm without murmur gallop or rub normal S1 and S2 Abdomen: Nontender, nondistended, soft, bowel sounds positive, no rebound, no ascites, no appreciable mass Extremities: No significant cyanosis, clubbing, or edema bilateral lower extremities  Data Reviewed: Basic Metabolic Panel:  Recent Labs Lab 08/18/13 0220 08/18/13 1641 08/19/13 0253 08/20/13 0301 08/21/13 0257 08/22/13 0120  NA 141 143 143 142 141 142  K 4.2 4.0 3.0* 4.0 3.7 4.3  CL 106 105 101 99 100 100  CO2 23 24 27 30 28 28   GLUCOSE 144* 110* 130* 122* 111* 124*  BUN 23 22 17 15 17 15   CREATININE 0.96 0.95 0.81 0.81 0.79 0.87  CALCIUM 8.1* 8.2* 8.3* 8.4 8.4 8.8  MG 1.7  --  1.5 1.8 1.6 1.6  PHOS 3.7  --  2.6 2.8 3.6 3.9   Liver Function Tests:  Recent Labs Lab 08/21/13 0257  AST 25  ALT 28  ALKPHOS 71  BILITOT 1.5*  PROT 6.1  ALBUMIN 3.2*   CBC:  Recent Labs Lab 08/17/13 2050 08/19/13 0253 08/20/13 0301 08/21/13 0257 08/22/13 0120  WBC 15.8* 13.7* 12.2* 10.4 8.8  NEUTROABS 7.7 9.1* 7.8* 5.2 4.8  HGB 15.2 13.3 13.6 13.2 13.4  HCT 44.6 39.6 39.9 39.0 39.4  MCV 92.3 90.6 90.3 89.2 89.7  PLT 282 218 213 221 247   Cardiac Enzymes:  Recent Labs Lab 08/17/13  2050 08/18/13 0220 08/18/13 0924  TROPONINI <0.30 1.41* 3.09*   BNP (last 3 results)  Recent Labs  08/17/13 2050  PROBNP 5197.0*   CBG:  Recent Labs Lab 08/21/13 1744 08/21/13 2135 08/22/13 0817 08/22/13 1156 08/22/13 1630  GLUCAP 173* 129* 113* 141* 124*    Recent Results (from the past 240 hour(s))  MRSA PCR SCREENING     Status: None   Collection Time    08/18/13 12:26 AM      Result Value Range Status   MRSA by PCR NEGATIVE  NEGATIVE Final   Comment:            The GeneXpert MRSA Assay (FDA     approved for NASAL specimens     only), is one component of a      comprehensive MRSA colonization     surveillance program. It is not     intended to diagnose MRSA     infection nor to guide or     monitor treatment for     MRSA infections.     Studies:  Recent x-ray studies have been reviewed in detail by the Attending Physician  Scheduled Meds:  Scheduled Meds: . apixaban  5 mg Oral BID  . aspirin  81 mg Oral Daily  . atorvastatin  80 mg Per Tube q1800  . furosemide  40 mg Oral BID  . insulin aspart  0-9 Units Subcutaneous TID WC  . lisinopril  10 mg Oral Daily  . metoprolol succinate  50 mg Oral BID  . pantoprazole  40 mg Oral Q1200  . pneumococcal 23 valent vaccine  0.5 mL Intramuscular Tomorrow-1000  . sodium chloride  3 mL Intravenous Q12H  . sodium chloride  3 mL Intravenous Q12H    Time spent on care of this patient: 25 mins   Calvert Cantor, MD  [facility] Office  [phone] Pager - Text Page per Loretha Stapler as per below:  On-Call/Text Page:      [email or web address]      password [email or web address]  If 7PM-7AM, please contact night-coverage [email or web address] Password [email or web address] 08/22/2013, 5:55 PM   LOS: 5 days

## 2013-08-23 ENCOUNTER — Encounter (HOSPITAL_COMMUNITY): Payer: Self-pay | Admitting: Physician Assistant

## 2013-08-23 LAB — BASIC METABOLIC PANEL
BUN: 13 mg/dL (ref 6–23)
CALCIUM: 8.7 mg/dL (ref 8.4–10.5)
CO2: 27 meq/L (ref 19–32)
Chloride: 101 mEq/L (ref 96–112)
Creatinine, Ser: 0.74 mg/dL (ref 0.50–1.35)
GFR calc non Af Amer: 89 mL/min — ABNORMAL LOW (ref >90)
Glucose, Bld: 126 mg/dL — ABNORMAL HIGH (ref 70–99)
Potassium: 3.6 mEq/L — ABNORMAL LOW (ref 3.7–5.3)
Sodium: 143 mEq/L (ref 137–147)

## 2013-08-23 LAB — GLUCOSE, CAPILLARY
GLUCOSE-CAPILLARY: 129 mg/dL — AB (ref 70–99)
Glucose-Capillary: 165 mg/dL — ABNORMAL HIGH (ref 70–99)

## 2013-08-23 LAB — CBC
HCT: 38.3 % — ABNORMAL LOW (ref 39.0–52.0)
Hemoglobin: 12.9 g/dL — ABNORMAL LOW (ref 13.0–17.0)
MCH: 30.2 pg (ref 26.0–34.0)
MCHC: 33.7 g/dL (ref 30.0–36.0)
MCV: 89.7 fL (ref 78.0–100.0)
PLATELETS: 239 10*3/uL (ref 150–400)
RBC: 4.27 MIL/uL (ref 4.22–5.81)
RDW: 13.7 % (ref 11.5–15.5)
WBC: 8 10*3/uL (ref 4.0–10.5)

## 2013-08-23 MED ORDER — METOPROLOL SUCCINATE ER 50 MG PO TB24: 50.0000 mg | ORAL_TABLET | Freq: Two times a day (BID) | ORAL | Status: DC

## 2013-08-23 MED ORDER — LISINOPRIL 10 MG PO TABS
10.0000 mg | ORAL_TABLET | Freq: Every day | ORAL | Status: DC
Start: 2013-08-23 — End: 2014-10-01

## 2013-08-23 MED ORDER — FUROSEMIDE 40 MG PO TABS: 40.0000 mg | ORAL_TABLET | Freq: Two times a day (BID) | ORAL | Status: DC

## 2013-08-23 MED ORDER — APIXABAN 5 MG PO TABS: 5.0000 mg | ORAL_TABLET | Freq: Two times a day (BID) | ORAL | Status: DC

## 2013-08-23 MED ORDER — POTASSIUM CHLORIDE CRYS ER 20 MEQ PO TBCR
20.0000 meq | EXTENDED_RELEASE_TABLET | Freq: Once | ORAL | Status: AC
  Administered 2013-08-23: 20 meq via ORAL
  Filled 2013-08-23: qty 1

## 2013-08-23 MED ORDER — POTASSIUM CHLORIDE CRYS ER 20 MEQ PO TBCR: 20.0000 meq | EXTENDED_RELEASE_TABLET | Freq: Every day | ORAL | Status: DC

## 2013-08-23 MED ORDER — ASPIRIN 81 MG PO CHEW: 81.0000 mg | CHEWABLE_TABLET | Freq: Every day | ORAL | Status: AC

## 2013-08-23 NOTE — Discharge Summary (Signed)
DISCHARGE SUMMARY  Randy Nunez  MR#: [id number]  DOB:1941-08-03  Date of Admission: 08/17/2013 Date of Discharge: 08/23/2013  Attending Physician:Leasha Goldberger T  Patient's PCP: Catalina PizzaZach,Hall, MD - [place], [place]  Consults: [facility]  Disposition: D/C home   Follow-up Appts:     Follow-up Information   Follow up with Lars MassonNELSON, KATARINA, H, MD. [facility] - 08/31/13 at 3:15pm)    Specialty:  Cardiology   Contact information:   [address] [place] [id number] [phone]       [facility] with [facility] [address] [facility]. ([facility] - 09/13/13 at 9:30am for education visit. This is to make sure you are doing well on Eliquis.)    Specialty:  Cardiology   Contact information:   [address], [address] [place] [address] [phone]     Discharge Diagnoses: Vent dependent respiratory failure - 2/2 pulm edema  CAD - s/p CABG 2001  Afib with RVR CHADS 2  Hypertension  DM2  Lactic acidosis  Anxiety   Initial presentation: 73 y o Male w/ PMH of DM2, HTN, CAD S/p CABG (2001), HLD who [facility] with SOB, was found to be in Atria Fib with RVR, was intubated and started on Cardizem drip.   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 Continue ACEI, BB. Weight down 13lbs from adm, -7L. Seems to be tolerating this dose of Lasix.  CAD - s/p CABG 2001  Cardiology managed, and noted the following:  admitted with NSTEMI - CT angio neg for PE, cath 1/19 showing  continued patency of LIMA-LAD, SVG-PDA, seq SVG-OM1-OM2 and total occ of SVG-diag. Medical therapy. Continue low dose ASA, statin, beta blocker. Duration of ASA while on anticoag for PAF per primary Cardiologist.  Newly recognized atrial fibrillation with RVR, intermittent WCT c/w abberrancy, also with bradycardia As per Cardiology:  CHADSVASC = 5. LA 55mm. Started on apixaban ($6 copay). Plan for outpatient DCCV after 4 weeks of anticoagulation as he is extremely eager to go home.  Hypertension  - Lisinopril added by cardiology   DM2  - stable currently - A1C 6.6  Lactic acidosis  - lactic acidosis noted on admission likely due to poor cardiac output rather than Metformin  - suspect we can resume home meds upon d/c   Anxiety  on home xanax     Medication List    STOP taking these medications       diltiazem 360 MG 24 hr capsule  Commonly known as:  TIAZAC     metoprolol 100 MG tablet  Commonly known as:  LOPRESSOR     pioglitazone 30 MG tablet  Commonly known as:  ACTOS      TAKE these medications       ALPRAZolam 0.5 MG tablet  Commonly known as:  XANAX  Take 0.5 mg by mouth daily as needed for anxiety.     apixaban 5 MG Tabs tablet  Commonly known as:  ELIQUIS  Take 1 tablet (5 mg total) by mouth 2 (  two) times daily.     aspirin 81 MG chewable tablet  Chew 1 tablet (81 mg total) by mouth daily.     furosemide 40 MG tablet  Commonly known as:  LASIX  Take 1 tablet (40 mg total) by mouth 2 (two) times daily.     glipiZIDE-metformin 5-500 MG per tablet  Commonly known as:  METAGLIP  Take 2 tablets by mouth 2 (two) times daily.     lisinopril 10 MG tablet  Commonly known as:  PRINIVIL,ZESTRIL  Take 1 tablet (10 mg total) by mouth daily.     metoprolol succinate 50 MG 24 hr tablet  Commonly known as:  TOPROL-XL  Take 1 tablet (50 mg total) by mouth 2 (two) times daily. Take with or immediately following a meal.     potassium chloride SA 20 MEQ tablet   Commonly known as:  K-DUR,KLOR-CON  Take 1 tablet (20 mEq total) by mouth daily.  Start taking on:  08/24/2013     pravastatin 40 MG tablet  Commonly known as:  PRAVACHOL  Take 40 mg by mouth at bedtime.        Day of Discharge BP 120/68  Pulse 91  Temp(Src) 98.1 F (36.7 C) (Oral)  Resp 18  Ht 5\' 10"  (1.778 m)  Wt 91.944 kg (202 lb 11.2 oz)  BMI 29.08 kg/m2  SpO2 93%  Physical Exam: General: No acute respiratory distress Lungs: Clear to auscultation bilaterally without wheezes or crackles Cardiovascular: Regular rate without murmur gallop or rub normal S1 and S2 Abdomen: Nontender, nondistended, soft, bowel sounds positive, no rebound, no ascites, no appreciable mass Extremities: No significant cyanosis, clubbing, or edema bilateral lower extremities  BASIC METABOLIC PANEL     Status: Abnormal   Collection Time    08/23/13  5:49 AM      Result Value Range   Sodium 143  137 - 147 mEq/L   Potassium 3.6 (*) 3.7 - 5.3 mEq/L   Chloride 101  96 - 112 mEq/L   CO2 27  19 - 32 mEq/L   Glucose, Bld 126 (*) 70 - 99 mg/dL   BUN 13  6 - 23 mg/dL   Creatinine, Ser 0.25  0.50 - 1.35 mg/dL   Calcium 8.7  8.4 - 42.7 mg/dL   GFR calc non Af Amer 89 (*) >90 mL/min   GFR calc Af Amer >90  >90 mL/min  CBC     Status: Abnormal   Collection Time    08/23/13  5:49 AM      Result Value Range   WBC 8.0  4.0 - 10.5 K/uL   RBC 4.27  4.22 - 5.81 MIL/uL   Hemoglobin 12.9 (*) 13.0 - 17.0 g/dL   HCT 06.2 (*) 37.6 - 28.3 %   MCV 89.7  78.0 - 100.0 fL   MCH 30.2  26.0 - 34.0 pg   MCHC 33.7  30.0 - 36.0 g/dL   RDW 15.1  76.1 - 60.7 %   Platelets 239  150 - 400 K/uL   Time spent in discharge (includes decision making & examination of pt): >35 minutes  08/23/2013, 11:58 AM   Lonia Blood, MD [facility] Office  [phone] Pager [phone]  On-Call/Text Page:      [email or web address]      password [facility]

## 2013-08-23 NOTE — Discharge Instructions (Signed)
Atrial Fibrillation Atrial fibrillation is a type of irregular heart rhythm (arrhythmia). During atrial fibrillation, the upper chambers of the heart (atria) quiver continuously in a chaotic pattern. This causes an irregular and often rapid heart rate.  Atrial fibrillation is the result of the heart becoming overloaded with disorganized signals that tell it to beat. These signals are normally released one at a time by a part of the right atrium called the sinoatrial node. They then travel from the atria to the lower chambers of the heart (ventricles), causing the atria and ventricles to contract and pump blood as they pass. In atrial fibrillation, parts of the atria outside of the sinoatrial node also release these signals. This results in two problems. First, the atria receive so many signals that they do not have time to fully contract. Second, the ventricles, which can only receive one signal at a time, beat irregularly and out of rhythm with the atria.  There are three types of atrial fibrillation:   Paroxysmal Paroxysmal atrial fibrillation starts suddenly and stops on its own within a week.   Persistent Persistent atrial fibrillation lasts for more than a week. It may stop on its own or with treatment.   Permanent Permanent atrial fibrillation does not go away. Episodes of atrial fibrillation may lead to permanent atrial fibrillation.  Atrial fibrillation can prevent your heart from pumping blood normally. It increases your risk of stroke and can lead to heart failure.  CAUSES   Heart conditions, including a heart attack, heart failure, coronary artery disease, and heart valve conditions.   Inflammation of the sac that surrounds the heart (pericarditis).   Blockage of an artery in the lungs (pulmonary embolism).   Pneumonia or other infections.   Chronic lung disease.   Thyroid problems, especially if the thyroid is overactive (hyperthyroidism).   Caffeine, excessive alcohol  use, and use of some illegal drugs.   Use of some medications, including certain decongestants and diet pills.   Heart surgery.   Birth defects.  Sometimes, no cause can be found. When this happens, the atrial fibrillation is called lone atrial fibrillation. The risk of complications from atrial fibrillation increases if you have lone atrial fibrillation and you are age 26 years or older. RISK FACTORS  Heart failure.  Coronary artery disease  Diabetes mellitus.   High blood pressure (hypertension).   Obesity.   Other arrhythmias.   Increased age. SYMPTOMS   A feeling that your heart is beating rapidly or irregularly.   A feeling of discomfort or pain in your chest.   Shortness of breath.   Sudden lightheadedness or weakness.   Getting tired easily when exercising.   Urinating more often than normal (mainly when atrial fibrillation first begins).  In paroxysmal atrial fibrillation, symptoms may start and suddenly stop. DIAGNOSIS  Your caregiver may be able to detect atrial fibrillation when taking your pulse. Usually, testing is needed to diagnosis atrial fibrillation. Tests may include:   Electrocardiography. During this test, the electrical impulses of your heart are recorded while you are lying down.   Echocardiography. During echocardiography, sound waves are used to evaluate how blood flows through your heart.   Stress test. There is more than one type of stress test. If a stress test is needed, ask your caregiver about which type is best for you.   Chest X-ray exam.   Blood tests.   Computed tomography (CT).  TREATMENT   Treating any underlying conditions. For example, if you have an overactive  thyroid, treating the condition may correct atrial fibrillation.   Medication. Medications may be given to control a rapid heart rate or to prevent blood clots, heart failure, or a stroke.   Procedure to correct the rhythm of the  heart:  Electrical cardioversion. During electrical cardioversion, a controlled, low-energy shock is delivered to the heart through your skin. If you have chest pain, very low pressure blood pressure, or sudden heart failure, this procedure may need to be done as an emergency.  Catheter ablation. During this procedure, heart tissues that send the signals that cause atrial fibrillation are destroyed.  Maze or minimaze procedure. During this surgery, thin lines of heart tissue that carry the abnormal signals are destroyed. The maze procedure is an open-heart surgery. The minimaze procedure is a minimally invasive surgery. This means that small cuts are made to access the heart instead of a large opening.  Pulmonary venous isolation. During this surgery, tissue around the veins that carry blood from the lungs (pulmonary veins) is destroyed. This tissue is thought to carry the abnormal signals. HOME CARE INSTRUCTIONS   Take medications as directed by your caregiver.  Only take medications that your caregiver approves. Some medications can make atrial fibrillation worse or recur.  If blood thinners were prescribed by your caregiver, take them exactly as directed. Too much can cause bleeding. Too little and you will not have the needed protection against stroke and other problems.  Perform blood tests at home if directed by your caregiver.  Perform blood tests exactly as directed.   Quit smoking if you smoke.   Do not drink alcohol.   Do not drink caffeinated beverages such as coffee, soda, and some teas. You may drink decaffeinated coffee, soda, or tea.   Maintain a healthy weight. Do not use diet pills unless your caregiver approves. They may make heart problems worse.   Follow diet instructions as directed by your caregiver.   Exercise regularly as directed by your caregiver.   Keep all follow-up appointments. PREVENTION  The following substances can cause atrial fibrillation  to recur:   Caffeinated beverages.   Alcohol.   Certain medications, especially those used for breathing problems.   Certain herbs and herbal medications, such as those containing ephedra or ginseng.  Illegal drugs such as cocaine and amphetamines. Sometimes medications are given to prevent atrial fibrillation from recurring. Proper treatment of any underlying condition is also important in helping prevent recurrence.  SEEK MEDICAL CARE IF:  You notice a change in the rate, rhythm, or strength of your heartbeat.   You suddenly begin urinating more frequently.   You tire more easily when exerting yourself or exercising.  SEEK IMMEDIATE MEDICAL CARE IF:   You develop chest pain, abdominal pain, sweating, or weakness.  You feel sick to your stomach (nauseous).  You develop shortness of breath.  You suddenly develop swollen feet and ankles.  You feel dizzy.  You face or limbs feel numb or weak.  There is a change in your vision or speech. MAKE SURE YOU:   Understand these instructions.  Will watch your condition.  Will get help right away if you are not doing well or get worse. Document Released: 07/20/2005 Document Revised: 11/14/2012 Document Reviewed: 08/30/2012 [facility] Patient Information 2014 [employer] [place].  Information on my medicine - ELIQUIS (apixaban)  Why was Eliquis prescribed for you? Eliquis was prescribed for you to reduce the risk of forming blood clots that can cause a stroke if you have a medical  condition called atrial fibrillation (a type of irregular heartbeat) OR to reduce the risk of a blood clots forming after orthopedic surgery.  What do You need to know about Eliquis ? Take your Eliquis TWICE DAILY - one tablet in the morning and one tablet in the evening with or without food.  It would be best to take the doses about the same time each day.  If you have difficulty swallowing the tablet whole please discuss with your pharmacist  how to take the medication safely.  Take Eliquis exactly as prescribed by your doctor and DO NOT stop taking Eliquis without talking to the doctor who prescribed the medication.  Stopping may increase your risk of developing a new clot or stroke.  Refill your prescription before you run out.  After discharge, you should have regular check-up appointments with your healthcare provider that is prescribing your Eliquis.  In the future your dose may need to be changed if your kidney function or weight changes by a significant amount or as you get older.  What do you do if you miss a dose? If you miss a dose, take it as soon as you remember on the same day and resume taking twice daily.  Do not take more than one dose of ELIQUIS at the same time.  Important Safety Information A possible side effect of Eliquis is bleeding. You should call your healthcare provider right away if you experience any of the following:   Bleeding from an injury or your nose that does not stop.   Unusual colored urine (red or dark brown) or unusual colored stools (red or black).   Unusual bruising for unknown reasons.   A serious fall or if you hit your head (even if there is no bleeding).  Some medicines may interact with Eliquis and might increase your risk of bleeding or clotting while on Eliquis. To help avoid this, consult your healthcare provider or pharmacist prior to using any new prescription or non-prescription medications, including herbals, vitamins, non-steroidal anti-inflammatory drugs (NSAIDs) and supplements.  This website has more information on Eliquis (apixaban): [email or web address].com.cy.

## 2013-08-23 NOTE — Progress Notes (Signed)
Followups: Dr. Delton See 08/31/13 at 3:15pm Anticoagulation Clinic since new to Eliquis 09/13/13 at 9:30am  Dayna Dunn PA-C

## 2013-08-23 NOTE — Progress Notes (Signed)
D/C Tele, D/C IV, pt. D/C instructions reviewed with pt., pt. Verbalized understanding, D/C paperwork and belongings sent home with pt., pt. Left unit via Wheelchair and was transported home via his sister.

## 2013-08-23 NOTE — Progress Notes (Addendum)
Patient: Randy Nunez / Admit Date: 08/17/2013 / Date of Encounter: 08/23/2013, 7:54 AM   Subjective  Feeling great. No CP or SOB. Eager to go home.  Objective   Telemetry: afib rates 90s-105  Physical Exam: Blood pressure 154/74, pulse 96, temperature 98.1 F (36.7 C), temperature source Oral, resp. rate 18, height 5\' 10"  (1.778 m), weight 202 lb 11.2 oz (91.944 kg), SpO2 96.00%. General: Well developed, well nourished WM in no acute distress. Head: Normocephalic, atraumatic, sclera non-icteric, no xanthomas, nares are without discharge. Neck: JVP not elevated. Lungs: Clear bilaterally to auscultation without wheezes, rales, or rhonchi. Breathing is unlabored. Heart: Irreg irreg S1 S2 without murmurs, rubs, or gallops.  Abdomen: Soft, non-tender, non-distended with normoactive bowel sounds. No rebound/guarding. Extremities: No clubbing or cyanosis. No edema. Distal pedal pulses are 2+ and equal bilaterally. R radial cath site without complication, good pulse in tact Neuro: Alert and oriented X 3. Moves all extremities spontaneously. Psych:  Responds to questions appropriately with a normal affect.   Intake/Output Summary (Last 24 hours) at 08/23/13 0754 Last data filed at 08/22/13 2020  Gross per 24 hour  Intake    748 ml  Output    500 ml  Net    248 ml    Inpatient Medications:  . apixaban  5 mg Oral BID  . aspirin  81 mg Oral Daily  . atorvastatin  80 mg Per Tube q1800  . furosemide  40 mg Oral BID  . insulin aspart  0-9 Units Subcutaneous TID WC  . lisinopril  10 mg Oral Daily  . metoprolol succinate  50 mg Oral BID  . pantoprazole  40 mg Oral Q1200  . pneumococcal 23 valent vaccine  0.5 mL Intramuscular Tomorrow-1000  . sodium chloride  3 mL Intravenous Q12H  . sodium chloride  3 mL Intravenous Q12H   Infusions:    Labs:  Recent Labs  08/21/13 0257 08/22/13 0120 08/23/13 0549  NA 141 142 143  K 3.7 4.3 3.6*  CL 100 100 101  CO2 28 28 27   GLUCOSE 111*  124* 126*  BUN 17 15 13   CREATININE 0.79 0.87 0.74  CALCIUM 8.4 8.8 8.7  MG 1.6 1.6  --   PHOS 3.6 3.9  --     Recent Labs  08/21/13 0257  AST 25  ALT 28  ALKPHOS 71  BILITOT 1.5*  PROT 6.1  ALBUMIN 3.2*    Recent Labs  08/21/13 0257 08/22/13 0120 08/23/13 0549  WBC 10.4 8.8 8.0  NEUTROABS 5.2 4.8  --   HGB 13.2 13.4 12.9*  HCT 39.0 39.4 38.3*  MCV 89.2 89.7 89.7  PLT 221 247 239   Radiology/Studies:  Dg Chest 2 View  08/22/2013   CLINICAL DATA:  Followup of pleural effusion.  CAD.  Diabetes.  EXAM: CHEST  2 VIEW  COMPARISON:  DG CHEST 1V PORT dated 08/19/2013; DG CHEST 1V PORT dated 08/18/2013; CT ANGIO CHEST W/CM &/OR WO/CM dated 08/17/2013  FINDINGS: Extubation and removal of nasogastric tube. Lateral view degraded by patient arm position. Prior median sternotomy. Midline trachea. moderate cardiomegaly with aortic atherosclerosis. Decreased trace bilateral pleural effusions. No pneumothorax. Interval resolution of interstitial edema. Improved bibasilar aeration with resolved atelectasis.  IMPRESSION: Cardiomegaly with resolution of congestive heart failure and bibasilar atelectasis.  Trace bilateral pleural effusions remain.  Extubation and removal of nasogastric tube.   Electronically Signed   By: Jeronimo GreavesKyle  Talbot M.D.   On: 08/22/2013 21:15   Ct Angio  Chest Pe W/cm &/or Wo Cm  08/17/2013   CLINICAL DATA:  Ventilator dependent respiratory failure. Severe CHF noted on portable chest examination earlier tonight.  EXAM: CT ANGIOGRAPHY CHEST WITH CONTRAST  TECHNIQUE: Multidetector CT imaging of the chest was performed using the standard protocol during bolus administration of intravenous contrast. Multiplanar CT image reconstructions and MIPs were obtained to evaluate the vascular anatomy.  CONTRAST:  OMNIPAQUE IOHEXOL 350 MG/ML IV.  COMPARISON:  No prior CT.  Portable chest x-ray earlier same date.  FINDINGS: Endotracheal tube tip is just above the carina with the patient  completely supine and should be withdrawn 3-4 cm.  Contrast opacification of the pulmonary arteries is good. No filling defects within either main pulmonary artery or their branches in either lung to suggest pulmonary embolism. Heart enlarged with left ventricular enlargement and borderline left ventricular hypertrophy. Moderate to severe 3 vessel coronary atherosclerosis. Prior CABG. Calcification involving the more superior of the left coronary graft. The middle of the 3 grafts is either very small or potentially occluded (though I cannot state this with certainty as the contrast opacification of the ascending thoracic aorta is not optimized). Moderate to severe atherosclerosis involving the thoracic and upper abdominal aorta without aneurysm or dissection.  Interstitial and airspace pulmonary edema in both lungs. Large bilateral pleural effusions, right greater than left. Dense consolidation with air bronchograms in the lower lobes and in the upper lobes. Central airways patent.  Numerous normal sized lymph nodes throughout the mediastinum, and in both hila, and in the axillae. No nodal masses. Visualized thyroid gland unremarkable.  Visualized upper abdomen unremarkable for the early arterial phase of enhancement. Bone window images demonstrate mild thoracic spondylosis.  Review of the MIP images confirms the above findings.  IMPRESSION: 1. No evidence of pulmonary embolism. 2. The middle of the 3 coronary bypass grafts is either diminutive or occluded. The other 2 grafts appear patent. Please note that the ascending thoracic aorta was not optimally opacified. 3. Interstitial and airspace pulmonary edema. 4. Dense consolidation with air bronchograms in the lower lobes and in the upper lobes bilaterally, consistent with either dense passive atelectasis related to the large bilateral pleural effusions or potentially pneumonia. 5. Endotracheal tube tip just above the carina with the patient supine. It should be  withdrawn approximately 3-4 cm. These results were called by telephone at the time of interpretation on 08/17/2013 at 10:38 PM to Dr. Jaci Carrel , who verbally acknowledged these results.   Electronically Signed   By: Hulan Saas M.D.   On: 08/17/2013 22:41   Dg Chest Port 1 View  08/19/2013   CLINICAL DATA:  Evaluate endotracheal tube.  EXAM: PORTABLE CHEST - 1 VIEW  COMPARISON:  08/18/2013  FINDINGS: Endotracheal tube is 3.4 cm above the carina. Nasogastric tube is coiled in the stomach. Prominent interstitial densities bilaterally suggest edema and minimally changed. Densities at both lung bases are suggestive for pleural fluid and atelectasis, left side greater than right. Heart size appears to be upper limits of normal. No evidence for a pneumothorax.  IMPRESSION: Increased basilar densities, particularly at the left lung base. Findings may represent a combination of atelectasis and pleural fluid.  Prominent interstitial markings suggest mild edema.  Support apparatuses as described.   Electronically Signed   By: Richarda Overlie M.D.   On: 08/19/2013 08:36   Dg Chest Port 1 View  08/18/2013   CLINICAL DATA:  Respiratory failure  EXAM: PORTABLE CHEST - 1 VIEW  COMPARISON:  08/17/2013  FINDINGS: Endotracheal tube in good position.  NG tube enters the stomach.  Improvement in diffuse bilateral edema. Mild edema remains. Mild bibasilar atelectasis. Cardiac enlargement.  IMPRESSION: Improving bilateral pulmonary edema.   Electronically Signed   By: Marlan Palau M.D.   On: 08/18/2013 07:35   Dg Chest Port 1 View  08/17/2013   CLINICAL DATA:  Respiratory respiratory distress. ET tube placement.  EXAM: PORTABLE CHEST - 1 VIEW  COMPARISON:  None.  FINDINGS: NG tube is in place and courses into the stomach and below the inferior margin of the film. Endotracheal tube is also in place with the tip approximately 2.6 cm above the carina. There is marked cardiomegaly and extensive bilateral pulmonary edema.  No pneumothorax or pleural effusion.  IMPRESSION: ETT tip is approximately 2.6 cm above the carina.  Cardiomegaly and extensive bilateral pulmonary edema.   Electronically Signed   By: Drusilla Kanner M.D.   On: 08/17/2013 20:14     Assessment and Plan  1. CAD s/p CABG 2001, admitted with NSTEMI - CT angio neg for PE, cath 1/19 showing continued patency of LIMA-LAD, SVG-PDA, seq SVG-OM1-OM2 and total occ of SVG-diag. Medical therapy. Continue low dose ASA, statin, beta blocker. Duration of ASA while on anticoag for PAF per primary cardiologist. 2. Acute respiratory failure requiring ventilation - extubated 1/17. Likely due to pulm edema in the setting of cardiac issues.  3. Newly recognized atrial fibrillation with RVR, intermittent WCT c/w abberrancy, also with bradycardia. CHADSVASC = 5. LA 55mm. Started on apixaban ($6 copay). Rate slightly this AM but has not gotten metoprolol yet (was in 60s-70s yesterday). Likely plan for outpatient DCCV after 4 weeks of anticoagulation as he is extremely eager to go home. 4. HTN - variable; ACEI started. Follow before adding further meds. 5. Diabetes mellitus - controlled. A1C 6.6. Per IM. 6. 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 Continue ACEI, BB. Weight down 13lbs from adm, -7L. Seems to be tolerating this dose of Lasix. 7. Hypokalemia - start regular supplementation while on Lasix.  Ambulate this AM. MD to review meds for discharge. If OK to go, let me know and I can help arrange f/u.  Signed, Ronie Spies PA-C  Personally seen and examined, agree with above. OK with DC. Eliquis. DCCV in 4 weeks. Reasonable rate control currently. Fine with current medications. OK with DC from cardiac perspective.   Donato Schultz, MD

## 2013-08-24 ENCOUNTER — Other Ambulatory Visit: Payer: Self-pay | Admitting: *Deleted

## 2013-08-24 MED ORDER — APIXABAN 5 MG PO TABS
5.0000 mg | ORAL_TABLET | Freq: Two times a day (BID) | ORAL | Status: DC
Start: 2013-08-24 — End: 2013-09-14

## 2013-08-24 MED FILL — Medication: Qty: 1 | Status: AC

## 2013-08-25 ENCOUNTER — Encounter: Payer: Self-pay | Admitting: *Deleted

## 2013-08-28 ENCOUNTER — Inpatient Hospital Stay (HOSPITAL_COMMUNITY)
Admission: EM | Admit: 2013-08-28 | Discharge: 2013-08-30 | DRG: 292 | Disposition: A | Discharge status: Home / Self Care | Payer: Medicare Other | Attending: Internal Medicine | Admitting: Internal Medicine

## 2013-08-28 ENCOUNTER — Encounter (HOSPITAL_COMMUNITY): Payer: Self-pay | Admitting: Emergency Medicine

## 2013-08-28 ENCOUNTER — Emergency Department (HOSPITAL_COMMUNITY): Payer: Medicare Other

## 2013-08-28 DIAGNOSIS — D72829 Elevated white blood cell count, unspecified: Secondary | ICD-10-CM | POA: Diagnosis present

## 2013-08-28 DIAGNOSIS — I4891 Unspecified atrial fibrillation: Secondary | ICD-10-CM

## 2013-08-28 DIAGNOSIS — I2510 Atherosclerotic heart disease of native coronary artery without angina pectoris: Secondary | ICD-10-CM

## 2013-08-28 DIAGNOSIS — E119 Type 2 diabetes mellitus without complications: Secondary | ICD-10-CM

## 2013-08-28 DIAGNOSIS — I10 Essential (primary) hypertension: Secondary | ICD-10-CM

## 2013-08-28 DIAGNOSIS — I509 Heart failure, unspecified: Secondary | ICD-10-CM

## 2013-08-28 DIAGNOSIS — Z951 Presence of aortocoronary bypass graft: Secondary | ICD-10-CM

## 2013-08-28 DIAGNOSIS — E785 Hyperlipidemia, unspecified: Secondary | ICD-10-CM

## 2013-08-28 DIAGNOSIS — Z7982 Long term (current) use of aspirin: Secondary | ICD-10-CM

## 2013-08-28 DIAGNOSIS — F411 Generalized anxiety disorder: Secondary | ICD-10-CM | POA: Diagnosis present

## 2013-08-28 DIAGNOSIS — I252 Old myocardial infarction: Secondary | ICD-10-CM

## 2013-08-28 DIAGNOSIS — I428 Other cardiomyopathies: Secondary | ICD-10-CM | POA: Diagnosis present

## 2013-08-28 DIAGNOSIS — I5043 Acute on chronic combined systolic (congestive) and diastolic (congestive) heart failure: Principal | ICD-10-CM

## 2013-08-28 MED ORDER — SODIUM CHLORIDE 0.9 % IV SOLN
Freq: Once | INTRAVENOUS | Status: AC
  Administered 2013-08-29: via INTRAVENOUS

## 2013-08-28 MED ORDER — ASPIRIN 81 MG PO CHEW
324.0000 mg | CHEWABLE_TABLET | Freq: Once | ORAL | Status: AC
  Administered 2013-08-29: 324 mg via ORAL
  Filled 2013-08-28: qty 4

## 2013-08-28 NOTE — ED Notes (Signed)
Pt c/o sob x . Pt states he was just discharged from the hospital for the same.

## 2013-08-28 NOTE — ED Provider Notes (Addendum)
CSN: [id number]     Arrival date & time 08/28/13  2233 History   This chart was scribed for Randy Roller, MD by Donne Anon, ED Scribe. This patient was seen in room APA06/APA06 and the patient's care was started at 2329.   MD Initiated Contact with Patient 08/28/13 2329     Chief Complaint  Patient presents with  . Shortness of Breath    The history is provided by the patient. No language interpreter was used.   HPI Comments: Randy Nunez is a 73 y.o. male who presents to the Emergency Department complaining of 30 minutes of SOB which occurred 1 hour ago when he woke up from a nap. He states he was just discharged home from the hospital where he was treated for A fib and CHF and diuresed 15 pounds of fluid. He has done well until he became SOB this afternoon. He reports the oxygen he received in the ED relieved his symptoms, and he is not currently feeling SOB. He reports associated cough. He denies fever, chills, leg swelling or any other symptoms. He had a heart bypass 12 years ago.   During his recent admission to the hospital it was found that he had an ejection fraction around 40%, a heart catheterization also showed that he had total occlusion of the saphenous vein graft to the left circumflex which was amenable only to medical therapy but no other intervention. He was started on anticoagulants secondary to his new onset A. fib and the plan was for outpatient cardioversion after 4 weeks of anticoagulation.  His weight on discharge was 200 pounds. He states he has been taking his Lasix as prescribed   Past Medical History  Diagnosis Date  . Diabetes mellitus without complication   . Hypertension   . CAD (coronary artery disease)     a. s/p CABG 2001. b. NSTEMI 08/2013 - cath done, med rx recommended.  . Atrial fibrillation     a. Dx 08/2013. Placed on eliquis. Plan DCCV as outpt. Also has had WCT c/w aberrancy.  . Chronic combined systolic and diastolic CHF (congestive heart  failure)   . Acute respiratory failure     a. 08/2013 - VDRF due to pulm edema.   Past Surgical History  Procedure Laterality Date  . Left heart cath  2015    Dr Allyson Sabal, with stenting  . Coronary artery bypass graft  2001   Family History  Problem Relation Age of Onset  . Hypertension    . Hyperlipidemia     History  Substance Use Topics  . Smoking status: Never Smoker   . Smokeless tobacco: Not on file  . Alcohol Use: No    Review of Systems  Constitutional: Negative for fever and chills.  Respiratory: Positive for cough and shortness of breath.   Cardiovascular: Negative for leg swelling.  All other systems reviewed and are negative.    Allergies  Penicillins  Home Medications   No current outpatient prescriptions on file. BP 129/56  Pulse 69  Temp(Src) 98.2 F (36.8 C) (Oral)  Resp 18  Ht 5\' 11"  (1.803 m)  Wt 211 lb 3.2 oz (95.8 kg)  BMI 29.47 kg/m2  SpO2 96%  Physical Exam  Nursing note and vitals reviewed. Constitutional: He appears well-developed and well-nourished. No distress.  HENT:  Head: Normocephalic and atraumatic.  Eyes: Conjunctivae are normal.  Neck: Neck supple. No tracheal deviation present.  Cardiovascular: Normal rate and normal heart sounds.  An irregularly irregular  rhythm present.  Pulmonary/Chest: Effort normal. No respiratory distress. He has no wheezes. He has rales.  Rales at bilateral bases. Nor respiratory distress.  Abdominal: Soft. He exhibits no distension. There is no tenderness. There is no rebound and no guarding.  Musculoskeletal: Normal range of motion.  No pitting edema of the legs.  Neurological: He is alert.  Skin: Skin is warm and dry.  Psychiatric: He has a normal mood and affect. His behavior is normal.    ED Course  Procedures (including critical care time) DIAGNOSTIC STUDIES: Oxygen Saturation is 90% on RA, low by my interpretation.    COORDINATION OF CARE: 11:41 PM Discussed treatment plan which includes  CXR with pt at bedside and pt agreed to plan.   11:50 PM When pt is taken off oxygen in the ED his saturation drop sto 89%   Labs Review Labs Reviewed  BASIC METABOLIC PANEL - Abnormal; Notable for the following:    Potassium 3.5 (*)    Chloride 95 (*)    Glucose, Bld 127 (*)    BUN 30 (*)    GFR calc non Af Amer 61 (*)    GFR calc Af Amer 71 (*)    All other components within normal limits  CBC WITH DIFFERENTIAL - Abnormal; Notable for the following:    WBC 15.3 (*)    Neutro Abs 11.0 (*)    All other components within normal limits  APTT - Abnormal; Notable for the following:    aPTT 45 (*)    All other components within normal limits  PROTIME-INR - Abnormal; Notable for the following:    Prothrombin Time 17.8 (*)    INR 1.51 (*)    All other components within normal limits  PRO B NATRIURETIC PEPTIDE - Abnormal; Notable for the following:    Pro B Natriuretic peptide (BNP) 1476.0 (*)    All other components within normal limits  URINALYSIS, ROUTINE W REFLEX MICROSCOPIC - Abnormal; Notable for the following:    Color, Urine STRAW (*)    All other components within normal limits  URINE CULTURE  TROPONIN I  COMPREHENSIVE METABOLIC PANEL  CBC   Imaging Review Dg Chest Port 1 View  08/29/2013   CLINICAL DATA:  Shortness of breath  EXAM: PORTABLE CHEST - 1 VIEW  COMPARISON:  08/22/2013  FINDINGS: Fall cardiac enlargement. Central vascular congestion. Interstitial prominence. Cannot exclude a retrocardiac process. Small pleural effusions. Median sternotomy. Limited osseous evaluation due to technique/patient body habitus.  IMPRESSION: Cardiac enlargement and central vascular congestion. Small pleural effusions. Interstitial prominence may reflect interstitial edema.   Electronically Signed   By: Jearld Lesch M.D.   On: 08/29/2013 00:56    EKG Interpretation    Date/Time:  Tuesday August 29 2013 00:00:05 EST Ventricular Rate:  62 PR Interval:    QRS Duration: 94 QT  Interval:  408 QTC Calculation: 414 R Axis:   -30 Text Interpretation:  Atrial fibrillation Left axis deviation Non-specific intra-ventricular conduction delay Possible Anterior infarct , age undetermined Abnormal ECG When compared with ECG of 18-Aug-2013 16:58, Vent. rate has decreased BY  43 BPM QRS axis Shifted left Borderline criteria for Anterior infarct are now Present QT has shortened Confirmed by Ndia Sampath  MD, Anjuli Gemmill ([id number]) on 08/29/2013 4:04:49 AM            MDM   1. CHF (congestive heart failure)   2. A-fib   3. Acute on chronic combined systolic and diastolic CHF (congestive heart failure)  4. CAD (coronary artery disease)   5. Diabetes mellitus, type 2   6. Essential hypertension, benign    The patient has increased in weight approximately 10 pounds, he now has slight rales in his lungs, his x-ray which was portable anteroposterior chest x-ray shows that he has increased pulmonary markings and likely pulmonary edema.  Give dose of Lasix while labs are pending  The patient has required oxygen by nasal cannula to keep oxygen above 90%. Because of increased fluid retention and need for diuresis the patient will be admitted to the hospital. Discussed with the hospitalist who is in agreement.  Meds given in ED:  Medications  ondansetron (ZOFRAN) injection 4 mg (not administered)  apixaban (ELIQUIS) tablet 5 mg (5 mg Oral Not Given 08/29/13 0301)  ALPRAZolam (XANAX) tablet 0.5 mg (not administered)  aspirin chewable tablet 81 mg (not administered)  lisinopril (PRINIVIL,ZESTRIL) tablet 10 mg (not administered)  metoprolol succinate (TOPROL-XL) 24 hr tablet 50 mg (not administered)  potassium chloride SA (K-DUR,KLOR-CON) CR tablet 20 mEq (not administered)  simvastatin (ZOCOR) tablet 20 mg (not administered)  sodium chloride 0.9 % injection 3 mL (not administered)  sodium chloride 0.9 % injection 3 mL (not administered)  0.9 %  sodium chloride infusion (not administered)   HYDROcodone-acetaminophen (NORCO/VICODIN) 5-325 MG per tablet 1-2 tablet (not administered)  ondansetron (ZOFRAN) tablet 4 mg (not administered)    Or  ondansetron (ZOFRAN) injection 4 mg (not administered)  furosemide (LASIX) injection 20 mg (20 mg Intravenous Not Given 08/29/13 0302)  insulin aspart (novoLOG) injection 0-9 Units (not administered)  0.9 %  sodium chloride infusion ( Intravenous Stopped 08/29/13 0153)  aspirin chewable tablet 324 mg (324 mg Oral Given 08/29/13 0012)  furosemide (LASIX) injection 40 mg (40 mg Intravenous Given 08/29/13 0017)    Current Discharge Medication List        Randy RollerBrian D Jolena Kittle, MD 08/29/13 0140  Randy RollerBrian D Ida Milbrath, MD 08/29/13 [phone]

## 2013-08-29 ENCOUNTER — Encounter (HOSPITAL_COMMUNITY): Payer: Self-pay

## 2013-08-29 DIAGNOSIS — I4891 Unspecified atrial fibrillation: Secondary | ICD-10-CM

## 2013-08-29 DIAGNOSIS — I2510 Atherosclerotic heart disease of native coronary artery without angina pectoris: Secondary | ICD-10-CM

## 2013-08-29 DIAGNOSIS — I5043 Acute on chronic combined systolic (congestive) and diastolic (congestive) heart failure: Principal | ICD-10-CM

## 2013-08-29 DIAGNOSIS — I509 Heart failure, unspecified: Secondary | ICD-10-CM

## 2013-08-29 DIAGNOSIS — E119 Type 2 diabetes mellitus without complications: Secondary | ICD-10-CM

## 2013-08-29 DIAGNOSIS — I10 Essential (primary) hypertension: Secondary | ICD-10-CM

## 2013-08-29 LAB — CBC WITH DIFFERENTIAL/PLATELET
Basophils Absolute: 0.1 10*3/uL (ref 0.0–0.1)
Basophils Relative: 1 % (ref 0–1)
Eosinophils Absolute: 0.3 10*3/uL (ref 0.0–0.7)
Eosinophils Relative: 2 % (ref 0–5)
HCT: 39.2 % (ref 39.0–52.0)
HEMOGLOBIN: 13.3 g/dL (ref 13.0–17.0)
Lymphocytes Relative: 20 % (ref 12–46)
Lymphs Abs: 3 10*3/uL (ref 0.7–4.0)
MCH: 30 pg (ref 26.0–34.0)
MCHC: 33.9 g/dL (ref 30.0–36.0)
MCV: 88.3 fL (ref 78.0–100.0)
Monocytes Absolute: 0.9 10*3/uL (ref 0.1–1.0)
Monocytes Relative: 6 % (ref 3–12)
Neutro Abs: 11 10*3/uL — ABNORMAL HIGH (ref 1.7–7.7)
Neutrophils Relative %: 71 % (ref 43–77)
PLATELETS: 349 10*3/uL (ref 150–400)
RBC: 4.44 MIL/uL (ref 4.22–5.81)
RDW: 13.7 % (ref 11.5–15.5)
WBC: 15.3 10*3/uL — ABNORMAL HIGH (ref 4.0–10.5)

## 2013-08-29 LAB — COMPREHENSIVE METABOLIC PANEL
ALBUMIN: 3.8 g/dL (ref 3.5–5.2)
ALK PHOS: 83 U/L (ref 39–117)
ALT: 25 U/L (ref 0–53)
AST: 22 U/L (ref 0–37)
BILIRUBIN TOTAL: 1.2 mg/dL (ref 0.3–1.2)
BUN: 27 mg/dL — AB (ref 6–23)
CHLORIDE: 96 meq/L (ref 96–112)
CO2: 30 meq/L (ref 19–32)
CREATININE: 1.12 mg/dL (ref 0.50–1.35)
Calcium: 9.1 mg/dL (ref 8.4–10.5)
GFR calc Af Amer: 73 mL/min — ABNORMAL LOW (ref >90)
GFR, EST NON AFRICAN AMERICAN: 63 mL/min — AB (ref >90)
Glucose, Bld: 121 mg/dL — ABNORMAL HIGH (ref 70–99)
POTASSIUM: 3.5 meq/L — AB (ref 3.7–5.3)
Sodium: 141 mEq/L (ref 137–147)
Total Protein: 7.1 g/dL (ref 6.0–8.3)

## 2013-08-29 LAB — GLUCOSE, CAPILLARY
GLUCOSE-CAPILLARY: 83 mg/dL (ref 70–99)
GLUCOSE-CAPILLARY: 96 mg/dL (ref 70–99)
Glucose-Capillary: 113 mg/dL — ABNORMAL HIGH (ref 70–99)
Glucose-Capillary: 115 mg/dL — ABNORMAL HIGH (ref 70–99)

## 2013-08-29 LAB — CBC
HCT: 37.2 % — ABNORMAL LOW (ref 39.0–52.0)
Hemoglobin: 12.6 g/dL — ABNORMAL LOW (ref 13.0–17.0)
MCH: 30.1 pg (ref 26.0–34.0)
MCHC: 33.9 g/dL (ref 30.0–36.0)
MCV: 88.8 fL (ref 78.0–100.0)
PLATELETS: 334 10*3/uL (ref 150–400)
RBC: 4.19 MIL/uL — AB (ref 4.22–5.81)
RDW: 13.8 % (ref 11.5–15.5)
WBC: 12.2 10*3/uL — AB (ref 4.0–10.5)

## 2013-08-29 LAB — BASIC METABOLIC PANEL
BUN: 30 mg/dL — ABNORMAL HIGH (ref 6–23)
CALCIUM: 9.4 mg/dL (ref 8.4–10.5)
CO2: 28 meq/L (ref 19–32)
Chloride: 95 mEq/L — ABNORMAL LOW (ref 96–112)
Creatinine, Ser: 1.14 mg/dL (ref 0.50–1.35)
GFR calc Af Amer: 71 mL/min — ABNORMAL LOW (ref >90)
GFR calc non Af Amer: 61 mL/min — ABNORMAL LOW (ref >90)
Glucose, Bld: 127 mg/dL — ABNORMAL HIGH (ref 70–99)
POTASSIUM: 3.5 meq/L — AB (ref 3.7–5.3)
SODIUM: 139 meq/L (ref 137–147)

## 2013-08-29 LAB — URINALYSIS, ROUTINE W REFLEX MICROSCOPIC
Bilirubin Urine: NEGATIVE
Glucose, UA: NEGATIVE mg/dL
Hgb urine dipstick: NEGATIVE
Ketones, ur: NEGATIVE mg/dL
Leukocytes, UA: NEGATIVE
NITRITE: NEGATIVE
Protein, ur: NEGATIVE mg/dL
SPECIFIC GRAVITY, URINE: 1.01 (ref 1.005–1.030)
UROBILINOGEN UA: 0.2 mg/dL (ref 0.0–1.0)
pH: 5 (ref 5.0–8.0)

## 2013-08-29 LAB — PRO B NATRIURETIC PEPTIDE: PRO B NATRI PEPTIDE: 1476 pg/mL — AB (ref 0–125)

## 2013-08-29 LAB — APTT: APTT: 45 s — AB (ref 24–37)

## 2013-08-29 LAB — PROTIME-INR
INR: 1.51 — ABNORMAL HIGH (ref 0.00–1.49)
PROTHROMBIN TIME: 17.8 s — AB (ref 11.6–15.2)

## 2013-08-29 LAB — TROPONIN I: Troponin I: <0.3 ng/mL (ref <0.30)

## 2013-08-29 MED ORDER — ONDANSETRON HCL 4 MG PO TABS: 4.0000 mg | ORAL_TABLET | Freq: Four times a day (QID) | ORAL | Status: DC | PRN

## 2013-08-29 MED ORDER — SODIUM CHLORIDE 0.9 % IJ SOLN
3.0000 mL | Freq: Two times a day (BID) | INTRAMUSCULAR | Status: DC
  Administered 2013-08-29 – 2013-08-30 (×3): 3 mL via INTRAVENOUS

## 2013-08-29 MED ORDER — METOPROLOL SUCCINATE ER 50 MG PO TB24
50.0000 mg | ORAL_TABLET | Freq: Two times a day (BID) | ORAL | Status: DC
  Administered 2013-08-29 – 2013-08-30 (×3): 50 mg via ORAL
  Filled 2013-08-29 (×3): qty 1

## 2013-08-29 MED ORDER — ONDANSETRON HCL 4 MG/2ML IJ SOLN: 4.0000 mg | Freq: Four times a day (QID) | INTRAMUSCULAR | Status: DC | PRN

## 2013-08-29 MED ORDER — ONDANSETRON HCL 4 MG/2ML IJ SOLN: 4.0000 mg | Freq: Three times a day (TID) | INTRAMUSCULAR | Status: AC | PRN

## 2013-08-29 MED ORDER — SODIUM CHLORIDE 0.9 % IV SOLN
250.0000 mL | INTRAVENOUS | Status: DC | PRN
  Administered 2013-08-29: 250 mL via INTRAVENOUS

## 2013-08-29 MED ORDER — HYDROCODONE-ACETAMINOPHEN 5-325 MG PO TABS: 1.0000 | ORAL_TABLET | ORAL | Status: DC | PRN

## 2013-08-29 MED ORDER — ASPIRIN 81 MG PO CHEW
81.0000 mg | CHEWABLE_TABLET | Freq: Every day | ORAL | Status: DC
  Administered 2013-08-29 – 2013-08-30 (×2): 81 mg via ORAL
  Filled 2013-08-29 (×2): qty 1

## 2013-08-29 MED ORDER — POTASSIUM CHLORIDE CRYS ER 20 MEQ PO TBCR
20.0000 meq | EXTENDED_RELEASE_TABLET | Freq: Every day | ORAL | Status: DC
  Administered 2013-08-29 – 2013-08-30 (×2): 20 meq via ORAL
  Filled 2013-08-29 (×3): qty 1

## 2013-08-29 MED ORDER — SODIUM CHLORIDE 0.9 % IJ SOLN: 3.0000 mL | INTRAMUSCULAR | Status: DC | PRN

## 2013-08-29 MED ORDER — SIMVASTATIN 20 MG PO TABS
20.0000 mg | ORAL_TABLET | Freq: Every day | ORAL | Status: DC
  Administered 2013-08-29: 20 mg via ORAL
  Filled 2013-08-29: qty 1

## 2013-08-29 MED ORDER — FUROSEMIDE 10 MG/ML IJ SOLN
40.0000 mg | INTRAMUSCULAR | Status: AC
  Administered 2013-08-29: 40 mg via INTRAVENOUS
  Filled 2013-08-29: qty 4

## 2013-08-29 MED ORDER — INSULIN ASPART 100 UNIT/ML ~~LOC~~ SOLN
0.0000 [IU] | Freq: Three times a day (TID) | SUBCUTANEOUS | Status: DC
  Administered 2013-08-30 (×2): 1 [IU] via SUBCUTANEOUS

## 2013-08-29 MED ORDER — FUROSEMIDE 10 MG/ML IJ SOLN
20.0000 mg | Freq: Two times a day (BID) | INTRAMUSCULAR | Status: DC
  Administered 2013-08-29 – 2013-08-30 (×2): 20 mg via INTRAVENOUS
  Filled 2013-08-29 (×2): qty 2

## 2013-08-29 MED ORDER — LISINOPRIL 10 MG PO TABS
10.0000 mg | ORAL_TABLET | Freq: Every day | ORAL | Status: DC
  Administered 2013-08-29 – 2013-08-30 (×2): 10 mg via ORAL
  Filled 2013-08-29: qty 1
  Filled 2013-08-29: qty 2

## 2013-08-29 MED ORDER — ALPRAZOLAM 0.5 MG PO TABS: 0.5000 mg | ORAL_TABLET | Freq: Every day | ORAL | Status: DC | PRN

## 2013-08-29 MED ORDER — APIXABAN 5 MG PO TABS
5.0000 mg | ORAL_TABLET | Freq: Two times a day (BID) | ORAL | Status: DC
  Administered 2013-08-29 – 2013-08-30 (×3): 5 mg via ORAL
  Filled 2013-08-29 (×3): qty 1

## 2013-08-29 NOTE — Progress Notes (Signed)
[facility] PROGRESS NOTE  Marjie Skiffrnest B Molenda FAO:[id number] DOB: [birth year] DOA: 08/28/2013 PCP: No PCP Per Patient  Assessment/Plan: 73 y/o male with PMH of HTN, HPL, DM, CAD, CHF admitted with CHF exacerbation likely due to not taking meds   1. Acute on chronic CHF; CXR: congestion; echo (08/2013): LVEF 45%, dilated cardiomyopathy  -improving o IV diuresis; cont IV lasix, I/O neg; cont ACE, BB, ASA  2. A fib; HR comntrolled; cont apixaban, BB  3. DM  HA1C-6.6; hold PO meds; cont ISS while inpatient   4. CAD cont home regimen   Code Status: full Family Communication: d/ w patient (indicate person spoken with, relationship, and if by phone, the number) Disposition Plan: home 24-48 hours    Consultants:  Cardiology   Procedures:  None   Antibiotics:  None  (indicate start date, and stop date if known)  HPI/Subjective: alert  Objective: Filed Vitals:   08/29/13 0545  BP: 125/49  Pulse: 63  Temp: 98 F (36.7 C)  Resp: 20    Intake/Output Summary (Last 24 hours) at 08/29/13 0948 Last data filed at 08/29/13 0537  Gross per 24 hour  Intake      0 ml  Output   1950 ml  Net  -1950 ml   Filed Weights   08/28/13 2236 08/29/13 0209 08/29/13 0700  Weight: 95.255 kg (210 lb) 95.8 kg (211 lb 3.2 oz) 93.441 kg (206 lb)    Exam:   General:  alert  Cardiovascular: s1,s2 rrr  Respiratory: few crackle sin LL  Abdomen: soft, nt  Musculoskeletal: no LE edema    Data Reviewed: Basic Metabolic Panel:  Recent Labs Lab 08/23/13 0549 08/28/13 0025 08/29/13 0456  NA 143 139 141  K 3.6* 3.5* 3.5*  CL 101 95* 96  CO2 27 28 30   GLUCOSE 126* 127* 121*  BUN 13 30* 27*  CREATININE 0.74 1.14 1.12  CALCIUM 8.7 9.4 9.1   Liver Function Tests:  Recent Labs Lab 08/29/13 0456  AST 22  ALT 25  ALKPHOS 83  BILITOT 1.2  PROT 7.1  ALBUMIN 3.8   No results found for this basename: LIPASE, AMYLASE,  in the last 168 hours No results found for this basename:  AMMONIA,  in the last 168 hours CBC:  Recent Labs Lab 08/23/13 0549 08/28/13 0025 08/29/13 0456  WBC 8.0 15.3* 12.2*  NEUTROABS  --  11.0*  --   HGB 12.9* 13.3 12.6*  HCT 38.3* 39.2 37.2*  MCV 89.7 88.3 88.8  PLT 239 349 334   Cardiac Enzymes:  Recent Labs Lab 08/28/13 0025  TROPONINI <0.30   BNP (last 3 results)  Recent Labs  08/17/13 2050 08/28/13 0025  PROBNP 5197.0* 1476.0*   CBG:  Recent Labs Lab 08/22/13 1630 08/22/13 2216 08/23/13 0647 08/23/13 1103 08/29/13 0741  GLUCAP 124* 123* 165* 129* 83    No results found for this or any previous visit (from the past 240 hour(s)).   Studies: Dg Chest Port 1 View  08/29/2013   CLINICAL DATA:  Shortness of breath  EXAM: PORTABLE CHEST - 1 VIEW  COMPARISON:  08/22/2013  FINDINGS: Fall cardiac enlargement. Central vascular congestion. Interstitial prominence. Cannot exclude a retrocardiac process. Small pleural effusions. Median sternotomy. Limited osseous evaluation due to technique/patient body habitus.  IMPRESSION: Cardiac enlargement and central vascular congestion. Small pleural effusions. Interstitial prominence may reflect interstitial edema.   Electronically Signed   By: Jearld LeschAndrew  DelGaizo M.D.   On: 08/29/2013 00:56  Scheduled Meds: . apixaban  5 mg Oral BID  . aspirin  81 mg Oral Daily  . furosemide  20 mg Intravenous Q12H  . insulin aspart  0-9 Units Subcutaneous TID WC  . lisinopril  10 mg Oral Daily  . metoprolol succinate  50 mg Oral BID WC  . potassium chloride SA  20 mEq Oral Daily  . simvastatin  20 mg Oral q1800  . sodium chloride  3 mL Intravenous Q12H   Continuous Infusions:   Active Problems:   CAD (coronary artery disease)   Diabetes mellitus, type 2   Essential hypertension, benign   Acute on chronic combined systolic and diastolic CHF (congestive heart failure)   CHF (congestive heart failure)   CHF exacerbation    Time spent: >35 minutes     Esperanza Sheets  [facility] Pager [phone]. If 7PM-7AM, please contact night-coverage at [email or web address], password [facility] 08/29/2013, 9:48 AM  LOS: 1 day

## 2013-08-29 NOTE — Progress Notes (Signed)
Pt's O2 saturation, sitting, on room air 98-100%.  Pt ambulated in hallway with standby assist from RN.  Pt ambulated on room air. Pt's O2 saturation ranged from 92-98% on room air.  Pt tolerated well. Will continue to encourage ambulation.

## 2013-08-29 NOTE — Consult Note (Signed)
CARDIOLOGY CONSULT NOTE   Patient ID: Randy Nunez MRN: [id number] DOB/AGE: 12/19/1940 73 y.o.  Admit Date: 08/28/2013 Referring Physician: PTH Primary Physician: No PCP Per Patient Consulting Cardiologist: Camella Seim Primary Cardiologist Donnie Aho to be assigned to [facility] Reason for Consultation: CHF Exacerbation  Clinical Summary Randy Nunez is a 73 y.o.male admitted with worsening dyspnea and CHF. He was recently discharged from [facility] hospital after lengthy admission which began with Afib RVR, VDRF with pulmonary edema, with hx of CAD with CABG in 2001. He has a history of diabetes, hypertension, anxiety and systolic dysfunction.    .   He had cardiac cath on 08/21/2013  Which demonstrated patent grafts with the exception of total occlusion of SVG to diag. He was to be treated medically per cardiology. Atrial fib was new diagnosis. He was started on apixaban with plans for DCCV after one month of anticoagulation. Echo at that time demonstrated EF of 40-45%. There was a question of tachycardic mediated CM vs ICM. He was continued on ACE, BB and statin. He was diuresed 13 lbs (wt down to 202 lbs) during hospitalization and discharged on 08/23/2013. Actos was discontinued.       Patient states he did not have any lasix when he was discharged home on 08/22/2013. It took him until yesterday to get if filled due to insurance denial of it. States he had just filled the Rx before being admitted to [facility] on 08/17/2013. He took his first dose yesterday of lasix. He was taking all other medications, including apixaban. He states his breathing status had been worsening for 3 days, but symptoms very bad on day of admission with wheezing and some LEE. He did not want to "have the tube put back down my throat" so he came to ER.     In ER he was given IV lasix 40 mg and ASA. CXR demonstrated central vascular congestion,small pleural effusions and interstitial prominence which may reflect interstitial edema  He has since  diuresed 4 lbs. He is breathing better but continues on O2. He denies chest pain or dizziness. He did not  feel his heart racing. He admits to eating salty foods to include sausage, fried fish sandwich and other salty foods since returning home.    Allergies  Allergen Reactions  . Penicillins     Medications Scheduled Medications: . apixaban  5 mg Oral BID  . aspirin  81 mg Oral Daily  . furosemide  20 mg Intravenous Q12H  . insulin aspart  0-9 Units Subcutaneous TID WC  . lisinopril  10 mg Oral Daily  . metoprolol succinate  50 mg Oral BID WC  . potassium chloride SA  20 mEq Oral Daily  . simvastatin  20 mg Oral q1800  . sodium chloride  3 mL Intravenous Q12H    PRN Medications: sodium chloride, ALPRAZolam, HYDROcodone-acetaminophen, ondansetron (ZOFRAN) IV, ondansetron (ZOFRAN) IV, ondansetron, sodium chloride   Past Medical History  Diagnosis Date  . Diabetes mellitus without complication   . Hypertension   . CAD (coronary artery disease)     a. s/p CABG 2001. b. NSTEMI 08/2013 - cath done, med rx recommended.  . Atrial fibrillation     a. Dx 08/2013. Placed on eliquis. Plan DCCV as outpt. Also has had WCT c/w aberrancy.  . Chronic combined systolic and diastolic CHF (congestive heart failure)   . Acute respiratory failure     a. 08/2013 - VDRF due to pulm edema.    Past Surgical History  Procedure Laterality Date  . Left heart cath  2015    Dr Allyson Sabal, with stenting  . Coronary artery bypass graft  2001    Family History  Problem Relation Age of Onset  . Hypertension    . Hyperlipidemia      Social History Mr. Kope reports that he has never smoked. He does not have any smokeless tobacco history on file. Mr. Mayeux reports that he does not drink alcohol.  Review of Systems Otherwise reviewed and negative except as outlined.  Physical Examination Blood pressure 125/49, pulse 63, temperature 98 F (36.7 C), temperature source Oral, resp. rate 20, height 5\' 11"   (1.803 m), weight 206 lb (93.441 kg), SpO2 94.00%.  Intake/Output Summary (Last 24 hours) at 08/29/13 0825 Last data filed at 08/29/13 0537  Gross per 24 hour  Intake      0 ml  Output   1950 ml  Net  -1950 ml    Telemetry: Atrial fibrillation  HEENT: Conjunctiva and lids normal, oropharynx clear with moist mucosa. Neck: Supple, no elevated JVP or carotid bruits, no thyromegaly. Lungs: Clear to auscultation, nonlabored breathing at rest. Cardiac: Irregular rate and rhythm, no S3 or significant systolic murmur, no pericardial rub. Abdomen: Soft, nontender, no hepatomegaly, bowel sounds present, no guarding or rebound. Extremities: 1+ pitting pretibial edema, distal pulses 2+. Skin: Warm and dry. Musculoskeletal: No kyphosis. Neuropsychiatric: Alert and oriented x3, affect grossly appropriate.  Prior Cardiac Testing/Procedures 1. Echocardiogram 08/18/2013 Left ventricle: The cavity size was mildly dilated. Wall thickness was increased in a pattern of mild LVH. Systolic function was mildly to moderately reduced. The estimated ejection fraction was in the range of 40% to 45%. Wall motion was normal; there were no regional wall motion abnormalities. - Left atrium: The atrium was moderately dilated. - Right ventricle: The cavity size was moderately dilated. Wall thickness was normal. Systolic function was moderately reduced. - Right atrium: The atrium was moderately dilated. - Pulmonary arteries: Systolic pressure was mildly increased. PA peak pressure: 39mm Hg (S).  2. Cardiac Cath 08/21/2013 1. Severe native three-vessel coronary artery disease  2. Status post aortocoronary bypass surgery with continued patency of the LIMA to LAD, saphenous vein graft to PDA, and sequential saphenous vein graft to OM1 and OM 2.  3. Total occlusion of the saphenous vein graft to diagonal  4. Moderate LV systolic dysfunction  Recommendations: Medical therapy for CAD and congestive heart failure. I  do not see any options for revascularization. Will reduce aspirin dose to 81 mg daily. Would not use any other antiplatelet therapies as this patient will require long-term anticoagulation in the setting of his atrial fibrillation.  Lab Results  Basic Metabolic Panel:  Recent Labs Lab 08/23/13 0549 08/28/13 0025 08/29/13 0456  NA 143 139 141  K 3.6* 3.5* 3.5*  CL 101 95* 96  CO2 27 28 30   GLUCOSE 126* 127* 121*  BUN 13 30* 27*  CREATININE 0.74 1.14 1.12  CALCIUM 8.7 9.4 9.1    Liver Function Tests:  Recent Labs Lab 08/29/13 0456  AST 22  ALT 25  ALKPHOS 83  BILITOT 1.2  PROT 7.1  ALBUMIN 3.8    CBC:  Recent Labs Lab 08/23/13 0549 08/28/13 0025 08/29/13 0456  WBC 8.0 15.3* 12.2*  NEUTROABS  --  11.0*  --   HGB 12.9* 13.3 12.6*  HCT 38.3* 39.2 37.2*  MCV 89.7 88.3 88.8  PLT 239 349 334    Cardiac Enzymes:  Recent Labs Lab 08/28/13  0025  TROPONINI <0.30    BNP: 1,476.   Radiology: Dg Chest Port 1 View  08/29/2013   CLINICAL DATA:  Shortness of breath  EXAM: PORTABLE CHEST - 1 VIEW  COMPARISON:  08/22/2013  FINDINGS: Fall cardiac enlargement. Central vascular congestion. Interstitial prominence. Cannot exclude a retrocardiac process. Small pleural effusions. Median sternotomy. Limited osseous evaluation due to technique/patient body habitus.  IMPRESSION: Cardiac enlargement and central vascular congestion. Small pleural effusions. Interstitial prominence may reflect interstitial edema.   Electronically Signed   By: Jearld LeschAndrew  DelGaizo M.D.   On: 08/29/2013 00:56    ECG: Atrial fib with motion artifact. Rate of 62 bpm.   Impression and Recommendations  1.Acute on Chronic Systolic CHF: Multifactorial. He had not been taking lasix since discharge on 08/22/2013 due to mix up on insurance coverage. He states he took his first dose yesterday due to worsening breathing. He had also had some dietary indiscretion. He has diuresed 4 lbs since admission with  improvement in symptoms. Still not at "dry wt" of 202 lbs which was documented on recent discharge.  Would continue one more day of IV lasix to get to dry wt. Then transition back to po dose of 40 mg BID or even daily once he is compensated.. Continue ACE. Follow creatinine. Most recent is 1.12 this am, up from 0.74 on discharge.   2.Atrial fibrillation: He is rate controlled currently on metoprolol. Apixaban continues. Plans for possible DCCV after one month on anticoagulation.   3. CAD: S/P cardiac cath, with hx of CABG in 2001. Severe native vessel disease with grafts patent with the ecsption of SVG to diag. Continue medical management with ASA, BB, and statin.  4.. Diabetes: Actos discontinued on last admission. Currently controlled on sliding scale insulin. He was on glipizide/ metformin BID at home. There was some concern about metformin use post cath on notes from previous admission.. No evidence of contrast induced nephropathy currently. Stable   Signed: Bettey MareKathryn M. Lyman BishopLawrence NP [facility] 08/29/2013, 8:25 AM Co-Sign MD   Attending Note Patient seen and discussed with NP Lyman BishopLawrence. 73 yo male hx of chronic systolic and diastolic heart failure with most recent echo Jan 2015 LVEF 40-45%, CAD with prior CABG, afib admitted with SOB. Last cath Aug 17, 2013 with severe native disease with patent LIMA-LAD, SVG -PDA, and SVG to OM1 and OM2. SVG to D occluded, recommendations for medical therapy, no options for revascularzation. He has also recently been started on eliquis for afib with plans for outpatient DCCV. Patient reports being off lasix for a period as described above, presents with 3 days of increasing SOB, wheezing, and LE edema. CXR showed pulmondary edema, pro-BNP 1476, troponin neg x 1, EKG with poor baseline and afib. He has been started on IV diuretics and is net negative 2.2 liters since admission with stable renal function. Follow symptoms of oxygen and ambulate patient, if  does well consider discharge later today back on oral lasix dose. If persistent symptoms continue IV lasix today and consider d/c tomorrow.  Dina RichJonathan Ho Parisi MD

## 2013-08-29 NOTE — Progress Notes (Signed)
Utilization Review Complete  

## 2013-08-29 NOTE — H&P (Addendum)
PCP:   No PCP Per Patient   Chief Complaint:  Shortness of breath  HPI:  73 year old male who  has a past medical history of Diabetes mellitus without complication; Hypertension; CAD (coronary artery disease); Atrial fibrillation; Chronic combined systolic and diastolic CHF (congestive heart failure); and Acute respiratory failure.who was recently discharged from the [facility] after he was treated for atrial fibrillation and CHF exacerbation. Patient was diuresed and sent home. At that time patient also underwent cardiac catheterization that showed that he had total occlusion of the saphenous vein graft to the left circumflex which was amenable only to medical therapy but no other intervention.He was started on anticoagulants secondary to his new onset A. fib and the plan was for outpatient cardioversion after 4 weeks of anticoagulation As the patient he developed shortness of breath yesterday while laying in the bed and he could not lie flat. He denies chest pain no fever no nausea vomiting or diarrhea no dysuria urgency or frequency of urination. In the ED patient was found to have leukocytosis with WBC of 15,000. With B.N P. of 1476.0.    Allergies:   Allergies  Allergen Reactions  . Penicillins       Past Medical History  Diagnosis Date  . Diabetes mellitus without complication   . Hypertension   . CAD (coronary artery disease)     a. s/p CABG 2001. b. NSTEMI 08/2013 - cath done, med rx recommended.  . Atrial fibrillation     a. Dx 08/2013. Placed on eliquis. Plan DCCV as outpt. Also has had WCT c/w aberrancy.  . Chronic combined systolic and diastolic CHF (congestive heart failure)   . Acute respiratory failure     a. 08/2013 - VDRF due to pulm edema.    Past Surgical History  Procedure Laterality Date  . Left heart cath  2015    Dr Gwenlyn Found, with stenting  . Coronary artery bypass graft  2001    Prior to Admission medications   Medication Sig Start Date End Date  Taking? Authorizing Provider  ALPRAZolam Duanne Moron) 0.5 MG tablet Take 0.5 mg by mouth daily as needed for anxiety.    Historical Provider, MD  apixaban (ELIQUIS) 5 MG TABS tablet Take 1 tablet (5 mg total) by mouth 2 (two) times daily. 08/24/13   Dorothy Spark, MD  aspirin 81 MG chewable tablet Chew 1 tablet (81 mg total) by mouth daily. 08/23/13   Cherene Altes, MD  furosemide (LASIX) 40 MG tablet Take 1 tablet (40 mg total) by mouth 2 (two) times daily. 08/23/13   Cherene Altes, MD  glipiZIDE-metformin (METAGLIP) 5-500 MG per tablet Take 2 tablets by mouth 2 (two) times daily.    Historical Provider, MD  lisinopril (PRINIVIL,ZESTRIL) 10 MG tablet Take 1 tablet (10 mg total) by mouth daily. 08/23/13   Cherene Altes, MD  metoprolol succinate (TOPROL-XL) 50 MG 24 hr tablet Take 1 tablet (50 mg total) by mouth 2 (two) times daily. Take with or immediately following a meal. 08/23/13   Cherene Altes, MD  potassium chloride SA (K-DUR,KLOR-CON) 20 MEQ tablet Take 1 tablet (20 mEq total) by mouth daily. 08/24/13   Cherene Altes, MD  pravastatin (PRAVACHOL) 40 MG tablet Take 40 mg by mouth at bedtime.    Historical Provider, MD    Social History:  reports that he has never smoked. He does not have any smokeless tobacco history on file. He reports that he does not drink alcohol  or use illicit drugs.  Family History  Problem Relation Age of Onset  . Hypertension    . Hyperlipidemia       All the positives are listed in BOLD  Review of Systems:  HEENT: Headache, blurred vision, runny nose, sore throat Neck: Hypothyroidism, hyperthyroidism,,lymphadenopathy Chest : Shortness of breath, history of COPD, Asthma Heart : Chest pain, history of coronary arterey disease GI:  Nausea, vomiting, diarrhea, constipation, GERD GU: Dysuria, urgency, frequency of urination, hematuria Neuro: Stroke, seizures, syncope Psych: Depression, anxiety, hallucinations   Physical Exam: Blood pressure  114/54, pulse 63, temperature 97.3 F (36.3 C), temperature source Oral, resp. rate 18, height _0  (1.803 m), weight 95.255 kg (210 lb), SpO2 94.00%. Constitutional:   Patient is a well-developed and well-nourished male* in no acute distress and cooperative with exam. Head: Normocephalic and atraumatic Mouth: Mucus membranes moist Eyes: PERRL, EOMI, conjunctivae normal Neck: Supple, No Thyromegaly Cardiovascular: RRR, S1 normal, S2 normal Pulmonary/Chest: Bibasilar crackles Abdominal: Soft. Non-tender, non-distended, bowel sounds are normal, no masses, organomegaly, or guarding present.  Neurological: A&O x3, Strenght is normal and symmetric bilaterally, cranial nerve II-XII are grossly intact, no focal motor deficit, sensory intact to light touch bilaterally.  Extremities : No Cyanosis, Clubbing or Edema   Labs on Admission:  Results for orders placed during the hospital encounter of 08/28/13 (from the past 48 hour(s))  BASIC METABOLIC PANEL     Status: Abnormal   Collection Time    08/28/13 12:25 AM      Result Value Range   Sodium 139  137 - 147 mEq/L   Potassium 3.5 (*) 3.7 - 5.3 mEq/L   Chloride 95 (*) 96 - 112 mEq/L   CO2 28  19 - 32 mEq/L   Glucose, Bld 127 (*) 70 - 99 mg/dL   BUN 30 (*) 6 - 23 mg/dL   Creatinine, Ser 1.14  0.50 - 1.35 mg/dL   Calcium 9.4  8.4 - 10.5 mg/dL   GFR calc non Af Amer 61 (*) >90 mL/min   GFR calc Af Amer 71 (*) >90 mL/min   Comment: (NOTE)     The eGFR has been calculated using the CKD EPI equation.     This calculation has not been validated in all clinical situations.     eGFR's persistently <90 mL/min signify possible Chronic Kidney     Disease.  CBC WITH DIFFERENTIAL     Status: Abnormal   Collection Time    08/28/13 12:25 AM      Result Value Range   WBC 15.3 (*) 4.0 - 10.5 K/uL   RBC 4.44  4.22 - 5.81 MIL/uL   Hemoglobin 13.3  13.0 - 17.0 g/dL   HCT 39.2  39.0 - 52.0 %   MCV 88.3  78.0 - 100.0 fL   MCH 30.0  26.0 - 34.0 pg    MCHC 33.9  30.0 - 36.0 g/dL   RDW 13.7  11.5 - 15.5 %   Platelets 349  150 - 400 K/uL   Neutrophils Relative % 71  43 - 77 %   Neutro Abs 11.0 (*) 1.7 - 7.7 K/uL   Lymphocytes Relative 20  12 - 46 %   Lymphs Abs 3.0  0.7 - 4.0 K/uL   Monocytes Relative 6  3 - 12 %   Monocytes Absolute 0.9  0.1 - 1.0 K/uL   Eosinophils Relative 2  0 - 5 %   Eosinophils Absolute 0.3  0.0 - 0.7 K/uL  Basophils Relative 1  0 - 1 %   Basophils Absolute 0.1  0.0 - 0.1 K/uL  APTT     Status: Abnormal   Collection Time    08/28/13 12:25 AM      Result Value Range   aPTT 45 (*) 24 - 37 seconds   Comment:            IF BASELINE aPTT IS ELEVATED,     SUGGEST PATIENT RISK ASSESSMENT     BE USED TO DETERMINE APPROPRIATE     ANTICOAGULANT THERAPY.  PROTIME-INR     Status: Abnormal   Collection Time    08/28/13 12:25 AM      Result Value Range   Prothrombin Time 17.8 (*) 11.6 - 15.2 seconds   INR 1.51 (*) 0.00 - 1.49  TROPONIN I     Status: None   Collection Time    08/28/13 12:25 AM      Result Value Range   Troponin I <0.30  <0.30 ng/mL   Comment:            Due to the release kinetics of cTnI,     a negative result within the first hours     of the onset of symptoms does not rule out     myocardial infarction with certainty.     If myocardial infarction is still suspected,     repeat the test at appropriate intervals.  PRO B NATRIURETIC PEPTIDE     Status: Abnormal   Collection Time    08/28/13 12:25 AM      Result Value Range   Pro B Natriuretic peptide (BNP) 1476.0 (*) 0 - 125 pg/mL    Radiological Exams on Admission: Dg Chest Port 1 View  08/29/2013   CLINICAL DATA:  Shortness of breath  EXAM: PORTABLE CHEST - 1 VIEW  COMPARISON:  08/22/2013  FINDINGS: Fall cardiac enlargement. Central vascular congestion. Interstitial prominence. Cannot exclude a retrocardiac process. Small pleural effusions. Median sternotomy. Limited osseous evaluation due to technique/patient body habitus.  IMPRESSION:  Cardiac enlargement and central vascular congestion. Small pleural effusions. Interstitial prominence may reflect interstitial edema.   Electronically Signed   By: Carlos Levering M.D.   On: 08/29/2013 00:56    Assessment/Plan Active Problems:   CAD (coronary artery disease)   Diabetes mellitus, type 2   Essential hypertension, benign   Acute on chronic combined systolic and diastolic CHF (congestive heart failure)    Acute on chronic combined systolic and diastolic CHF Patient will be started on IV Lasix 20 mg IV every 12 hours. We'll consult cardiology in the a.m. Will check intake and output Follow BMP in the morning   History of CAD Patient will be continued on his home medications Which includes aspirin, pravastatin, metoprolol  Diabetes mellitus Will hold glipizide/metformin at this time Patient will be started on sliding scale insulin  Atrial fibrillation Patient is on anticoagulation with Eliquis  Leukocytosis ? Cause Chest x-ray does not show infiltrate Will check a UA and culture Patient has been afebrile Will check WBC the morning   Code status: Patient is full code  Family discussion: No family at bedside   Time Spent on Admission: 27 minutes  [facility] Pager: [phone] 08/29/2013, 1:49 AM  If 7PM-7AM, please contact night-coverage  [email or web address]  Password [email or web address]

## 2013-08-30 DIAGNOSIS — I5023 Acute on chronic systolic (congestive) heart failure: Secondary | ICD-10-CM

## 2013-08-30 LAB — BASIC METABOLIC PANEL
BUN: 16 mg/dL (ref 6–23)
CHLORIDE: 98 meq/L (ref 96–112)
CO2: 31 meq/L (ref 19–32)
Calcium: 9.2 mg/dL (ref 8.4–10.5)
Creatinine, Ser: 0.79 mg/dL (ref 0.50–1.35)
GFR calc Af Amer: >90 mL/min (ref >90)
GFR calc non Af Amer: 86 mL/min — ABNORMAL LOW (ref >90)
Glucose, Bld: 134 mg/dL — ABNORMAL HIGH (ref 70–99)
POTASSIUM: 3.5 meq/L — AB (ref 3.7–5.3)
Sodium: 141 mEq/L (ref 137–147)

## 2013-08-30 LAB — GLUCOSE, CAPILLARY
GLUCOSE-CAPILLARY: 139 mg/dL — AB (ref 70–99)
Glucose-Capillary: 127 mg/dL — ABNORMAL HIGH (ref 70–99)

## 2013-08-30 LAB — URINE CULTURE

## 2013-08-30 NOTE — Discharge Summary (Signed)
Physician Discharge Summary  Randy Nunez ZOX:[id number] DOB: 02-27-1941 DOA: 08/28/2013  PCP: No PCP Per Patient  Admit date: 08/28/2013 Discharge date: 08/30/2013  Recommendations for Outpatient Follow-up:  Follow up with cardio per scheduled appt 1/29 Also, f/u with PCP in 2 weeks from discharge to make usre symptoms are controlled  Discharge Diagnoses:  Active Problems:   CAD (coronary artery disease)   Diabetes mellitus, type 2   Essential hypertension, benign   Acute on chronic combined systolic and diastolic CHF (congestive heart failure)   CHF (congestive heart failure)   CHF exacerbation    Discharge Condition: pt insists on going home today; medically stable for discharge per cario   Diet recommendation: as tolerated   History of present illness:  73 y/o male with PMH of HTN, HPL, DM, CAD, CHF admitted with CHF exacerbation likely due to not taking meds   Assessment/Plan:   Principal Problem: Acute on chronic combined systolic and diastolic heart failure  - total negative 5.4 liters since admissions. Cr improved with diuresis which is consistent with venous congestion.  - from cardiac standpoint pt is appropriate for discharge, may resume home oral diuretics.  Active Problems: Atrial fibrillation - rate controlled and on anticoagulatio Coronary artery disease - no current symptoms - may resume home meds  Code Status: full  Family Communication: d/ w patient   Consultants:  Cardiology  Procedures:  None  Antibiotics:  None (indicate start date, and stop date if known)   Signed:  Manson Passey, MD  [facility] 08/30/2013, 11:15 AM  Pager #: [phone]   Discharge Exam: Filed Vitals:   08/30/13 0535  BP: 150/80  Pulse: 85  Temp: 97.9 F (36.6 C)  Resp: 20   Filed Vitals:   08/29/13 1608 08/29/13 2100 08/30/13 0535 08/30/13 0620  BP: 118/78 145/67 150/80   Pulse: 77 78 85   Temp: 98 F (36.7 C) 98.3 F (36.8 C) 97.9 F (36.6 C)    TempSrc:  Oral    Resp: 18 20 20    Height:      Weight:    93.1 kg (205 lb 4 oz)  SpO2: 98% 92% 100%     General: Pt is alert, follows commands appropriately, not in acute distress Cardiovascular: irregular rhythm, S1/S2 appreciated Respiratory: Clear to auscultation bilaterally, no wheezing, no crackles, no rhonchi Abdominal: Soft, non tender, non distended, bowel sounds +, no guarding Extremities: no edema, no cyanosis, pulses palpable bilaterally DP and PT Neuro: Grossly nonfocal  Discharge Instructions  Discharge Orders   Future Appointments Provider Department Dept Phone   09/13/2013 9:30 AM [facility] [place] Office [phone]   Future Orders Complete By Expires   Call MD for:  difficulty breathing, headache or visual disturbances  As directed    Call MD for:  persistant dizziness or light-headedness  As directed    Call MD for:  persistant nausea and vomiting  As directed    Call MD for:  severe uncontrolled pain  As directed    Diet - low sodium heart healthy  As directed    Discharge instructions  As directed    Comments:     1. Please continue same medications at home as before 2. Follow up with cardio 1/29 per scheduled appt time   Increase activity slowly  As directed        Medication List         ALPRAZolam 0.5 MG tablet  Commonly known as:  XANAX  Take 0.5 mg by mouth daily as needed for anxiety.     apixaban 5 MG Tabs tablet  Commonly known as:  ELIQUIS  Take 1 tablet (5 mg total) by mouth 2 (two) times daily.     aspirin 81 MG chewable tablet  Chew 1 tablet (81 mg total) by mouth daily.     calcium-vitamin D 500-200 MG-UNIT per tablet  Commonly known as:  OSCAL WITH D  Take 1 tablet by mouth daily.     furosemide 40 MG tablet  Commonly known as:  LASIX  Take 1 tablet (40 mg total) by mouth 2 (two) times daily.     glipiZIDE-metformin 5-500 MG per tablet  Commonly known as:  METAGLIP  Take 2 tablets by mouth  2 (two) times daily.     lisinopril 10 MG tablet  Commonly known as:  PRINIVIL,ZESTRIL  Take 1 tablet (10 mg total) by mouth daily.     metoprolol succinate 50 MG 24 hr tablet  Commonly known as:  TOPROL-XL  Take 1 tablet (50 mg total) by mouth 2 (two) times daily. Take with or immediately following a meal.     multivitamin with minerals Tabs tablet  Take 1 tablet by mouth daily.     potassium chloride SA 20 MEQ tablet  Commonly known as:  K-DUR,KLOR-CON  Take 1 tablet (20 mEq total) by mouth daily.     pravastatin 40 MG tablet  Commonly known as:  PRAVACHOL  Take 40 mg by mouth at bedtime.           Follow-up Information   Follow up with Lars MassonNELSON, KATARINA, H, MD On 08/31/2013.   Specialty:  Cardiology   Contact information:   [address] [place] [id number] [phone]        The results of significant diagnostics from this hospitalization (including imaging, microbiology, ancillary and laboratory) are listed below for reference.    Significant Diagnostic Studies: Dg Chest 2 View  08/22/2013   CLINICAL DATA:  Followup of pleural effusion.  CAD.  Diabetes.  EXAM: CHEST  2 VIEW  COMPARISON:  DG CHEST 1V PORT dated 08/19/2013; DG CHEST 1V PORT dated 08/18/2013; CT ANGIO CHEST W/CM &/OR WO/CM dated 08/17/2013  FINDINGS: Extubation and removal of nasogastric tube. Lateral view degraded by patient arm position. Prior median sternotomy. Midline trachea. moderate cardiomegaly with aortic atherosclerosis. Decreased trace bilateral pleural effusions. No pneumothorax. Interval resolution of interstitial edema. Improved bibasilar aeration with resolved atelectasis.  IMPRESSION: Cardiomegaly with resolution of congestive heart failure and bibasilar atelectasis.  Trace bilateral pleural effusions remain.  Extubation and removal of nasogastric tube.   Electronically Signed   By: Jeronimo GreavesKyle  Talbot M.D.   On: 08/22/2013 21:15   Ct Angio Chest Pe W/cm &/or Wo Cm  08/17/2013    CLINICAL DATA:  Ventilator dependent respiratory failure. Severe CHF noted on portable chest examination earlier tonight.  EXAM: CT ANGIOGRAPHY CHEST WITH CONTRAST  TECHNIQUE: Multidetector CT imaging of the chest was performed using the standard protocol during bolus administration of intravenous contrast. Multiplanar CT image reconstructions and MIPs were obtained to evaluate the vascular anatomy.  CONTRAST:  100mL OMNIPAQUE IOHEXOL 350 MG/ML IV.  COMPARISON:  No prior CT.  Portable chest x-ray earlier same date.  FINDINGS: Endotracheal tube tip is just above the carina with the patient completely supine and should be withdrawn 3-4 cm.  Contrast opacification of the pulmonary arteries is good. No filling defects within either main pulmonary  artery or their branches in either lung to suggest pulmonary embolism. Heart enlarged with left ventricular enlargement and borderline left ventricular hypertrophy. Moderate to severe 3 vessel coronary atherosclerosis. Prior CABG. Calcification involving the more superior of the left coronary graft. The middle of the 3 grafts is either very small or potentially occluded (though I cannot state this with certainty as the contrast opacification of the ascending thoracic aorta is not optimized). Moderate to severe atherosclerosis involving the thoracic and upper abdominal aorta without aneurysm or dissection.  Interstitial and airspace pulmonary edema in both lungs. Large bilateral pleural effusions, right greater than left. Dense consolidation with air bronchograms in the lower lobes and in the upper lobes. Central airways patent.  Numerous normal sized lymph nodes throughout the mediastinum, and in both hila, and in the axillae. No nodal masses. Visualized thyroid gland unremarkable.  Visualized upper abdomen unremarkable for the early arterial phase of enhancement. Bone window images demonstrate mild thoracic spondylosis.  Review of the MIP images confirms the above findings.   IMPRESSION: 1. No evidence of pulmonary embolism. 2. The middle of the 3 coronary bypass grafts is either diminutive or occluded. The other 2 grafts appear patent. Please note that the ascending thoracic aorta was not optimally opacified. 3. Interstitial and airspace pulmonary edema. 4. Dense consolidation with air bronchograms in the lower lobes and in the upper lobes bilaterally, consistent with either dense passive atelectasis related to the large bilateral pleural effusions or potentially pneumonia. 5. Endotracheal tube tip just above the carina with the patient supine. It should be withdrawn approximately 3-4 cm. These results were called by telephone at the time of interpretation on 08/17/2013 at 10:38 PM to Dr. Jaci Carrel , who verbally acknowledged these results.   Electronically Signed   By: Hulan Saas M.D.   On: 08/17/2013 22:41   Dg Chest Port 1 View  08/29/2013   CLINICAL DATA:  Shortness of breath  EXAM: PORTABLE CHEST - 1 VIEW  COMPARISON:  08/22/2013  FINDINGS: Fall cardiac enlargement. Central vascular congestion. Interstitial prominence. Cannot exclude a retrocardiac process. Small pleural effusions. Median sternotomy. Limited osseous evaluation due to technique/patient body habitus.  IMPRESSION: Cardiac enlargement and central vascular congestion. Small pleural effusions. Interstitial prominence may reflect interstitial edema.   Electronically Signed   By: Jearld Lesch M.D.   On: 08/29/2013 00:56   Dg Chest Port 1 View  08/19/2013   CLINICAL DATA:  Evaluate endotracheal tube.  EXAM: PORTABLE CHEST - 1 VIEW  COMPARISON:  08/18/2013  FINDINGS: Endotracheal tube is 3.4 cm above the carina. Nasogastric tube is coiled in the stomach. Prominent interstitial densities bilaterally suggest edema and minimally changed. Densities at both lung bases are suggestive for pleural fluid and atelectasis, left side greater than right. Heart size appears to be upper limits of normal. No  evidence for a pneumothorax.  IMPRESSION: Increased basilar densities, particularly at the left lung base. Findings may represent a combination of atelectasis and pleural fluid.  Prominent interstitial markings suggest mild edema.  Support apparatuses as described.   Electronically Signed   By: Richarda Overlie M.D.   On: 08/19/2013 08:36   Dg Chest Port 1 View  08/18/2013   CLINICAL DATA:  Respiratory failure  EXAM: PORTABLE CHEST - 1 VIEW  COMPARISON:  08/17/2013  FINDINGS: Endotracheal tube in good position.  NG tube enters the stomach.  Improvement in diffuse bilateral edema. Mild edema remains. Mild bibasilar atelectasis. Cardiac enlargement.  IMPRESSION: Improving bilateral pulmonary edema.  Electronically Signed   By: Marlan Palau M.D.   On: 08/18/2013 07:35   Dg Chest Port 1 View  08/17/2013   CLINICAL DATA:  Respiratory respiratory distress. ET tube placement.  EXAM: PORTABLE CHEST - 1 VIEW  COMPARISON:  None.  FINDINGS: NG tube is in place and courses into the stomach and below the inferior margin of the film. Endotracheal tube is also in place with the tip approximately 2.6 cm above the carina. There is marked cardiomegaly and extensive bilateral pulmonary edema. No pneumothorax or pleural effusion.  IMPRESSION: ETT tip is approximately 2.6 cm above the carina.  Cardiomegaly and extensive bilateral pulmonary edema.   Electronically Signed   By: Drusilla Kanner M.D.   On: 08/17/2013 20:14    Microbiology: Recent Results (from the past 240 hour(s))  URINE CULTURE     Status: None   Collection Time    08/29/13  2:30 AM      Result Value Range Status   Specimen Description URINE, CLEAN CATCH   Final   Special Requests NONE   Final   Culture  Setup Time     Final   Value: 08/29/2013 03:00     Performed at [facility] Count     Final   Value: 75,000 COLONIES/ML     Performed at [facility]   Culture     Final   Value: Multiple bacterial morphotypes present, none  predominant. Suggest appropriate recollection if clinically indicated.     Performed at [facility]   Report Status 08/30/2013 FINAL   Final     Labs: Basic Metabolic Panel:  Recent Labs Lab 08/28/13 0025 08/29/13 0456 08/30/13 0635  NA 139 141 141  K 3.5* 3.5* 3.5*  CL 95* 96 98  CO2 28 30 31   GLUCOSE 127* 121* 134*  BUN 30* 27* 16  CREATININE 1.14 1.12 0.79  CALCIUM 9.4 9.1 9.2   Liver Function Tests:  Recent Labs Lab 08/29/13 0456  AST 22  ALT 25  ALKPHOS 83  BILITOT 1.2  PROT 7.1  ALBUMIN 3.8   No results found for this basename: LIPASE, AMYLASE,  in the last 168 hours No results found for this basename: AMMONIA,  in the last 168 hours CBC:  Recent Labs Lab 08/28/13 0025 08/29/13 0456  WBC 15.3* 12.2*  NEUTROABS 11.0*  --   HGB 13.3 12.6*  HCT 39.2 37.2*  MCV 88.3 88.8  PLT 349 334   Cardiac Enzymes:  Recent Labs Lab 08/28/13 0025  TROPONINI <0.30   BNP: BNP (last 3 results)  Recent Labs  08/17/13 2050 08/28/13 0025  PROBNP 5197.0* 1476.0*   CBG:  Recent Labs Lab 08/29/13 0741 08/29/13 1144 08/29/13 1704 08/29/13 2200 08/30/13 0805  GLUCAP 83 113* 115* 96 139*    Time coordinating discharge: Over 30 minutes

## 2013-08-30 NOTE — Progress Notes (Signed)
Patient ID: Randy Nunez, male   DOB: Sep 26, 1940, 73 y.o.   MRN: [id number]      Subjective:    Reports breathing improved, ambulated without significant symptoms yesterday  Objective:   Temp:  [97.9 F (36.6 C)-98.3 F (36.8 C)] 97.9 F (36.6 C) (01/28 0535) Pulse Rate:  [60-85] 85 (01/28 0535) Resp:  [18-20] 20 (01/28 0535) BP: (118-150)/(67-80) 150/80 mmHg (01/28 0535) SpO2:  [92 %-100 %] 100 % (01/28 0535) Weight:  [205 lb 4 oz (93.1 kg)] 205 lb 4 oz (93.1 kg) (01/28 0620) Last BM Date: 08/28/13  Filed Weights   08/29/13 0209 08/29/13 0700 08/30/13 3149  Weight: 211 lb 3.2 oz (95.8 kg) 206 lb (93.441 kg) 205 lb 4 oz (93.1 kg)    Intake/Output Summary (Last 24 hours) at 08/30/13 0805 Last data filed at 08/30/13 0738  Gross per 24 hour  Intake 480.67 ml  Output   4250 ml  Net -3769.33 ml    Telemetry:afib regular rates, NSVT  Exam:  General:NAD  Resp:CTAB  Cardiac: irreg, no m/r/g, no JVD  GI: abdomen soft, NT, ND  MSK: extremities are warm, no edema  Neuro: no focal deficits   Lab Results:  Basic Metabolic Panel:  Recent Labs Lab 08/28/13 0025 08/29/13 0456 08/30/13 0635  NA 139 141 141  K 3.5* 3.5* 3.5*  CL 95* 96 98  CO2 28 30 31   GLUCOSE 127* 121* 134*  BUN 30* 27* 16  CREATININE 1.14 1.12 0.79  CALCIUM 9.4 9.1 9.2    Liver Function Tests:  Recent Labs Lab 08/29/13 0456  AST 22  ALT 25  ALKPHOS 83  BILITOT 1.2  PROT 7.1  ALBUMIN 3.8    CBC:  Recent Labs Lab 08/28/13 0025 08/29/13 0456  WBC 15.3* 12.2*  HGB 13.3 12.6*  HCT 39.2 37.2*  MCV 88.3 88.8  PLT 349 334    Cardiac Enzymes:  Recent Labs Lab 08/28/13 0025  TROPONINI <0.30    BNP:  Recent Labs  08/17/13 2050 08/28/13 0025  PROBNP 5197.0* 1476.0*    Coagulation:  Recent Labs Lab 08/28/13 0025  INR 1.51*    ECG:   Medications:   Scheduled Medications: . apixaban  5 mg Oral BID  . aspirin  81 mg Oral Daily  . furosemide  20 mg  Intravenous Q12H  . insulin aspart  0-9 Units Subcutaneous TID WC  . lisinopril  10 mg Oral Daily  . metoprolol succinate  50 mg Oral BID WC  . potassium chloride SA  20 mEq Oral Daily  . simvastatin  20 mg Oral q1800  . sodium chloride  3 mL Intravenous Q12H     Infusions:     PRN Medications:  sodium chloride, ALPRAZolam, HYDROcodone-acetaminophen, ondansetron (ZOFRAN) IV, ondansetron, sodium chloride     Assessment/Plan    1. Acute on chronic combined systolic and diastolic heart failure - negative 3.2 liters yesterday, total negative 5.4 liters since admissions. Cr actually improving with diuresis consistent with venous congestion.  - from cardiac standpoint appropriate for discharge, resume home oral diuretics. He has follow up with Dr Delton See Jan 29 already scheduled.  2. Afib - rate controlled and on anticoagulation, potential DCCV had been prevoiusly planned as an outpatient - continue current meds  3. CAD - no current symptoms, continue current medications.       Dina Rich, M.D., F.A.C.C.

## 2013-08-30 NOTE — Progress Notes (Signed)
Pt discharged home today per MD. Pt's IV site D/C'd and WNL. Pt's VS stable at this time. Pt provided with home medication list and discharge instructions. Verbalized understanding. Pt made aware of follow up appointments place post discharge. Verbalized understanding. Pt ambulated off floor in stable condition accompanied by RN.

## 2013-08-30 NOTE — Discharge Instructions (Signed)
Heart Failure °Heart failure is a condition in which the heart has trouble pumping blood. This means your heart does not pump blood efficiently for your body to work well. In some cases of heart failure, fluid may back up into your lungs or you may have swelling (edema) in your lower legs. Heart failure is usually a long-term (chronic) condition. It is important for you to take good care of yourself and follow your caregiver's treatment plan. °CAUSES  °Some health conditions can cause heart failure. Those health conditions include: °· High blood pressure (hypertension) causes the heart muscle to work harder than normal. When pressure in the blood vessels is high, the heart needs to pump (contract) with more force in order to circulate blood throughout the body. High blood pressure eventually causes the heart to become stiff and weak. °· Coronary artery disease (CAD) is the buildup of cholesterol and fat (plaque) in the arteries of the heart. The blockage in the arteries deprives the heart muscle of oxygen and blood. This can cause chest pain and may lead to a heart attack. High blood pressure can also contribute to CAD. °· Heart attack (myocardial infarction) occurs when 1 or more arteries in the heart become blocked. The loss of oxygen damages the muscle tissue of the heart. When this happens, part of the heart muscle dies. The injured tissue does not contract as well and weakens the heart's ability to pump blood. °· Abnormal heart valves can cause heart failure when the heart valves do not open and close properly. This makes the heart muscle pump harder to keep the blood flowing. °· Heart muscle disease (cardiomyopathy or myocarditis) is damage to the heart muscle from a variety of causes. These can include drug or alcohol abuse, infections, or unknown reasons. These can increase the risk of heart failure. °· Lung disease makes the heart work harder because the lungs do not work properly. This can cause a strain  on the heart, leading it to fail. °· Diabetes increases the risk of heart failure. High blood sugar contributes to high fat (lipid) levels in the blood. Diabetes can also cause slow damage to tiny blood vessels that carry important nutrients to the heart muscle. When the heart does not get enough oxygen and food, it can cause the heart to become weak and stiff. This leads to a heart that does not contract efficiently. °· Other conditions can contribute to heart failure. These include abnormal heart rhythms, thyroid problems, and low blood counts (anemia). °Certain unhealthy behaviors can increase the risk of heart failure. Those unhealthy behaviors include: °· Being overweight. °· Smoking or chewing tobacco. °· Eating foods high in fat and cholesterol. °· Abusing illicit drugs or alcohol. °· Lacking physical activity. °SYMPTOMS  °Heart failure symptoms may vary and can be hard to detect. Symptoms may include: °· Shortness of breath with activity, such as climbing stairs. °· Persistent cough. °· Swelling of the feet, ankles, legs, or abdomen. °· Unexplained weight gain. °· Difficulty breathing when lying flat (orthopnea). °· Waking from sleep because of the need to sit up and get more air. °· Rapid heartbeat. °· Fatigue and loss of energy. °· Feeling lightheaded, dizzy, or close to fainting. °· Loss of appetite. °· Nausea. °· Increased urination during the night (nocturia). °DIAGNOSIS  °A diagnosis of heart failure is based on your history, symptoms, physical examination, and diagnostic tests. °Diagnostic tests for heart failure may include: °· Echocardiography. °· Electrocardiography. °· Chest X-ray. °· Blood tests. °· Exercise   stress test. °· Cardiac angiography. °· Radionuclide scans. °TREATMENT  °Treatment is aimed at managing the symptoms of heart failure. Medicines, behavioral changes, or surgical intervention may be necessary to treat heart failure. °· Medicines to help treat heart failure may  include: °· Angiotensin-converting enzyme (ACE) inhibitors. This type of medicine blocks the effects of a blood protein called angiotensin-converting enzyme. ACE inhibitors relax (dilate) the blood vessels and help lower blood pressure. °· Angiotensin receptor blockers. This type of medicine blocks the actions of a blood protein called angiotensin. Angiotensin receptor blockers dilate the blood vessels and help lower blood pressure. °· Water pills (diuretics). Diuretics cause the kidneys to remove salt and water from the blood. The extra fluid is removed through urination. This loss of extra fluid lowers the volume of blood the heart pumps. °· Beta blockers. These prevent the heart from beating too fast and improve heart muscle strength. °· Digitalis. This increases the force of the heartbeat. °· Healthy behavior changes include: °· Obtaining and maintaining a healthy weight. °· Stopping smoking or chewing tobacco. °· Eating heart healthy foods. °· Limiting or avoiding alcohol. °· Stopping illicit drug use. °· Physical activity as directed by your caregiver. °· Surgical treatment for heart failure may include: °· A procedure to open blocked arteries, repair damaged heart valves, or remove damaged heart muscle tissue. °· A pacemaker to improve heart muscle function and control certain abnormal heart rhythms. °· An internal cardioverter defibrillator to treat certain serious abnormal heart rhythms. °· A left ventricular assist device to assist the pumping ability of the heart. °HOME CARE INSTRUCTIONS  °· Take your medicine as directed by your caregiver. Medicines are important in reducing the workload of your heart, slowing the progression of heart failure, and improving your symptoms. °· Do not stop taking your medicine unless directed by your caregiver. °· Do not skip any dose of medicine. °· Refill your prescriptions before you run out of medicine. Your medicines are needed every day. °· Take over-the-counter  medicine only as directed by your caregiver or pharmacist. °· Engage in moderate physical activity if directed by your caregiver. Moderate physical activity can benefit some people. The elderly and people with severe heart failure should consult with a caregiver for physical activity recommendations. °· Eat heart healthy foods. Food choices should be free of trans fat and low in saturated fat, cholesterol, and salt (sodium). Healthy choices include fresh or frozen fruits and vegetables, fish, lean meats, legumes, fat-free or low-fat dairy products, and whole grain or high fiber foods. Talk to a dietitian to learn more about heart healthy foods. °· Limit sodium if directed by your caregiver. Sodium restriction may reduce symptoms of heart failure in some people. Talk to a dietitian to learn more about heart healthy seasonings. °· Use healthy cooking methods. Healthy cooking methods include roasting, grilling, broiling, baking, poaching, steaming, or stir-frying. Talk to a dietitian to learn more about healthy cooking methods. °· Limit fluids if directed by your caregiver. Fluid restriction may reduce symptoms of heart failure in some people. °· Weigh yourself every day. Daily weights are important in the early recognition of excess fluid. You should weigh yourself every morning after you urinate and before you eat breakfast. Wear the same amount of clothing each time you weigh yourself. Record your daily weight. Provide your caregiver with your weight record. °· Monitor and record your blood pressure if directed by your caregiver. °· Check your pulse if directed by your caregiver. °· Lose weight if directed   by your caregiver. Weight loss may reduce symptoms of heart failure in some people. °· Stop smoking or chewing tobacco. Nicotine makes your heart work harder by causing your blood vessels to constrict. Do not use nicotine gum or patches before talking to your caregiver. °· Schedule and attend follow-up visits as  directed by your caregiver. It is important to keep all your appointments. °· Limit alcohol intake to no more than 1 drink per day for nonpregnant women and 2 drinks per day for men. Drinking more than that is harmful to your heart. Tell your caregiver if you drink alcohol several times a week. Talk with your caregiver about whether alcohol is safe for you. If your heart has already been damaged by alcohol or you have severe heart failure, drinking alcohol should be stopped completely. °· Stop illicit drug use. °· Stay up-to-date with immunizations. It is especially important to prevent respiratory infections through current pneumococcal and influenza immunizations. °· Manage other health conditions such as hypertension, diabetes, thyroid disease, or abnormal heart rhythms as directed by your caregiver. °· Learn to manage stress. °· Plan rest periods when fatigued. °· Learn strategies to manage high temperatures. If the weather is extremely hot: °· Avoid vigorous physical activity. °· Use air conditioning or fans or seek a cooler location. °· Avoid caffeine and alcohol. °· Wear loose-fitting, lightweight, and light-colored clothing. °· Learn strategies to manage cold temperatures. If the weather is extremely cold: °· Avoid vigorous physical activity. °· Layer clothes. °· Wear mittens or gloves, a hat, and a scarf when going outside. °· Avoid alcohol. °· Obtain ongoing education and support as needed. °· Participate or seek rehabilitation as needed to maintain or improve independence and quality of life. °SEEK MEDICAL CARE IF:  °· Your weight increases by 03 lb/1.4 kg in 1 day or 05 lb/2.3 kg in a week. °· You have increasing shortness of breath that is unusual for you. °· You are unable to participate in your usual physical activities. °· You tire easily. °· You cough more than normal, especially with physical activity. °· You have any or more swelling in areas such as your hands, feet, ankles, or abdomen. °· You  are unable to sleep because it is hard to breathe. °· You feel like your heart is beating fast (palpitations). °· You become dizzy or lightheaded upon standing up. °SEEK IMMEDIATE MEDICAL CARE IF:  °· You have difficulty breathing. °· There is a change in mental status such as decreased alertness or difficulty with concentration. °· You have a pain or discomfort in your chest. °· You have an episode of fainting (syncope). °MAKE SURE YOU:  °· Understand these instructions. °· Will watch your condition. °· Will get help right away if you are not doing well or get worse. °Document Released: 07/20/2005 Document Revised: 11/14/2012 Document Reviewed: 08/11/2012 °[employer]® Patient Information ©2014 [employer]. ° °

## 2013-08-31 ENCOUNTER — Encounter: Payer: Medicare Other | Admitting: Cardiology

## 2013-09-14 ENCOUNTER — Encounter: Payer: Self-pay | Admitting: Cardiology

## 2013-09-14 ENCOUNTER — Ambulatory Visit (INDEPENDENT_AMBULATORY_CARE_PROVIDER_SITE_OTHER): Payer: Medicare Other | Admitting: Cardiology

## 2013-09-14 ENCOUNTER — Ambulatory Visit (INDEPENDENT_AMBULATORY_CARE_PROVIDER_SITE_OTHER): Payer: Medicare Other | Admitting: *Deleted

## 2013-09-14 VITALS — BP 142/70 | HR 76 | Ht 71.0 in | Wt 212.0 lb

## 2013-09-14 DIAGNOSIS — I509 Heart failure, unspecified: Secondary | ICD-10-CM

## 2013-09-14 DIAGNOSIS — R0602 Shortness of breath: Secondary | ICD-10-CM

## 2013-09-14 LAB — COMPREHENSIVE METABOLIC PANEL
ALT: 23 U/L (ref 0–53)
AST: 23 U/L (ref 0–37)
Albumin: 3.9 g/dL (ref 3.5–5.2)
Alkaline Phosphatase: 60 U/L (ref 39–117)
BUN: 16 mg/dL (ref 6–23)
CO2: 29 mEq/L (ref 19–32)
Calcium: 8.9 mg/dL (ref 8.4–10.5)
Chloride: 102 mEq/L (ref 96–112)
Creatinine, Ser: 0.7 mg/dL (ref 0.4–1.5)
GFR: 120.91 mL/min (ref >60.00)
Glucose, Bld: 125 mg/dL — ABNORMAL HIGH (ref 70–99)
Potassium: 3.6 mEq/L (ref 3.5–5.1)
Sodium: 140 mEq/L (ref 135–145)
Total Bilirubin: 1.7 mg/dL — ABNORMAL HIGH (ref 0.3–1.2)
Total Protein: 6.9 g/dL (ref 6.0–8.3)

## 2013-09-14 LAB — BRAIN NATRIURETIC PEPTIDE: Pro B Natriuretic peptide (BNP): 224 pg/mL — ABNORMAL HIGH (ref 0.0–100.0)

## 2013-09-14 MED ORDER — FUROSEMIDE 40 MG PO TABS: 40.0000 mg | ORAL_TABLET | Freq: Two times a day (BID) | ORAL | Status: DC

## 2013-09-14 MED ORDER — APIXABAN 5 MG PO TABS: 5.0000 mg | ORAL_TABLET | Freq: Two times a day (BID) | ORAL | Status: DC

## 2013-09-14 NOTE — Progress Notes (Signed)
Pt was started on Eliquis for Afib on 08/2013 in hospital.    Reviewed patients medication list.  Pt is not currently on any combined P-gp and strong CYP3A4 inhibitors/inducers (ketoconazole, traconazole, ritonavir, carbamazepine, phenytoin, rifampin, St. John's wort).  Reviewed labs.  SCr 0.7 , Weight 96Kg.   Dose appropriate  based on age, weight and Scr.    A full discussion of the nature of anticoagulants has been carried out.  A benefit/risk analysis has been presented to the patient, so that they understand the justification for choosing anticoagulation with Eliquis at this time.  The need for compliance is stressed.  Pt is aware to take the medication twice daily.  Side effects of potential bleeding are discussed, including unusual colored urine or stools, coughing up blood or coffee ground emesis, nose bleeds or serious fall or head trauma.  Discussed signs and symptoms of stroke. The patient should avoid any OTC items containing aspirin or ibuprofen.  Avoid alcohol consumption.   Call if any signs of abnormal bleeding.  Discussed financial obligations and resolved any difficulty in obtaining medication.  Next lab test test in 6 months.

## 2013-09-14 NOTE — Patient Instructions (Signed)
Your physician recommends that you continue on your current medications as directed. Please refer to the Current Medication list given to you today.  Your physician recommends that you return for lab work in: today  Your physician recommends that you schedule a follow-up appointment in: 2 weeks  

## 2013-09-14 NOTE — Patient Instructions (Signed)
  A full discussion of the nature of anticoagulants has been carried out.  A benefit/risk analysis has been presented to the patient, so that they understand the justification for choosing anticoagulation with Eliquis at this time.  The need for compliance is stressed.  Pt is aware to take the medication twice daily.  Side effects of potential bleeding are discussed, including unusual colored urine or stools, coughing up blood or coffee ground emesis, nose bleeds or serious fall or head trauma.  Discussed signs and symptoms of stroke. The patient should avoid any OTC items containing aspirin or ibuprofen.  Avoid alcohol consumption.   Call if any signs of abnormal bleeding.  Discussed financial obligations and resolved any difficulty in obtaining medication.  Next lab test test in 6 month. [id number].

## 2013-09-14 NOTE — Progress Notes (Signed)
Patient ID: NIR DELAGE, male   DOB: [birth year], 73 y.o.   MRN: [id number]     Patient Name: Randy Nunez Date of Encounter: 09/14/2013  Primary Care Provider:  No PCP Per Patient Primary Cardiologist:  Tobias Alexander, H   Problem List   Past Medical History  Diagnosis Date  . Diabetes mellitus without complication   . Hypertension   . CAD (coronary artery disease)     a. s/p CABG 2001. b. NSTEMI 08/2013 - cath done, med rx recommended.  . Atrial fibrillation     a. Dx 08/2013. Placed on eliquis. Plan DCCV as outpt. Also has had WCT c/w aberrancy.  . Chronic combined systolic and diastolic CHF (congestive heart failure)   . Acute respiratory failure     a. 08/2013 - VDRF due to pulm edema.   Past Surgical History  Procedure Laterality Date  . Left heart cath  2015    Dr Allyson Sabal, with stenting  . Coronary artery bypass graft  2001    Allergies  Allergies  Allergen Reactions  . Penicillins     HPI  73 y/o male with PMH of HTN, HPL, DM, CAD, s/p CABG in 2001, chronic systolic CHF admitted with CHF exacerbation secondary to meds non-compliance on 08/30/2013.  He had cardiac cath on 08/21/2013 Which demonstrated patent grafts with the exception of total occlusion of SVG to diag. He was to be treated medically per cardiology. Atrial fib was new diagnosis. He was started on apixaban with plans for DCCV after one month of anticoagulation. Echo at that time demonstrated EF of 40-45%. There was a question of tachycardic mediated CM vs ICM. He was continued on ACE, BB and statin. He was diuresed 13 lbs (wt down to 202 lbs) during hospitalization and discharged on 08/23/2013. Actos was discontinued.  After discharge he didn't take his Lasix and was admitted again on 08/30/2013, diuresed 12 pounds again and discharged.  Today he states that the last week he went to [facility] again for SOB, they diuresed him overnight and sent home. He is asymptomatic today and states that he is complaint  to his meds.  Home Medications  Prior to Admission medications   Medication Sig Start Date End Date Taking? Authorizing Provider  ALPRAZolam Prudy Feeler) 0.5 MG tablet Take 0.5 mg by mouth daily as needed for anxiety.   Yes Historical Provider, MD  apixaban (ELIQUIS) 5 MG TABS tablet Take 1 tablet (5 mg total) by mouth 2 (two) times daily. 09/14/13  Yes Lars Masson, MD  aspirin 81 MG chewable tablet Chew 1 tablet (81 mg total) by mouth daily. 08/23/13  Yes Lonia Blood, MD  calcium-vitamin D (OSCAL WITH D) 500-200 MG-UNIT per tablet Take 1 tablet by mouth daily.   Yes Historical Provider, MD  furosemide (LASIX) 40 MG tablet Take 1 tablet (40 mg total) by mouth 2 (two) times daily. 09/14/13  Yes Lars Masson, MD  glipiZIDE-metformin (METAGLIP) 5-500 MG per tablet Take 2 tablets by mouth 2 (two) times daily.   Yes Historical Provider, MD  lisinopril (PRINIVIL,ZESTRIL) 10 MG tablet Take 1 tablet (10 mg total) by mouth daily. 08/23/13  Yes Lonia Blood, MD  metoprolol succinate (TOPROL-XL) 50 MG 24 hr tablet Take 1 tablet (50 mg total) by mouth 2 (two) times daily. Take with or immediately following a meal. 08/23/13  Yes Lonia Blood, MD  Multiple Vitamin (MULTIVITAMIN WITH MINERALS) TABS tablet Take 1 tablet by mouth daily.  Yes Historical Provider, MD  potassium chloride SA (K-DUR,KLOR-CON) 20 MEQ tablet Take 1 tablet (20 mEq total) by mouth daily. 08/24/13  Yes Lonia Blood, MD  pravastatin (PRAVACHOL) 40 MG tablet Take 40 mg by mouth at bedtime.   Yes Historical Provider, MD    Family History  Family History  Problem Relation Age of Onset  . Hypertension    . Hyperlipidemia      Social History  History   Social History  . Marital Status: Single    Spouse Name: N/A    Number of Children: N/A  . Years of Education: N/A   Occupational History  . Not on file.   Social History Main Topics  . Smoking status: Never Smoker   . Smokeless tobacco: Not on file  .  Alcohol Use: No  . Drug Use: No  . Sexual Activity: Not on file   Other Topics Concern  . Not on file   Social History Narrative  . No narrative on file     Review of Systems, as per HPI, otherwise negative General:  No chills, fever, night sweats or weight changes.  Cardiovascular:  No chest pain, dyspnea on exertion, edema, orthopnea, palpitations, paroxysmal nocturnal dyspnea. Dermatological: No rash, lesions/masses Respiratory: No cough, dyspnea Urologic: No hematuria, dysuria Abdominal:   No nausea, vomiting, diarrhea, bright red blood per rectum, melena, or hematemesis Neurologic:  No visual changes, wkns, changes in mental status. All other systems reviewed and are otherwise negative except as noted above.  Physical Exam  Blood pressure 142/70, pulse 76, height 5\' 11"  (1.803 m), weight 212 lb (96.163 kg).  General: Pleasant, NAD Psych: Normal affect. Neuro: Alert and oriented X 3. Moves all extremities spontaneously. HEENT: Normal  Neck: Supple without bruits or JVD. Lungs:  Resp regular and unlabored, CTA. Heart: RRR no s3, s4, or murmurs. Abdomen: Soft, non-tender, non-distended, BS + x 4.  Extremities: No clubbing, cyanosis or edema. DP/PT/Radials 2+ and equal bilaterally.  Labs:  No results found for this basename: CKTOTAL, CKMB, TROPONINI,  in the last 72 hours Lab Results  Component Value Date   WBC 12.2* 08/29/2013   HGB 12.6* 08/29/2013   HCT 37.2* 08/29/2013   MCV 88.8 08/29/2013   PLT 334 08/29/2013   No results found for this basename: NA, K, CL, CO2, BUN, CREATININE, CALCIUM, LABALBU, PROT, BILITOT, ALKPHOS, ALT, AST, GLUCOSE,  in the last 168 hours Lab Results  Component Value Date   TRIG 76 08/18/2013   No results found for this basename: DDIMER   No components found with this basename: POCBNP,   Accessory Clinical Findings  echocardiogram  ECG - a-fib, 76 BPM, non-specific ST and T wave abnormalities   Assessment & Plan  73 y/o male with  PMH of HTN, HPL, DM, CAD, CHF admitted multiple times with  CHF exacerbation secondary to meds non-compliance, last time on 08/30/2013.  1. Acute on chronic combined systolic and diastolic heart failure - on discharge 205 lbs, today 212, howeevr appears euvolemic and is asymptomatic. We will continue the same regimen, check CMP, BNP today and follow in 3 weeks.   2. Afib - rate controlled and on anticoagulation with Eliquis, he states that he can't afford it and doesn't want to proceed with DCCV yet. We will follow him in 3 weeks, pharmacy will evaluate him and potentially switch to Xarelto as we can provide free samples. - continue current meds  3. CAD - no current symptoms, continue current medications.  Follow up in 3 weeks.   Lars MassonNELSON, Aragorn Recker, H, MD, [facility]ACC 09/14/2013, 9:55 AM

## 2013-09-20 ENCOUNTER — Telehealth: Payer: Self-pay | Admitting: *Deleted

## 2013-09-20 NOTE — Telephone Encounter (Signed)
[facility] approved eliquis medication through 09/20/2014, PA # [phone], patient notified, script has been sent to [facility].

## 2013-10-03 ENCOUNTER — Encounter: Payer: Self-pay | Admitting: Cardiovascular Disease

## 2013-10-05 ENCOUNTER — Encounter: Payer: Self-pay | Admitting: Cardiology

## 2013-10-05 ENCOUNTER — Ambulatory Visit (INDEPENDENT_AMBULATORY_CARE_PROVIDER_SITE_OTHER): Payer: Medicare Other | Admitting: Cardiology

## 2013-10-05 VITALS — BP 122/59 | HR 68 | Ht 71.0 in | Wt 208.0 lb

## 2013-10-05 DIAGNOSIS — I2510 Atherosclerotic heart disease of native coronary artery without angina pectoris: Secondary | ICD-10-CM

## 2013-10-05 NOTE — Patient Instructions (Signed)
Your physician recommends that you continue on your current medications as directed. Please refer to the Current Medication list given to you today.  Your physician recommends that you return for lab work in: on 01/01/14  Your physician wants you to follow-up in: 6 months. You will receive a reminder letter in the mail two months in advance. If you don't receive a letter, please call our office to schedule the follow-up appointment.

## 2013-10-05 NOTE — Progress Notes (Signed)
Patient ID: Randy Nunez, male   DOB: [birth year], 73 y.o.   MRN: [id number]     Patient Name: Randy Nunez Date of Encounter: 10/05/2013  Primary Care Provider:  No PCP Per Patient Primary Cardiologist:  Lars Masson   Problem List   Past Medical History  Diagnosis Date  . Diabetes mellitus without complication   . Hypertension   . CAD (coronary artery disease)     a. s/p CABG 2001. b. NSTEMI 08/2013 - cath done, med rx recommended.  . Atrial fibrillation     a. Dx 08/2013. Placed on eliquis. Plan DCCV as outpt. Also has had WCT c/w aberrancy.  . Chronic combined systolic and diastolic CHF (congestive heart failure)   . Acute respiratory failure     a. 08/2013 - VDRF due to pulm edema.   Past Surgical History  Procedure Laterality Date  . Left heart cath  2015    Dr Allyson Sabal, with stenting  . Coronary artery bypass graft  2001    Allergies  Allergies  Allergen Reactions  . Penicillins     HPI  73 y/o male with PMH of HTN, HPL, DM, CAD, s/p CABG in 2001, chronic systolic CHF admitted with CHF exacerbation secondary to meds non-compliance on 08/30/2013.  He had cardiac cath on 08/21/2013 Which demonstrated patent grafts with the exception of total occlusion of SVG to diag. Medical therapy was recommended. Atrial fib was new diagnosis. He was started on apixaban with plans for DCCV after one month of anticoagulation. Echo at that time demonstrated EF of 40-45%. There was a question of tachycardic mediated CM vs ICM. He was continued on ACE, BB and statin. He was diuresed 13 lbs (wt down to 202 lbs) during hospitalization and discharged on 08/23/2013. Actos was discontinued.  After discharge he didn't take his Lasix and was admitted again on 08/30/2013, diuresed 12 pounds again and discharged.  Today he states that the last week he went to [facility] again for SOB, they diuresed him overnight and sent home. He is asymptomatic today and states that he is complaint to his  meds.  He is coming after 3 weeks, feels well, no DOE, no LE edema, occassional PND, less often than before. No chest pain.  Home Medications  Prior to Admission medications   Medication Sig Start Date End Date Taking? Authorizing Provider  ALPRAZolam Prudy Feeler) 0.5 MG tablet Take 0.5 mg by mouth daily as needed for anxiety.   Yes Historical Provider, MD  apixaban (ELIQUIS) 5 MG TABS tablet Take 1 tablet (5 mg total) by mouth 2 (two) times daily. 09/14/13  Yes Lars Masson, MD  aspirin 81 MG chewable tablet Chew 1 tablet (81 mg total) by mouth daily. 08/23/13  Yes Lonia Blood, MD  calcium-vitamin D (OSCAL WITH D) 500-200 MG-UNIT per tablet Take 1 tablet by mouth daily.   Yes Historical Provider, MD  furosemide (LASIX) 40 MG tablet Take 1 tablet (40 mg total) by mouth 2 (two) times daily. 09/14/13  Yes Lars Masson, MD  glipiZIDE-metformin (METAGLIP) 5-500 MG per tablet Take 2 tablets by mouth 2 (two) times daily.   Yes Historical Provider, MD  lisinopril (PRINIVIL,ZESTRIL) 10 MG tablet Take 1 tablet (10 mg total) by mouth daily. 08/23/13  Yes Lonia Blood, MD  metoprolol succinate (TOPROL-XL) 50 MG 24 hr tablet Take 1 tablet (50 mg total) by mouth 2 (two) times daily. Take with or immediately following a meal. 08/23/13  Yes Tinnie Gens  Silvestre Gunner, MD  Multiple Vitamin (MULTIVITAMIN WITH MINERALS) TABS tablet Take 1 tablet by mouth daily.   Yes Historical Provider, MD  potassium chloride SA (K-DUR,KLOR-CON) 20 MEQ tablet Take 1 tablet (20 mEq total) by mouth daily. 08/24/13  Yes Lonia Blood, MD  pravastatin (PRAVACHOL) 40 MG tablet Take 40 mg by mouth at bedtime.   Yes Historical Provider, MD    Family History  Family History  Problem Relation Age of Onset  . Hypertension    . Hyperlipidemia      Social History  History   Social History  . Marital Status: Single    Spouse Name: N/A    Number of Children: N/A  . Years of Education: N/A   Occupational History  . Not  on file.   Social History Main Topics  . Smoking status: Never Smoker   . Smokeless tobacco: Not on file  . Alcohol Use: No  . Drug Use: No  . Sexual Activity: Not on file   Other Topics Concern  . Not on file   Social History Narrative  . No narrative on file     Review of Systems, as per HPI, otherwise negative General:  No chills, fever, night sweats or weight changes.  Cardiovascular:  No chest pain, dyspnea on exertion, edema, orthopnea, palpitations, paroxysmal nocturnal dyspnea. Dermatological: No rash, lesions/masses Respiratory: No cough, dyspnea Urologic: No hematuria, dysuria Abdominal:   No nausea, vomiting, diarrhea, bright red blood per rectum, melena, or hematemesis Neurologic:  No visual changes, wkns, changes in mental status. All other systems reviewed and are otherwise negative except as noted above.  Physical Exam  Blood pressure 122/59, pulse 68, height 5\' 11"  (1.803 m), weight 208 lb (94.348 kg).  General: Pleasant, NAD Psych: Normal affect. Neuro: Alert and oriented X 3. Moves all extremities spontaneously. HEENT: Normal  Neck: Supple without bruits or JVD. Lungs:  Resp regular and unlabored, CTA. Heart: RRR no s3, s4, or murmurs. Abdomen: Soft, non-tender, non-distended, BS + x 4.  Extremities: No clubbing, cyanosis or edema. DP/PT/Radials 2+ and equal bilaterally.  Labs:  No results found for this basename: CKTOTAL, CKMB, TROPONINI,  in the last 72 hours Lab Results  Component Value Date   WBC 12.2* 08/29/2013   HGB 12.6* 08/29/2013   HCT 37.2* 08/29/2013   MCV 88.8 08/29/2013   PLT 334 08/29/2013   No results found for this basename: NA, K, CL, CO2, BUN, CREATININE, CALCIUM, LABALBU, PROT, BILITOT, ALKPHOS, ALT, AST, GLUCOSE,  in the last 168 hours Lab Results  Component Value Date   TRIG 76 08/18/2013   No results found for this basename: DDIMER   No components found with this basename: POCBNP,   Accessory Clinical  Findings  echocardiogram  ECG - a-fib, 76 BPM, non-specific ST and T wave abnormalities   Assessment & Plan  73 y/o male with PMH of HTN, HPL, DM, CAD, CHF admitted multiple times with  CHF exacerbation secondary to meds non-compliance, last time on 08/30/2013.  1. Acute on chronic combined systolic and diastolic heart failure - on discharge 205 lbs, today 208, howeevr appears euvolemic and is asymptomatic. We will continue the same regimen, check CMP, BNP today and follow in 3 months.  Last labs normal, BNP 200 from 1400 in [facility] in January.  2. Afib - rate controlled and on anticoagulation with Eliquis, he was seen by pharmacy and was approved for financial support for Eliquis.  3. CAD - no current symptoms, continue  current medications.   Follow up in 6 months, BMP in 3 months.   Lars MassonNELSON, Yassir Enis H, MD, [facility] 10/05/2013, 8:14 AM

## 2014-01-01 ENCOUNTER — Other Ambulatory Visit (INDEPENDENT_AMBULATORY_CARE_PROVIDER_SITE_OTHER): Payer: Medicare Other

## 2014-01-01 DIAGNOSIS — I2510 Atherosclerotic heart disease of native coronary artery without angina pectoris: Secondary | ICD-10-CM

## 2014-01-01 LAB — BASIC METABOLIC PANEL
BUN: 22 mg/dL (ref 6–23)
CO2: 31 mEq/L (ref 19–32)
Calcium: 8.7 mg/dL (ref 8.4–10.5)
Chloride: 98 mEq/L (ref 96–112)
Creatinine, Ser: 0.9 mg/dL (ref 0.4–1.5)
GFR: 83.14 mL/min (ref >60.00)
Glucose, Bld: 161 mg/dL — ABNORMAL HIGH (ref 70–99)
Potassium: 3.5 mEq/L (ref 3.5–5.1)
Sodium: 139 mEq/L (ref 135–145)

## 2014-01-02 ENCOUNTER — Encounter: Payer: Self-pay | Admitting: *Deleted

## 2014-01-02 ENCOUNTER — Telehealth: Payer: Self-pay | Admitting: *Deleted

## 2014-01-02 NOTE — Telephone Encounter (Signed)
LVM on pts confirmed voicemail about recent lab work being normal per Dr Delton See.  Letter sent as well to further update

## 2014-01-02 NOTE — Telephone Encounter (Signed)
Message copied by Loa Socks on Tue Jan 02, 2014  9:31 AM ------      Message from: Lars Masson      Created: Mon Jan 01, 2014 11:40 PM       Normal labs, please let him know ------

## 2014-01-19 ENCOUNTER — Other Ambulatory Visit: Payer: Self-pay | Admitting: Cardiology

## 2014-01-23 ENCOUNTER — Other Ambulatory Visit: Payer: Self-pay | Admitting: Cardiology

## 2014-01-24 ENCOUNTER — Encounter: Payer: Self-pay | Admitting: Cardiology

## 2014-03-14 ENCOUNTER — Telehealth: Payer: Self-pay | Admitting: *Deleted

## 2014-03-14 ENCOUNTER — Ambulatory Visit (INDEPENDENT_AMBULATORY_CARE_PROVIDER_SITE_OTHER): Payer: Medicare Other | Admitting: *Deleted

## 2014-03-14 DIAGNOSIS — I482 Chronic atrial fibrillation, unspecified: Secondary | ICD-10-CM

## 2014-03-14 DIAGNOSIS — I4891 Unspecified atrial fibrillation: Secondary | ICD-10-CM

## 2014-03-14 DIAGNOSIS — Z5181 Encounter for therapeutic drug level monitoring: Secondary | ICD-10-CM

## 2014-03-14 LAB — BASIC METABOLIC PANEL
BUN: 17 mg/dL (ref 6–23)
CO2: 29 mEq/L (ref 19–32)
Calcium: 9 mg/dL (ref 8.4–10.5)
Chloride: 103 mEq/L (ref 96–112)
Creatinine, Ser: 0.9 mg/dL (ref 0.4–1.5)
GFR: 84.13 mL/min (ref >60.00)
Glucose, Bld: 138 mg/dL — ABNORMAL HIGH (ref 70–99)
Potassium: 4 mEq/L (ref 3.5–5.1)
Sodium: 142 mEq/L (ref 135–145)

## 2014-03-14 LAB — CBC
HCT: 40.1 % (ref 39.0–52.0)
Hemoglobin: 13.4 g/dL (ref 13.0–17.0)
MCHC: 33.4 g/dL (ref 30.0–36.0)
MCV: 90.7 fl (ref 78.0–100.0)
Platelets: 225 10*3/uL (ref 150.0–400.0)
RBC: 4.42 Mil/uL (ref 4.22–5.81)
RDW: 15 % (ref 11.5–15.5)
WBC: 10.8 10*3/uL — ABNORMAL HIGH (ref 4.0–10.5)

## 2014-03-14 NOTE — Telephone Encounter (Signed)
Message copied by Jeannine Kitten on Wed Mar 14, 2014  2:18 PM ------      Message from: Lars Masson      Created: Wed Mar 14, 2014  1:10 PM       Yes, I want to continue both unless there are any side effects (bleeding).            ----- Message -----         From: Jeannine Kitten, RN         Sent: 03/14/2014  11:29 AM           To: Lars Masson, MD            Dr Delton See      Pt seen in coumadin clinic today for 6 months recheck regarding Eliquis and he is still taking   ASA 81mg  daily.      Just wanted to be sure that he is to continue his ASA       Thanks       Lelon Perla RN       ------

## 2014-03-14 NOTE — Telephone Encounter (Signed)
Pt instructed that Dr Delton See wants him to continue with ASA 81mg  daily . Pt states he is not having any bleeding and he states understanding

## 2014-03-14 NOTE — Progress Notes (Signed)
Pt was started on Eliquis 5mg  bid  for  Atrial fib  on January 2015.    Reviewed patients medication list.  Pt is not  currently on any combined P-gp and strong CYP3A4 inhibitors/inducers (ketoconazole, traconazole, ritonavir, carbamazepine, phenytoin, rifampin, St. John's wort).  Reviewed labs.  SCr 0.9 , Weight 99kg.  Dose is appropriate  based on labs  Hgb 13.4 and HCT 40.1  A full discussion of the nature of anticoagulants has been carried out.  A benefit/risk analysis has been presented to the patient, so that they understand the justification for choosing anticoagulation with Eliquis at this time.  The need for compliance is stressed.  Pt is aware to take the medication twice daily.  Side effects of potential bleeding are discussed, including unusual colored urine or stools, coughing up blood or coffee ground emesis, nose bleeds or serious fall or head trauma.  Discussed signs and symptoms of stroke. The patient should avoid any OTC items containing aspirin or ibuprofen.  Avoid alcohol consumption.   Call if any signs of abnormal bleeding.  Discussed financial obligations states is managing obtaining Eliquis .  Next lab test test in 6 months.  Pt states has had no problems in taking Eliquis and has not missed any doses and has not had any sign or symptom of bleeding or stroke.Is able to obtain Eliquis through insurance. Has added to medication list Multiple Vitamin with Iron  Pt states he has been taking Aleve and continues to have ASA 81mg  daily on med list Pt scheduled for CBC and BMET today and instructed will call with results.and set up time for recheck in clinic for 6 months Pt reminded about taking Aleve when on Eliquis Spoke with pt on phone and instructed that his Sr Cr  0.9 Hgb 13.2 and Hct 40.1 and that according to these labs he is on the correct dose of Eliquis and made an appt for return check in 6 months and pt states understanding Also instructed pt that Dr Delton See wants him to continue on  his ASA 81mg  daily and he states understanding

## 2014-03-28 ENCOUNTER — Encounter (INDEPENDENT_AMBULATORY_CARE_PROVIDER_SITE_OTHER): Payer: Self-pay | Admitting: *Deleted

## 2014-04-04 ENCOUNTER — Ambulatory Visit (INDEPENDENT_AMBULATORY_CARE_PROVIDER_SITE_OTHER): Payer: Medicare Other | Admitting: Cardiology

## 2014-04-04 ENCOUNTER — Encounter: Payer: Self-pay | Admitting: Cardiology

## 2014-04-04 VITALS — BP 128/62 | HR 58 | Ht 71.0 in | Wt 216.0 lb

## 2014-04-04 DIAGNOSIS — E785 Hyperlipidemia, unspecified: Secondary | ICD-10-CM

## 2014-04-04 DIAGNOSIS — I509 Heart failure, unspecified: Secondary | ICD-10-CM

## 2014-04-04 DIAGNOSIS — I10 Essential (primary) hypertension: Secondary | ICD-10-CM

## 2014-04-04 DIAGNOSIS — I5043 Acute on chronic combined systolic (congestive) and diastolic (congestive) heart failure: Secondary | ICD-10-CM

## 2014-04-04 MED ORDER — FUROSEMIDE 40 MG PO TABS: ORAL_TABLET | ORAL | Status: DC

## 2014-04-04 NOTE — Addendum Note (Signed)
Addended by: Loa Socks on: 04/04/2014 09:19 AM   Modules accepted: Orders

## 2014-04-04 NOTE — Progress Notes (Signed)
Patient ID: DAILAN RENTFRO, male   DOB: 04-Mar-1941, 73 y.o.   MRN: [id number]    Patient Name: Randy Nunez Date of Encounter: 04/04/2014  Primary Care Provider:  No PCP Per Patient Primary Cardiologist:  Lars Masson  Problem List   Past Medical History  Diagnosis Date  . Diabetes mellitus without complication   . Hypertension   . CAD (coronary artery disease)     a. s/p CABG 2001. b. NSTEMI 08/2013 - cath done, med rx recommended.  . Atrial fibrillation     a. Dx 08/2013. Placed on eliquis. Plan DCCV as outpt. Also has had WCT c/w aberrancy.  . Chronic combined systolic and diastolic CHF (congestive heart failure)   . Acute respiratory failure     a. 08/2013 - VDRF due to pulm edema.   Past Surgical History  Procedure Laterality Date  . Left heart cath  2015    Dr Allyson Sabal, with stenting  . Coronary artery bypass graft  2001   Allergies  Allergies  Allergen Reactions  . Penicillins    HPI  73 y/o male with PMH of HTN, HPL, DM, CAD, s/p CABG in 2001, chronic systolic CHF admitted with CHF exacerbation secondary to meds non-compliance on 08/30/2013.  He had cardiac cath on 08/21/2013 Which demonstrated patent grafts with the exception of total occlusion of SVG to diag. Medical therapy was recommended. Atrial fib was new diagnosis. He was started on apixaban with plans for DCCV after one month of anticoagulation. Echo at that time demonstrated EF of 40-45%. There was a question of tachycardic mediated CM vs ICM. He was continued on ACE, BB and statin. He was diuresed 13 lbs (wt down to 202 lbs) during hospitalization and discharged on 08/23/2013. Actos was discontinued.  After discharge he didn't take his Lasix and was admitted again on 08/30/2013, diuresed 12 pounds again and discharged.  Today he states that the last week he went to [facility] again for SOB, they diuresed him overnight and sent home. He is asymptomatic today and states that he is complaint to his meds.  04/04/2014  - He is coming after 3 months, feels well, no DOE, mild stable LE edema, no orthopnea or PND. No chest pain. He works as a Cabin crew. His legs swell up after prolonged sitting.  Home Medications  Prior to Admission medications   Medication Sig Start Date End Date Taking? Authorizing Provider  ALPRAZolam Prudy Feeler) 0.5 MG tablet Take 0.5 mg by mouth daily as needed for anxiety.   Yes Historical Provider, MD  apixaban (ELIQUIS) 5 MG TABS tablet Take 1 tablet (5 mg total) by mouth 2 (two) times daily. 09/14/13  Yes Lars Masson, MD  aspirin 81 MG chewable tablet Chew 1 tablet (81 mg total) by mouth daily. 08/23/13  Yes Lonia Blood, MD  calcium-vitamin D (OSCAL WITH D) 500-200 MG-UNIT per tablet Take 1 tablet by mouth daily.   Yes Historical Provider, MD  furosemide (LASIX) 40 MG tablet Take 1 tablet (40 mg total) by mouth 2 (two) times daily. 09/14/13  Yes Lars Masson, MD  glipiZIDE-metformin (METAGLIP) 5-500 MG per tablet Take 2 tablets by mouth 2 (two) times daily.   Yes Historical Provider, MD  lisinopril (PRINIVIL,ZESTRIL) 10 MG tablet Take 1 tablet (10 mg total) by mouth daily. 08/23/13  Yes Lonia Blood, MD  metoprolol succinate (TOPROL-XL) 50 MG 24 hr tablet Take 1 tablet (50 mg total) by mouth 2 (two) times daily. Take  with or immediately following a meal. 08/23/13  Yes Lonia Blood, MD  Multiple Vitamin (MULTIVITAMIN WITH MINERALS) TABS tablet Take 1 tablet by mouth daily.   Yes Historical Provider, MD  potassium chloride SA (K-DUR,KLOR-CON) 20 MEQ tablet Take 1 tablet (20 mEq total) by mouth daily. 08/24/13  Yes Lonia Blood, MD  pravastatin (PRAVACHOL) 40 MG tablet Take 40 mg by mouth at bedtime.   Yes Historical Provider, MD    Family History  Family History  Problem Relation Age of Onset  . Hypertension    . Hyperlipidemia      Social History  History   Social History  . Marital Status: Single    Spouse Name: N/A    Number of Children: N/A  .  Years of Education: N/A   Occupational History  . Not on file.   Social History Main Topics  . Smoking status: Never Smoker   . Smokeless tobacco: Not on file  . Alcohol Use: No  . Drug Use: No  . Sexual Activity: Not on file   Other Topics Concern  . Not on file   Social History Narrative  . No narrative on file     Review of Systems, as per HPI, otherwise negative General:  No chills, fever, night sweats or weight changes.  Cardiovascular:  No chest pain, dyspnea on exertion, edema, orthopnea, palpitations, paroxysmal nocturnal dyspnea. Dermatological: No rash, lesions/masses Respiratory: No cough, dyspnea Urologic: No hematuria, dysuria Abdominal:   No nausea, vomiting, diarrhea, bright red blood per rectum, melena, or hematemesis Neurologic:  No visual changes, wkns, changes in mental status. All other systems reviewed and are otherwise negative except as noted above.  Physical Exam  Blood pressure 128/62, pulse 58, height  (1.803 m), weight 216 lb (97.977 kg).  General: Pleasant, NAD Psych: Normal affect. Neuro: Alert and oriented X 3. Moves all extremities spontaneously. HEENT: Normal  Neck: Supple without bruits or JVD. Lungs:  Resp regular and unlabored, CTA. Heart: RRR no s3, s4, or murmurs. Abdomen: Soft, non-tender, non-distended, BS + x 4.  Extremities: No clubbing, cyanosis or edema. DP/PT/Radials 2+ and equal bilaterally.  Labs:  No results found for this basename: CKTOTAL, CKMB, TROPONINI,  in the last 72 hours Lab Results  Component Value Date   WBC 10.8* 03/14/2014   HGB 13.4 03/14/2014   HCT 40.1 03/14/2014   MCV 90.7 03/14/2014   PLT 225.0 03/14/2014   No results found for this basename: NA, K, CL, CO2, BUN, CREATININE, CALCIUM, LABALBU, PROT, BILITOT, ALKPHOS, ALT, AST, GLUCOSE,  in the last 168 hours Lab Results  Component Value Date   TRIG 76 08/18/2013   No results found for this basename: DDIMER   No components found with this  basename: POCBNP,   Accessory Clinical Findings  echocardiogram  ECG - a-fib, 76 BPM, non-specific ST and T wave abnormalities   Assessment & Plan  73 y/o male with PMH of HTN, HPL, DM, CAD, CHF admitted multiple times with  CHF exacerbation secondary to meds non-compliance, last time on 08/30/2013.  1. Chronic combined systolic and diastolic heart failure - increased 7 lbs in the last 3 months. - on discharge 205 lbs, today 216, with mild LE edema.  - increase Lasix to 80 QAM and 40 QPM Last labs normal, BNP 200 from 1400 in the hops[facility] in January.  2. Afib - rate controlled and on anticoagulation with Eliquis, he was seen by pharmacy and was approved for financial support for  Eliquis.  3. CAD - no current symptoms, continue current medications.   Follow up in 6 months, BMP prior to the visit.   Lars Masson, MD, [facility] 04/04/2014, 8:26 AM

## 2014-04-04 NOTE — Patient Instructions (Addendum)
Your physician has recommended you make the following change in your medication:   INCREASE YOUR LASIX TO 80 MG IN THE MORNING (2 TABS), AND 40 MG IN THE EVENING (1 TAB)   YOUR MD HAS PRESCRIBED YOU COMPRESSION STOCKINGS TO WEAR DAILY.   Your physician wants you to follow-up in:  6 MONTHS WITH DR Johnell Comings will receive a reminder letter in the mail two months in advance. If you don't receive a letter, please call our office to schedule the follow-up appointment.

## 2014-04-24 ENCOUNTER — Other Ambulatory Visit: Payer: Self-pay | Admitting: Cardiology

## 2014-04-30 ENCOUNTER — Telehealth (INDEPENDENT_AMBULATORY_CARE_PROVIDER_SITE_OTHER): Payer: Self-pay | Admitting: Internal Medicine

## 2014-04-30 ENCOUNTER — Ambulatory Visit (INDEPENDENT_AMBULATORY_CARE_PROVIDER_SITE_OTHER): Payer: Medicare Other | Admitting: Internal Medicine

## 2014-04-30 ENCOUNTER — Encounter (INDEPENDENT_AMBULATORY_CARE_PROVIDER_SITE_OTHER): Payer: Self-pay | Admitting: Internal Medicine

## 2014-04-30 VITALS — BP 146/62 | HR 64 | Temp 98.2°F | Ht 71.0 in | Wt 214.4 lb

## 2014-04-30 DIAGNOSIS — R195 Other fecal abnormalities: Secondary | ICD-10-CM | POA: Insufficient documentation

## 2014-04-30 NOTE — Progress Notes (Signed)
Subjective:    Patient ID: Randy Nunez, male    DOB: 1941/05/28, 73 y.o.   MRN: [id number]  HPI Referred to our office by Dr. Margo Aye for 2 positive stool cards. He tells me he ha not seen any rectal bleeding.  He usually has a BM x 2 a day and sometimes once a day. Appetite is good. No weight loss. No abdominal pain.  Had been taking Aleve for arthritis daily. Stopped Aleve 6 months ago.  Past hx significant for CAD. Patient at [facility] in January of this year with atrial fib and heart failure/respiratory distress.  Atrial fib new onset.  Ultimately ended up on vent.  Placed on  Eliquis for A fib.  Last colonoscopy 10 yrs ago by Dr. Karilyn Cota and was normal (per patient).    CBC    Component Value Date/Time   WBC 10.8* 03/14/2014 0953   RBC 4.42 03/14/2014 0953   HGB 13.4 03/14/2014 0953   HCT 40.1 03/14/2014 0953   PLT 225.0 03/14/2014 0953   MCV 90.7 03/14/2014 0953   MCH 30.1 08/29/2013 0456   MCHC 33.4 03/14/2014 0953   RDW 15.0 03/14/2014 0953   LYMPHSABS 3.0 08/28/2013 0025   MONOABS 0.9 08/28/2013 0025   EOSABS 0.3 08/28/2013 0025   BASOSABS 0.1 08/28/2013 0025      02/26/2014 H and H 12.1 and 35.9, MCV 88.2, Platelet ct 255.   ALP 55, AST 18, ALT 17.  Review of Systems     Past Medical History  Diagnosis Date  . Diabetes mellitus without complication   . Hypertension   . CAD (coronary artery disease)     a. s/p CABG 2001. b. NSTEMI 08/2013 - cath done, med rx recommended.  . Atrial fibrillation     a. Dx 08/2013. Placed on eliquis. Plan DCCV as outpt. Also has had WCT c/w aberrancy.  . Chronic combined systolic and diastolic CHF (congestive heart failure)   . Acute respiratory failure     a. 08/2013 - VDRF due to pulm edema.    Past Surgical History  Procedure Laterality Date  . Left heart cath  2015    Dr Allyson Sabal, with stenting  . Coronary artery bypass graft  2001    Allergies  Allergen Reactions  . Penicillins     Current Outpatient Prescriptions on File Prior to  Visit  Medication Sig Dispense Refill  . ALPRAZolam (XANAX) 0.5 MG tablet Take 0.5 mg by mouth daily as needed for anxiety.      Marland Kitchen aspirin 81 MG chewable tablet Chew 1 tablet (81 mg total) by mouth daily.      . calcium-vitamin D (OSCAL WITH D) 500-200 MG-UNIT per tablet Take 1 tablet by mouth daily.      Marland Kitchen ELIQUIS 5 MG TABS tablet TAKE (1) TABLET TWICE DAILY.  60 tablet  5  . furosemide (LASIX) 40 MG tablet TAKE 80 MG (2 TABS) IN THE MORNING AND 40 MG (1 TAB) IN THE EVENING  120 tablet  6  . glipiZIDE-metformin (METAGLIP) 5-500 MG per tablet Take 2 tablets by mouth 2 (two) times daily.      Marland Kitchen lisinopril (PRINIVIL,ZESTRIL) 10 MG tablet Take 1 tablet (10 mg total) by mouth daily.  30 tablet  0  . metoprolol succinate (TOPROL-XL) 50 MG 24 hr tablet Take 1 tablet (50 mg total) by mouth 2 (two) times daily. Take with or immediately following a meal.  60 tablet  0  . Multiple Vitamin (MULTIVITAMIN WITH  MINERALS) TABS tablet Take 1 tablet by mouth daily.      . potassium chloride SA (K-DUR,KLOR-CON) 20 MEQ tablet Take 1 tablet (20 mEq total) by mouth daily.  30 tablet  0  . pravastatin (PRAVACHOL) 40 MG tablet Take 40 mg by mouth at bedtime.       No current facility-administered medications on file prior to visit.     Objective:   Physical Exam  Filed Vitals:   04/30/14 0954  BP: 146/62  Pulse: 64  Temp: 98.2 F (36.8 C)  Height: 5\' 11"  (1.803 m)  Weight: 214 lb 6.4 oz (97.251 kg)   Alert and oriented. Skin warm and dry. Oral mucosa is moist.   . Sclera anicteric, conjunctivae is pink. Thyroid not enlarged. No cervical lymphadenopathy. Lungs clear. Heart regular rate and rhythm.  Abdomen is soft. Bowel sounds are positive. No hepatomegaly. No abdominal masses felt. No tenderness.  No edema to lower extremities.          Assessment & Plan:  Heme positive stool. Patient on anti-coagulant therapy for A fib. Patient does not want a colonoscopy till he is finished the Eliquis. He has 6  more months.  Colonoscopy declined. OV in 6 months.

## 2014-04-30 NOTE — Patient Instructions (Addendum)
Patient declined colonoscopy. Does not want to stop the Eliquis. If you have any rectal bleeding, please let our office know or go to the, or ED. Will put on a recall in 6 months. CBC in 3 months.

## 2014-04-30 NOTE — Telephone Encounter (Signed)
OV in 6 months. 

## 2014-05-02 ENCOUNTER — Encounter (INDEPENDENT_AMBULATORY_CARE_PROVIDER_SITE_OTHER): Payer: Self-pay | Admitting: *Deleted

## 2014-05-02 ENCOUNTER — Telehealth (INDEPENDENT_AMBULATORY_CARE_PROVIDER_SITE_OTHER): Payer: Self-pay | Admitting: *Deleted

## 2014-05-02 DIAGNOSIS — K921 Melena: Secondary | ICD-10-CM

## 2014-05-02 NOTE — Telephone Encounter (Signed)
Apt has been scheduled for 10/29/14 with Dorene Ar, NP.

## 2014-05-02 NOTE — Telephone Encounter (Signed)
.  Per Terri Setzer,NP patient to have labs in 3 months. 

## 2014-06-20 ENCOUNTER — Other Ambulatory Visit (INDEPENDENT_AMBULATORY_CARE_PROVIDER_SITE_OTHER): Payer: Self-pay | Admitting: *Deleted

## 2014-06-20 ENCOUNTER — Encounter (INDEPENDENT_AMBULATORY_CARE_PROVIDER_SITE_OTHER): Payer: Self-pay | Admitting: *Deleted

## 2014-06-20 DIAGNOSIS — K921 Melena: Secondary | ICD-10-CM

## 2014-07-12 ENCOUNTER — Encounter (HOSPITAL_COMMUNITY): Payer: Self-pay | Admitting: Cardiovascular Disease

## 2014-07-18 ENCOUNTER — Telehealth (INDEPENDENT_AMBULATORY_CARE_PROVIDER_SITE_OTHER): Payer: Self-pay | Admitting: *Deleted

## 2014-07-18 NOTE — Telephone Encounter (Signed)
Randy Nunez came by the office to let Terri know he can not afford to have his lab work done or schedule the procedure.

## 2014-07-20 NOTE — Telephone Encounter (Signed)
Noted  

## 2014-07-24 ENCOUNTER — Encounter (INDEPENDENT_AMBULATORY_CARE_PROVIDER_SITE_OTHER): Payer: Self-pay

## 2014-08-21 DIAGNOSIS — E785 Hyperlipidemia, unspecified: Secondary | ICD-10-CM | POA: Diagnosis not present

## 2014-08-21 DIAGNOSIS — E119 Type 2 diabetes mellitus without complications: Secondary | ICD-10-CM | POA: Diagnosis not present

## 2014-08-21 DIAGNOSIS — I10 Essential (primary) hypertension: Secondary | ICD-10-CM | POA: Diagnosis not present

## 2014-08-28 DIAGNOSIS — I480 Paroxysmal atrial fibrillation: Secondary | ICD-10-CM | POA: Diagnosis not present

## 2014-08-28 DIAGNOSIS — E785 Hyperlipidemia, unspecified: Secondary | ICD-10-CM | POA: Diagnosis not present

## 2014-08-28 DIAGNOSIS — I10 Essential (primary) hypertension: Secondary | ICD-10-CM | POA: Diagnosis not present

## 2014-08-28 DIAGNOSIS — E1165 Type 2 diabetes mellitus with hyperglycemia: Secondary | ICD-10-CM | POA: Diagnosis not present

## 2014-09-17 ENCOUNTER — Other Ambulatory Visit: Payer: Self-pay

## 2014-09-17 ENCOUNTER — Ambulatory Visit (INDEPENDENT_AMBULATORY_CARE_PROVIDER_SITE_OTHER): Payer: Medicare Other

## 2014-09-17 DIAGNOSIS — Z5181 Encounter for therapeutic drug level monitoring: Secondary | ICD-10-CM

## 2014-09-17 DIAGNOSIS — I4891 Unspecified atrial fibrillation: Secondary | ICD-10-CM

## 2014-09-17 DIAGNOSIS — E876 Hypokalemia: Secondary | ICD-10-CM

## 2014-09-17 LAB — BASIC METABOLIC PANEL
BUN: 15 mg/dL (ref 6–23)
CO2: 32 mEq/L (ref 19–32)
Calcium: 8.9 mg/dL (ref 8.4–10.5)
Chloride: 100 mEq/L (ref 96–112)
Creatinine, Ser: 1.01 mg/dL (ref 0.40–1.50)
GFR: 76.38 mL/min (ref >60.00)
Glucose, Bld: 194 mg/dL — ABNORMAL HIGH (ref 70–99)
Potassium: 3.2 mEq/L — ABNORMAL LOW (ref 3.5–5.1)
Sodium: 140 mEq/L (ref 135–145)

## 2014-09-17 LAB — CBC
HCT: 42.8 % (ref 39.0–52.0)
Hemoglobin: 14.4 g/dL (ref 13.0–17.0)
MCHC: 33.7 g/dL (ref 30.0–36.0)
MCV: 91.4 fl (ref 78.0–100.0)
Platelets: 254 10*3/uL (ref 150.0–400.0)
RBC: 4.68 Mil/uL (ref 4.22–5.81)
RDW: 13.6 % (ref 11.5–15.5)
WBC: 9.2 10*3/uL (ref 4.0–10.5)

## 2014-09-17 MED ORDER — POTASSIUM CHLORIDE CRYS ER 20 MEQ PO TBCR: 40.0000 meq | EXTENDED_RELEASE_TABLET | Freq: Every day | ORAL | Status: DC

## 2014-09-17 NOTE — Patient Instructions (Signed)

## 2014-09-17 NOTE — Progress Notes (Signed)
Pt was started on Eliquis for afib on 08/2013 by Dr Delton See.    Reviewed patients medication list.  Pt is not currently on any combined P-gp and strong CYP3A4 inhibitors/inducers (ketoconazole, traconazole, ritonavir, carbamazepine, phenytoin, rifampin, St. John's wort).  Reviewed labs.  SCr 1.01, Weight 95Kg, Age 74 yrs.  Dose  appropriate based on specified criteria.   Hgb and HCT 14.4/42.8. Pt states has had no problems in taking Eliquis and has not missed any doses and has not had any sign or symptom of bleeding or stroke.Is able to obtain Eliquis through insurance.  A full discussion of the nature of anticoagulants has been carried out.  A benefit/risk analysis has been presented to the patient, so that they understand the justification for choosing anticoagulation with Eliquis at this time.  The need for compliance is stressed.  Pt is aware to take the medication twice daily.  Side effects of potential bleeding are discussed, including unusual colored urine or stools, coughing up blood or coffee ground emesis, nose bleeds or serious fall or head trauma.  Discussed signs and symptoms of stroke. The patient should avoid any OTC items containing aspirin or ibuprofen.  Avoid alcohol consumption.   Call if any signs of abnormal bleeding.  Discussed financial obligations and resolved any difficulty in obtaining medication.  Next lab test test in 6 months.

## 2014-10-01 ENCOUNTER — Other Ambulatory Visit (INDEPENDENT_AMBULATORY_CARE_PROVIDER_SITE_OTHER): Payer: Medicare Other | Admitting: *Deleted

## 2014-10-01 ENCOUNTER — Ambulatory Visit (INDEPENDENT_AMBULATORY_CARE_PROVIDER_SITE_OTHER): Payer: Medicare Other | Admitting: Cardiology

## 2014-10-01 ENCOUNTER — Encounter: Payer: Self-pay | Admitting: Cardiology

## 2014-10-01 VITALS — BP 160/78 | HR 80 | Ht 71.0 in | Wt 216.8 lb

## 2014-10-01 DIAGNOSIS — E876 Hypokalemia: Secondary | ICD-10-CM

## 2014-10-01 DIAGNOSIS — I5043 Acute on chronic combined systolic (congestive) and diastolic (congestive) heart failure: Secondary | ICD-10-CM | POA: Diagnosis not present

## 2014-10-01 DIAGNOSIS — I4891 Unspecified atrial fibrillation: Secondary | ICD-10-CM

## 2014-10-01 DIAGNOSIS — I5042 Chronic combined systolic (congestive) and diastolic (congestive) heart failure: Secondary | ICD-10-CM

## 2014-10-01 DIAGNOSIS — I10 Essential (primary) hypertension: Secondary | ICD-10-CM | POA: Diagnosis not present

## 2014-10-01 MED ORDER — APIXABAN 5 MG PO TABS: ORAL_TABLET | ORAL | Status: AC

## 2014-10-01 MED ORDER — FUROSEMIDE 40 MG PO TABS: ORAL_TABLET | ORAL | Status: DC

## 2014-10-01 MED ORDER — LISINOPRIL 10 MG PO TABS: 10.0000 mg | ORAL_TABLET | Freq: Every day | ORAL | Status: DC

## 2014-10-01 MED ORDER — DILTIAZEM HCL ER COATED BEADS 120 MG PO TB24: 360.0000 mg | ORAL_TABLET | Freq: Every day | ORAL | Status: DC

## 2014-10-01 MED ORDER — POTASSIUM CHLORIDE CRYS ER 20 MEQ PO TBCR: 40.0000 meq | EXTENDED_RELEASE_TABLET | Freq: Every day | ORAL | Status: AC

## 2014-10-01 MED ORDER — PRAVASTATIN SODIUM 40 MG PO TABS: 40.0000 mg | ORAL_TABLET | Freq: Every day | ORAL | Status: AC

## 2014-10-01 MED ORDER — METOPROLOL SUCCINATE ER 50 MG PO TB24: 50.0000 mg | ORAL_TABLET | Freq: Two times a day (BID) | ORAL | Status: AC

## 2014-10-01 NOTE — Patient Instructions (Signed)
Your physician recommends that you continue on your current medications as directed. Please refer to the Current Medication list given to you today.  Your physician wants you to follow-up in: 6 MONTHS with Dr. Nelson. You will receive a reminder letter in the mail two months in advance. If you don't receive a letter, please call our office to schedule the follow-up appointment.  

## 2014-10-01 NOTE — Progress Notes (Signed)
Patient ID: SHIRRELL PRUETTE, male   DOB: February 19, 1941, 74 y.o.   MRN: [id number] Patient ID: CHANCETON BOOHER, male   DOB: Aug 08, 1940, 74 y.o.   MRN: [id number]    Patient Name: Randy Nunez Date of Encounter: 10/01/2014  Primary Care Provider:  Catalina Pizza, MD Primary Cardiologist:  Lars Masson  Problem List   Past Medical History  Diagnosis Date  . Diabetes mellitus without complication     x  8 yrs.  . Hypertension   . CAD (coronary artery disease)     a. s/p CABG 2001. b. NSTEMI 08/2013 - cath done, med rx recommended.  . Atrial fibrillation     a. Dx 08/2013. Placed on eliquis. Plan DCCV as outpt. Also has had WCT c/w aberrancy.  . Chronic combined systolic and diastolic CHF (congestive heart failure)   . Acute respiratory failure     a. 08/2013 - VDRF due to pulm edema.   Past Surgical History  Procedure Laterality Date  . Left heart cath  2015    Dr Allyson Sabal, with stenting  . Coronary artery bypass graft  2001  . Left heart catheterization with coronary angiogram N/A 08/21/2013    Procedure: LEFT HEART CATHETERIZATION WITH CORONARY ANGIOGRAM;  Surgeon: Runell Gess, MD;  Location: [facility] CATH LAB;  Service: Cardiovascular;  Laterality: N/A;   Allergies  Allergies  Allergen Reactions  . Penicillins    HPI  74 y/o male with PMH of HTN, HPL, DM, CAD, s/p CABG in 2001, chronic systolic CHF admitted with CHF exacerbation secondary to meds non-compliance on 08/30/2013.  He had cardiac cath on 08/21/2013 Which demonstrated patent grafts with the exception of total occlusion of SVG to diag. Medical therapy was recommended. Atrial fib was new diagnosis. He was started on apixaban with plans for DCCV after one month of anticoagulation. Echo at that time demonstrated EF of 40-45%. There was a question of tachycardic mediated CM vs ICM. He was continued on ACE, BB and statin. He was diuresed 13 lbs (wt down to 202 lbs) during hospitalization and discharged on 08/23/2013. Actos was  discontinued.  After discharge he didn't take his Lasix and was admitted again on 08/30/2013, diuresed 12 pounds again and discharged.  Today he states that the last week he went to [facility] again for SOB, they diuresed him overnight and sent home. He is asymptomatic today and states that he is complaint to his meds.  04/04/2014 - He is coming after 3 months, feels well, no DOE, mild stable LE edema, no orthopnea or PND. No chest pain. He works as a Cabin crew. His legs swell up after prolonged sitting.  10/01/2014 - the patient is coming after 6 months, at the last visit his Lasix was increased to oral 80 mg morning, and 40 mg in the afternoon. Since then his lower extremity edema has resolved. He has stable dyspnea on moderate exertion. Denies any orthopnea, paroxysmal nocturnal dyspnea or chest pain. His only problem today is arthritic pain in his knees.  Home Medications  Prior to Admission medications   Medication Sig Start Date End Date Taking? Authorizing Provider  ALPRAZolam Prudy Feeler) 0.5 MG tablet Take 0.5 mg by mouth daily as needed for anxiety.   Yes Historical Provider, MD  apixaban (ELIQUIS) 5 MG TABS tablet Take 1 tablet (5 mg total) by mouth 2 (two) times daily. 09/14/13  Yes Lars Masson, MD  aspirin 81 MG chewable tablet Chew 1 tablet (81 mg total) by  mouth daily. 08/23/13  Yes Lonia Blood, MD  calcium-vitamin D (OSCAL WITH D) 500-200 MG-UNIT per tablet Take 1 tablet by mouth daily.   Yes Historical Provider, MD  furosemide (LASIX) 40 MG tablet Take 1 tablet (40 mg total) by mouth 2 (two) times daily. 09/14/13  Yes Lars Masson, MD  glipiZIDE-metformin (METAGLIP) 5-500 MG per tablet Take 2 tablets by mouth 2 (two) times daily.   Yes Historical Provider, MD  lisinopril (PRINIVIL,ZESTRIL) 10 MG tablet Take 1 tablet (10 mg total) by mouth daily. 08/23/13  Yes Lonia Blood, MD  metoprolol succinate (TOPROL-XL) 50 MG 24 hr tablet Take 1 tablet (50 mg total) by mouth 2  (two) times daily. Take with or immediately following a meal. 08/23/13  Yes Lonia Blood, MD  Multiple Vitamin (MULTIVITAMIN WITH MINERALS) TABS tablet Take 1 tablet by mouth daily.   Yes Historical Provider, MD  potassium chloride SA (K-DUR,KLOR-CON) 20 MEQ tablet Take 1 tablet (20 mEq total) by mouth daily. 08/24/13  Yes Lonia Blood, MD  pravastatin (PRAVACHOL) 40 MG tablet Take 40 mg by mouth at bedtime.   Yes Historical Provider, MD    Family History  Family History  Problem Relation Age of Onset  . Hypertension    . Hyperlipidemia    . Alzheimer's disease Mother   . Heart attack Father     Social History  History   Social History  . Marital Status: Single    Spouse Name: N/A  . Number of Children: N/A  . Years of Education: N/A   Occupational History  . Not on file.   Social History Main Topics  . Smoking status: Never Smoker   . Smokeless tobacco: Not on file  . Alcohol Use: No  . Drug Use: No  . Sexual Activity: Not on file   Other Topics Concern  . Not on file   Social History Narrative     Review of Systems, as per HPI, otherwise negative General:  No chills, fever, night sweats or weight changes.  Cardiovascular:  No chest pain, dyspnea on exertion, edema, orthopnea, palpitations, paroxysmal nocturnal dyspnea. Dermatological: No rash, lesions/masses Respiratory: No cough, dyspnea Urologic: No hematuria, dysuria Abdominal:   No nausea, vomiting, diarrhea, bright red blood per rectum, melena, or hematemesis Neurologic:  No visual changes, wkns, changes in mental status. All other systems reviewed and are otherwise negative except as noted above.  Physical Exam  Blood pressure 160/78, pulse 80, height  (1.803 m), weight 216 lb 12.8 oz (98.34 kg), SpO2 98 %.  General: Pleasant, NAD Psych: Normal affect. Neuro: Alert and oriented X 3. Moves all extremities spontaneously. HEENT: Normal  Neck: Supple without bruits or JVD. Lungs:  Resp  regular and unlabored, CTA. Heart: RRR no s3, s4, or murmurs. Abdomen: Soft, non-tender, non-distended, BS + x 4.  Extremities: No clubbing, cyanosis or edema. DP/PT/Radials 2+ and equal bilaterally.  Labs:  No results for input(s): CKTOTAL, CKMB, TROPONINI in the last 72 hours. Lab Results  Component Value Date   WBC 9.2 09/17/2014   HGB 14.4 09/17/2014   HCT 42.8 09/17/2014   MCV 91.4 09/17/2014   PLT 254.0 09/17/2014   No results for input(s): NA, K, CL, CO2, BUN, CREATININE, CALCIUM, PROT, BILITOT, ALKPHOS, ALT, AST, GLUCOSE in the last 168 hours.  Invalid input(s): LABALBU Lab Results  Component Value Date   TRIG 76 08/18/2013   No results found for: DDIMER Invalid input(s): POCBNP  Accessory Clinical Findings  Echocardiogram - 08/2013 Left ventricle: The cavity size was mildly dilated. Wall thickness was increased in a pattern of mild LVH. Systolic function was mildly to moderately reduced. The estimated ejection fraction was in the range of 40% to 45%. Wall motion was normal; there were no regional wall motion abnormalities. - Left atrium: The atrium was moderately dilated. - Right ventricle: The cavity size was moderately dilated. Wall thickness was normal. Systolic function was moderately reduced. - Right atrium: The atrium was moderately dilated. - Pulmonary arteries: Systolic pressure was mildly increased. PA peak pressure: 39mm Hg (S).  ECG - a-fib, 76 BPM, non-specific ST and T wave abnormalities   Assessment & Plan  74 y/o male with PMH of HTN, HPL, DM, CAD, CHF admitted multiple times with  CHF exacerbation secondary to meds non-compliance, last time on 08/30/2013.  1. Chronic combined systolic and diastolic heart failure - Despite having 216 pounds what is the same weight is at the last visit he appears euvolemic and is  asymptomatic - Continue Lasix to 80 QAM and 40 QPM Last labs normal, BNP 200 from 1400 in the hopsital in  January.  2. Afib - rate controlled and on anticoagulation with Eliquis, he was seen by pharmacy and was approved for financial support for Eliquis. The patient would like to stop anticoagulation however his CHAD-VASc is for and he is very high risk for stroke he agrees to continue.  3. Hypertension - patient's blood pressures elevated however he hasn't taken his meds for today yet.  4. CAD - no current symptoms, continue current medications.   5. Hypokalemia - on the labs 2 weeks ago the patient doesn't want to recheck today as he is concerned about financial burden however his potassium has been increased.  Follow up in 6 months.  Lars Masson, MD, [facility] 10/01/2014, 8:24 AM

## 2014-10-29 ENCOUNTER — Ambulatory Visit (INDEPENDENT_AMBULATORY_CARE_PROVIDER_SITE_OTHER): Payer: Medicare Other | Admitting: Internal Medicine

## 2014-11-30 ENCOUNTER — Other Ambulatory Visit (HOSPITAL_COMMUNITY): Payer: Self-pay | Admitting: Internal Medicine

## 2014-11-30 ENCOUNTER — Ambulatory Visit (HOSPITAL_COMMUNITY)
Admission: RE | Admit: 2014-11-30 | Discharge: 2014-11-30 | Disposition: A | Discharge status: Home / Self Care | Payer: Medicare Other | Source: Ambulatory Visit | Attending: Internal Medicine | Admitting: Internal Medicine

## 2014-11-30 DIAGNOSIS — I480 Paroxysmal atrial fibrillation: Secondary | ICD-10-CM | POA: Diagnosis not present

## 2014-11-30 DIAGNOSIS — E1165 Type 2 diabetes mellitus with hyperglycemia: Secondary | ICD-10-CM | POA: Diagnosis not present

## 2014-11-30 DIAGNOSIS — R52 Pain, unspecified: Secondary | ICD-10-CM

## 2014-11-30 DIAGNOSIS — M25552 Pain in left hip: Secondary | ICD-10-CM | POA: Diagnosis not present

## 2014-11-30 DIAGNOSIS — E785 Hyperlipidemia, unspecified: Secondary | ICD-10-CM | POA: Diagnosis not present

## 2014-11-30 DIAGNOSIS — S79912A Unspecified injury of left hip, initial encounter: Secondary | ICD-10-CM | POA: Diagnosis not present

## 2014-11-30 DIAGNOSIS — I10 Essential (primary) hypertension: Secondary | ICD-10-CM | POA: Diagnosis not present

## 2014-12-10 DIAGNOSIS — I10 Essential (primary) hypertension: Secondary | ICD-10-CM | POA: Diagnosis not present

## 2014-12-10 DIAGNOSIS — E782 Mixed hyperlipidemia: Secondary | ICD-10-CM | POA: Diagnosis not present

## 2014-12-10 DIAGNOSIS — E119 Type 2 diabetes mellitus without complications: Secondary | ICD-10-CM | POA: Diagnosis not present

## 2014-12-12 DIAGNOSIS — I10 Essential (primary) hypertension: Secondary | ICD-10-CM | POA: Diagnosis not present

## 2014-12-12 DIAGNOSIS — E782 Mixed hyperlipidemia: Secondary | ICD-10-CM | POA: Diagnosis not present

## 2014-12-12 DIAGNOSIS — I509 Heart failure, unspecified: Secondary | ICD-10-CM | POA: Diagnosis not present

## 2014-12-12 DIAGNOSIS — E1165 Type 2 diabetes mellitus with hyperglycemia: Secondary | ICD-10-CM | POA: Diagnosis not present

## 2015-02-09 ENCOUNTER — Other Ambulatory Visit: Payer: Self-pay | Admitting: Cardiology

## 2015-04-11 DIAGNOSIS — Z125 Encounter for screening for malignant neoplasm of prostate: Secondary | ICD-10-CM | POA: Diagnosis not present

## 2015-04-11 DIAGNOSIS — E1165 Type 2 diabetes mellitus with hyperglycemia: Secondary | ICD-10-CM | POA: Diagnosis not present

## 2015-04-11 DIAGNOSIS — I10 Essential (primary) hypertension: Secondary | ICD-10-CM | POA: Diagnosis not present

## 2015-04-11 DIAGNOSIS — E782 Mixed hyperlipidemia: Secondary | ICD-10-CM | POA: Diagnosis not present

## 2015-04-15 DIAGNOSIS — I10 Essential (primary) hypertension: Secondary | ICD-10-CM | POA: Diagnosis not present

## 2015-04-15 DIAGNOSIS — E1165 Type 2 diabetes mellitus with hyperglycemia: Secondary | ICD-10-CM | POA: Diagnosis not present

## 2015-04-15 DIAGNOSIS — E785 Hyperlipidemia, unspecified: Secondary | ICD-10-CM | POA: Diagnosis not present

## 2015-04-15 DIAGNOSIS — I482 Chronic atrial fibrillation: Secondary | ICD-10-CM | POA: Diagnosis not present

## 2015-04-15 DIAGNOSIS — I509 Heart failure, unspecified: Secondary | ICD-10-CM | POA: Diagnosis not present

## 2015-04-18 ENCOUNTER — Ambulatory Visit (INDEPENDENT_AMBULATORY_CARE_PROVIDER_SITE_OTHER): Payer: Medicare Other | Admitting: *Deleted

## 2015-04-18 DIAGNOSIS — I4891 Unspecified atrial fibrillation: Secondary | ICD-10-CM | POA: Diagnosis not present

## 2015-04-18 NOTE — Progress Notes (Signed)
Pt was started on Eliquis 5mg  twice a day for Afib on 08/2013 by Dr. Delton See.    Reviewed patients medication list.  Pt is not currently on any combined P-gp and strong CYP3A4 inhibitors/inducers (ketoconazole, traconazole, ritonavir, carbamazepine, phenytoin, rifampin, St. John's wort).  Reviewed labs.  SCr-1.37, Hgb-12.7 and HCT-36.6, and Weight-95kg.  Dose is appropriate based on dosing criteria.    Labs were done at Dr. Carlena Hurl office on 04/11/2015 and a copy has been received.  The copy of the lab results are being sent to Medical Records to be scanned in the chart.    A full discussion of the nature of anticoagulants has been carried out.  A benefit/risk analysis has been presented to the patient, so that they understand the justification for choosing anticoagulation with Eliquis at this time.  The need for compliance is stressed.  Pt is aware to take the medication twice daily.  Side effects of potential bleeding are discussed, including unusual colored urine or stools, coughing up blood or coffee ground emesis, nose bleeds or serious fall or head trauma.  Discussed signs and symptoms of stroke. The patient should avoid any OTC items containing aspirin or ibuprofen.  Avoid alcohol consumption.   Call if any signs of abnormal bleeding.  Discussed financial obligations and resolved any difficulty in obtaining medication.  Next lab test test in 6 months.   Januvia was discontinued on 04/12/2015 per Dr. [name] due to increase in creatinine and the patient has a follow up in December regarding labs-creatinine. Patient states he has no financial issues with obtaining the medication but is seeking assistance with paying some household obligations.  Advised to call with any issues and he verbalized understanding.

## 2015-05-02 ENCOUNTER — Encounter: Payer: Self-pay | Admitting: Cardiology

## 2015-07-19 DIAGNOSIS — E1165 Type 2 diabetes mellitus with hyperglycemia: Secondary | ICD-10-CM | POA: Diagnosis not present

## 2015-07-30 DIAGNOSIS — I509 Heart failure, unspecified: Secondary | ICD-10-CM | POA: Diagnosis not present

## 2015-07-30 DIAGNOSIS — E782 Mixed hyperlipidemia: Secondary | ICD-10-CM | POA: Diagnosis not present

## 2015-07-30 DIAGNOSIS — I10 Essential (primary) hypertension: Secondary | ICD-10-CM | POA: Diagnosis not present

## 2015-07-30 DIAGNOSIS — E1165 Type 2 diabetes mellitus with hyperglycemia: Secondary | ICD-10-CM | POA: Diagnosis not present

## 2015-07-30 DIAGNOSIS — R944 Abnormal results of kidney function studies: Secondary | ICD-10-CM | POA: Diagnosis not present

## 2015-08-29 ENCOUNTER — Emergency Department (HOSPITAL_COMMUNITY): Payer: PPO

## 2015-08-29 ENCOUNTER — Encounter (HOSPITAL_COMMUNITY): Payer: Self-pay

## 2015-08-29 ENCOUNTER — Inpatient Hospital Stay (HOSPITAL_COMMUNITY)
Admission: EM | Admit: 2015-08-29 | Discharge: 2015-08-31 | DRG: 280 | Disposition: A | Discharge status: Home / Self Care | Payer: PPO | Attending: Internal Medicine | Admitting: Internal Medicine

## 2015-08-29 DIAGNOSIS — Z951 Presence of aortocoronary bypass graft: Secondary | ICD-10-CM

## 2015-08-29 DIAGNOSIS — R002 Palpitations: Secondary | ICD-10-CM | POA: Diagnosis not present

## 2015-08-29 DIAGNOSIS — Z23 Encounter for immunization: Secondary | ICD-10-CM

## 2015-08-29 DIAGNOSIS — Z7901 Long term (current) use of anticoagulants: Secondary | ICD-10-CM

## 2015-08-29 DIAGNOSIS — R7989 Other specified abnormal findings of blood chemistry: Secondary | ICD-10-CM

## 2015-08-29 DIAGNOSIS — I2510 Atherosclerotic heart disease of native coronary artery without angina pectoris: Secondary | ICD-10-CM | POA: Diagnosis present

## 2015-08-29 DIAGNOSIS — R0600 Dyspnea, unspecified: Secondary | ICD-10-CM | POA: Diagnosis present

## 2015-08-29 DIAGNOSIS — I4891 Unspecified atrial fibrillation: Principal | ICD-10-CM | POA: Diagnosis present

## 2015-08-29 DIAGNOSIS — Z7982 Long term (current) use of aspirin: Secondary | ICD-10-CM

## 2015-08-29 DIAGNOSIS — R778 Other specified abnormalities of plasma proteins: Secondary | ICD-10-CM | POA: Diagnosis present

## 2015-08-29 DIAGNOSIS — E1165 Type 2 diabetes mellitus with hyperglycemia: Secondary | ICD-10-CM | POA: Diagnosis present

## 2015-08-29 DIAGNOSIS — Z820 Family history of epilepsy and other diseases of the nervous system: Secondary | ICD-10-CM

## 2015-08-29 DIAGNOSIS — I509 Heart failure, unspecified: Secondary | ICD-10-CM | POA: Insufficient documentation

## 2015-08-29 DIAGNOSIS — I214 Non-ST elevation (NSTEMI) myocardial infarction: Secondary | ICD-10-CM | POA: Diagnosis present

## 2015-08-29 DIAGNOSIS — I252 Old myocardial infarction: Secondary | ICD-10-CM

## 2015-08-29 DIAGNOSIS — R739 Hyperglycemia, unspecified: Secondary | ICD-10-CM

## 2015-08-29 DIAGNOSIS — E119 Type 2 diabetes mellitus without complications: Secondary | ICD-10-CM

## 2015-08-29 DIAGNOSIS — Z7984 Long term (current) use of oral hypoglycemic drugs: Secondary | ICD-10-CM

## 2015-08-29 DIAGNOSIS — N179 Acute kidney failure, unspecified: Secondary | ICD-10-CM | POA: Diagnosis present

## 2015-08-29 DIAGNOSIS — E785 Hyperlipidemia, unspecified: Secondary | ICD-10-CM | POA: Diagnosis present

## 2015-08-29 DIAGNOSIS — I10 Essential (primary) hypertension: Secondary | ICD-10-CM | POA: Diagnosis present

## 2015-08-29 DIAGNOSIS — I110 Hypertensive heart disease with heart failure: Secondary | ICD-10-CM | POA: Diagnosis present

## 2015-08-29 DIAGNOSIS — I5043 Acute on chronic combined systolic (congestive) and diastolic (congestive) heart failure: Secondary | ICD-10-CM | POA: Diagnosis present

## 2015-08-29 DIAGNOSIS — Z8249 Family history of ischemic heart disease and other diseases of the circulatory system: Secondary | ICD-10-CM

## 2015-08-29 LAB — PROTIME-INR
INR: 1.15 (ref 0.00–1.49)
Prothrombin Time: 14.9 seconds (ref 11.6–15.2)

## 2015-08-29 LAB — BASIC METABOLIC PANEL WITH GFR
Anion gap: 17 — ABNORMAL HIGH (ref 5–15)
BUN: 37 mg/dL — ABNORMAL HIGH (ref 6–20)
CO2: 25 mmol/L (ref 22–32)
Calcium: 9.7 mg/dL (ref 8.9–10.3)
Chloride: 95 mmol/L — ABNORMAL LOW (ref 101–111)
Creatinine, Ser: 2.32 mg/dL — ABNORMAL HIGH (ref 0.61–1.24)
GFR calc Af Amer: 30 mL/min — ABNORMAL LOW
GFR calc non Af Amer: 26 mL/min — ABNORMAL LOW
Glucose, Bld: 411 mg/dL — ABNORMAL HIGH (ref 65–99)
Potassium: 3.6 mmol/L (ref 3.5–5.1)
Sodium: 137 mmol/L (ref 135–145)

## 2015-08-29 LAB — CBC
HCT: 47.9 % (ref 39.0–52.0)
Hemoglobin: 16.4 g/dL (ref 13.0–17.0)
MCH: 31.6 pg (ref 26.0–34.0)
MCHC: 34.2 g/dL (ref 30.0–36.0)
MCV: 92.3 fL (ref 78.0–100.0)
PLATELETS: 314 10*3/uL (ref 150–400)
RBC: 5.19 MIL/uL (ref 4.22–5.81)
RDW: 13.1 % (ref 11.5–15.5)
WBC: 21.6 10*3/uL — AB (ref 4.0–10.5)

## 2015-08-29 LAB — MAGNESIUM: MAGNESIUM: 2 mg/dL (ref 1.7–2.4)

## 2015-08-29 LAB — BRAIN NATRIURETIC PEPTIDE: B Natriuretic Peptide: 304 pg/mL — ABNORMAL HIGH (ref 0.0–100.0)

## 2015-08-29 MED ORDER — ENOXAPARIN SODIUM 150 MG/ML ~~LOC~~ SOLN: 1.0000 mg/kg | Freq: Two times a day (BID) | SUBCUTANEOUS | Status: DC

## 2015-08-29 MED ORDER — DILTIAZEM LOAD VIA INFUSION
15.0000 mg | Freq: Once | INTRAVENOUS | Status: AC
  Administered 2015-08-29: 15 mg via INTRAVENOUS
  Filled 2015-08-29: qty 15

## 2015-08-29 MED ORDER — DILTIAZEM HCL 100 MG IV SOLR
5.0000 mg/h | INTRAVENOUS | Status: DC
  Administered 2015-08-29: 5 mg/h via INTRAVENOUS
  Administered 2015-08-30: 15 mg/h via INTRAVENOUS
  Filled 2015-08-29 (×2): qty 100

## 2015-08-29 MED ORDER — IPRATROPIUM-ALBUTEROL 0.5-2.5 (3) MG/3ML IN SOLN
3.0000 mL | Freq: Once | RESPIRATORY_TRACT | Status: AC
  Administered 2015-08-29: 3 mL via RESPIRATORY_TRACT
  Filled 2015-08-29: qty 3

## 2015-08-29 MED ORDER — FUROSEMIDE 10 MG/ML IJ SOLN
40.0000 mg | Freq: Once | INTRAMUSCULAR | Status: AC
  Administered 2015-08-29: 40 mg via INTRAVENOUS
  Filled 2015-08-29: qty 4

## 2015-08-29 NOTE — ED Provider Notes (Addendum)
CSN: [id number]     Arrival date & time 08/29/15  2206 History  By signing my name below, I, [facility], attest that this documentation has been prepared under the direction and in the presence of Linwood Dibbles, MD. Electronically Signed: Randell Patient, ED Scribe. 08/29/2015. 11:50 PM.   Chief Complaint  Patient presents with  . Shortness of Breath   The history is provided by the patient. No language interpreter was used.   HPI Comments: Randy Nunez is a 75 y.o. male with an hx of chronic systolic and diastolic CHF, acute respiratory failure,  A-Fib, CAD, HTN, and DM who presents to the Emergency Department complaining of constant, gradually worsening, SOB onset 1 hour ago. Patient reports that he attended a funeral today and has been busy and stressed out. He takes eliquis and lasix for treatment of his A-Fib. Per patient, he has an hx of CHF. He denies CP,  palpitations, cough, and leg edema,   Cardiologist: Dr. Lars Masson Past Medical History  Diagnosis Date  . Diabetes mellitus without complication (HCC)     x  8 yrs.  . Hypertension   . CAD (coronary artery disease)     a. s/p CABG 2001. b. NSTEMI 08/2013 - cath done, med rx recommended.  . Atrial fibrillation (HCC)     a. Dx 08/2013. Placed on eliquis. Plan DCCV as outpt. Also has had WCT c/w aberrancy.  . Chronic combined systolic and diastolic CHF (congestive heart failure) (HCC)   . Acute respiratory failure (HCC)     a. 08/2013 - VDRF due to pulm edema.   Past Surgical History  Procedure Laterality Date  . Left heart cath  2015    Dr Allyson Sabal, with stenting  . Coronary artery bypass graft  2001  . Left heart catheterization with coronary angiogram N/A 08/21/2013    Procedure: LEFT HEART CATHETERIZATION WITH CORONARY ANGIOGRAM;  Surgeon: Runell Gess, MD;  Location: [facility] CATH LAB;  Service: Cardiovascular;  Laterality: N/A;   Family History  Problem Relation Age of Onset  . Hypertension    .  Hyperlipidemia    . Alzheimer's disease Mother   . Heart attack Father    Social History  Substance Use Topics  . Smoking status: Never Smoker   . Smokeless tobacco: None  . Alcohol Use: No    Review of Systems  Respiratory: Positive for shortness of breath. Negative for cough.   Cardiovascular: Negative for chest pain, palpitations and leg swelling.      Allergies  Penicillins  Home Medications   Prior to Admission medications   Medication Sig Start Date End Date Taking? Authorizing Provider  ALPRAZolam Prudy Feeler) 0.5 MG tablet Take 0.5 mg by mouth daily as needed for anxiety.    Historical Provider, MD  apixaban (ELIQUIS) 5 MG TABS tablet TAKE (1) TABLET TWICE DAILY. 10/01/14   Lars Masson, MD  aspirin 81 MG chewable tablet Chew 1 tablet (81 mg total) by mouth daily. 08/23/13   Lonia Blood, MD  calcium-vitamin D (OSCAL WITH D) 500-200 MG-UNIT per tablet Take 1 tablet by mouth daily.    Historical Provider, MD  diltiazem (CARDIZEM LA) 120 MG 24 hr tablet Take 3 tablets (360 mg total) by mouth daily. 10/01/14   Lars Masson, MD  furosemide (LASIX) 40 MG tablet TAKE 80 MG (2 TABS) IN THE MORNING AND 40 MG (1 TAB) IN THE EVENING 10/01/14   Lars Masson, MD  glipiZIDE-metformin (METAGLIP) 5-500  MG per tablet Take 2 tablets by mouth 2 (two) times daily.    Historical Provider, MD  lisinopril (PRINIVIL,ZESTRIL) 10 MG tablet Take 1 tablet (10 mg total) by mouth daily. 10/01/14   Lars Masson, MD  metoprolol succinate (TOPROL-XL) 50 MG 24 hr tablet Take 1 tablet (50 mg total) by mouth 2 (two) times daily. Take with or immediately following a meal. 10/01/14   Lars Masson, MD  Multiple Vitamin (MULTIVITAMIN WITH MINERALS) TABS tablet Take 1 tablet by mouth daily.    Historical Provider, MD  potassium chloride SA (K-DUR,KLOR-CON) 20 MEQ tablet Take 2 tablets (40 mEq total) by mouth daily. 10/01/14   Lars Masson, MD  pravastatin (PRAVACHOL) 40 MG tablet Take 1  tablet (40 mg total) by mouth at bedtime. 10/01/14   Lars Masson, MD   BP 147/97 mmHg  Pulse 143  Temp(Src) 98.7 F (37.1 C) (Oral)  Resp 20  SpO2 92% Physical Exam  Constitutional: He appears well-developed and well-nourished. He appears distressed.  HENT:  Head: Normocephalic and atraumatic.  Right Ear: External ear normal.  Left Ear: External ear normal.  Eyes: Conjunctivae are normal. Right eye exhibits no discharge. Left eye exhibits no discharge. No scleral icterus.  Neck: Neck supple. No tracheal deviation present.  Cardiovascular: Intact distal pulses.  An irregularly irregular rhythm present. Tachycardia present.   Pulmonary/Chest: Effort normal. No stridor. Tachypnea noted. No respiratory distress. He has no wheezes. He has rales.  Abdominal: Soft. Bowel sounds are normal. He exhibits no distension. There is no tenderness. There is no rebound and no guarding.  Musculoskeletal: He exhibits no edema or tenderness.  Neurological: He is alert. He has normal strength. No cranial nerve deficit (no facial droop, extraocular movements intact, no slurred speech) or sensory deficit. He exhibits normal muscle tone. He displays no seizure activity. Coordination normal.  Skin: Skin is warm and dry. No rash noted.  Psychiatric: He has a normal mood and affect.  Nursing note and vitals reviewed.   ED Course  Procedures   DIAGNOSTIC STUDIES: Oxygen Saturation is 92% on 3 L/min, low by my interpretation.    COORDINATION OF CARE: 10:22 PM Ordered Duoneb and EKG. Will order Cardizem, chest x-ray, labs, and EKG. Discussed treatment plan with pt at bedside and pt agreed to plan. CRITICAL CARE Performed by: JJHER,DEY Total critical care time: 35 minutes Critical care time was exclusive of separately billable procedures and treating other patients. Critical care was necessary to treat or prevent imminent or life-threatening deterioration. Critical care was time spent personally by me  on the following activities: development of treatment plan with patient and/or surrogate as well as nursing, discussions with consultants, evaluation of patient's response to treatment, examination of patient, obtaining history from patient or surrogate, ordering and performing treatments and interventions, ordering and review of laboratory studies, ordering and review of radiographic studies, pulse oximetry and re-evaluation of patient's condition.   Labs Review Labs Reviewed  CBC - Abnormal; Notable for the following:    WBC 21.6 (*)    All other components within normal limits  BASIC METABOLIC PANEL - Abnormal; Notable for the following:    Chloride 95 (*)    Glucose, Bld 411 (*)    BUN 37 (*)    Creatinine, Ser 2.32 (*)    GFR calc non Af Amer 26 (*)    GFR calc Af Amer 30 (*)    Anion gap 17 (*)    All other components within  normal limits  BRAIN NATRIURETIC PEPTIDE - Abnormal; Notable for the following:    B Natriuretic Peptide 304.0 (*)    All other components within normal limits  PROTIME-INR  MAGNESIUM  I-STAT TROPOININ, ED    Imaging Review Dg Chest Port 1 View  08/29/2015  CLINICAL DATA:  Shortness of breath beginning approximately 1 hour ago. EXAM: PORTABLE CHEST 1 VIEW COMPARISON:  Single view of the chest 08/28/2013. FINDINGS: There is cardiomegaly. Increased airspace opacities are present in the mid and lower lung zones bilaterally likely due to pulmonary edema. No pneumothorax or pleural effusion. IMPRESSION: Findings most consistent with congestive heart failure. Electronically Signed   By: Drusilla Kanner M.D.   On: 08/29/2015 22:41   I have personally reviewed and evaluated these images and lab results as part of my medical decision-making.   EKG Interpretation   Date/Time:  Thursday August 29 2015 22:15:19 EST Ventricular Rate:  156 PR Interval:  145 QRS Duration: 100 QT Interval:  296 QTC Calculation: 477 R Axis:   179 Text Interpretation:  Atrial  fibrillation with rapid ventricular response  Ventricular premature complex Right axis deviation Repolarization  abnormality, prob rate related Since last tracing rate faster Confirmed by  Srinivas Lippman  MD-J, Tonyia Marschall ([id number]) on 08/29/2015 10:20:35 PM     Medications  diltiazem (CARDIZEM) 1 mg/mL load via infusion 15 mg (15 mg Intravenous Given 08/29/15 2317)    And  diltiazem (CARDIZEM) 100 mg in dextrose 5 % 100 mL (1 mg/mL) infusion (10 mg/hr Intravenous Rate/Dose Change 08/29/15 2351)  insulin aspart (novoLOG) injection 8 Units (not administered)  ipratropium-albuterol (DUONEB) 0.5-2.5 (3) MG/3ML nebulizer solution 3 mL (3 mLs Nebulization Given 08/29/15 2219)  furosemide (LASIX) injection 40 mg (40 mg Intravenous Given 08/29/15 2351)   2350  Will increase cardizem drip to 10.  Heart rate is still 110  MDM   Final diagnoses:  Atrial fibrillation with rapid ventricular response (HCC)  Acute on chronic congestive heart failure, unspecified congestive heart failure type [facility])   Patient has history of atrial fibrillation. He presented to the emergency room tonight with A. fib RVR.  Patient has responded to a Cardizem drip. Heart rate at the bedside was around 112.  Chest x-ray does show a component of congestive heart failure most likely related to his A. fib. I have ordered a dose of Lasix. We will need to monitor his renal function as his BUN and creatinine are elevated.  Plan On admission to the hospital to a monitored unit.   I personally performed the services described in this documentation, which was scribed in my presence.  The recorded information has been reviewed and is accurate.    Linwood Dibbles, MD 08/30/15 0005

## 2015-08-29 NOTE — ED Notes (Signed)
Pt states he started feeling sob x 1 hour.  Pt denies cp

## 2015-08-30 ENCOUNTER — Inpatient Hospital Stay (HOSPITAL_COMMUNITY): Payer: PPO

## 2015-08-30 ENCOUNTER — Encounter (HOSPITAL_COMMUNITY): Payer: Self-pay | Admitting: *Deleted

## 2015-08-30 DIAGNOSIS — R7989 Other specified abnormal findings of blood chemistry: Secondary | ICD-10-CM | POA: Diagnosis not present

## 2015-08-30 DIAGNOSIS — Z23 Encounter for immunization: Secondary | ICD-10-CM | POA: Diagnosis not present

## 2015-08-30 DIAGNOSIS — Z820 Family history of epilepsy and other diseases of the nervous system: Secondary | ICD-10-CM | POA: Diagnosis not present

## 2015-08-30 DIAGNOSIS — I2510 Atherosclerotic heart disease of native coronary artery without angina pectoris: Secondary | ICD-10-CM

## 2015-08-30 DIAGNOSIS — E785 Hyperlipidemia, unspecified: Secondary | ICD-10-CM | POA: Diagnosis present

## 2015-08-30 DIAGNOSIS — Z7982 Long term (current) use of aspirin: Secondary | ICD-10-CM | POA: Diagnosis not present

## 2015-08-30 DIAGNOSIS — E1165 Type 2 diabetes mellitus with hyperglycemia: Secondary | ICD-10-CM | POA: Diagnosis present

## 2015-08-30 DIAGNOSIS — I509 Heart failure, unspecified: Secondary | ICD-10-CM | POA: Diagnosis not present

## 2015-08-30 DIAGNOSIS — I110 Hypertensive heart disease with heart failure: Secondary | ICD-10-CM | POA: Diagnosis present

## 2015-08-30 DIAGNOSIS — N179 Acute kidney failure, unspecified: Secondary | ICD-10-CM | POA: Diagnosis present

## 2015-08-30 DIAGNOSIS — I5043 Acute on chronic combined systolic (congestive) and diastolic (congestive) heart failure: Secondary | ICD-10-CM | POA: Diagnosis present

## 2015-08-30 DIAGNOSIS — Z8249 Family history of ischemic heart disease and other diseases of the circulatory system: Secondary | ICD-10-CM | POA: Diagnosis not present

## 2015-08-30 DIAGNOSIS — I252 Old myocardial infarction: Secondary | ICD-10-CM | POA: Diagnosis not present

## 2015-08-30 DIAGNOSIS — R778 Other specified abnormalities of plasma proteins: Secondary | ICD-10-CM | POA: Diagnosis present

## 2015-08-30 DIAGNOSIS — Z7984 Long term (current) use of oral hypoglycemic drugs: Secondary | ICD-10-CM | POA: Diagnosis not present

## 2015-08-30 DIAGNOSIS — I4891 Unspecified atrial fibrillation: Secondary | ICD-10-CM | POA: Diagnosis present

## 2015-08-30 DIAGNOSIS — Z951 Presence of aortocoronary bypass graft: Secondary | ICD-10-CM | POA: Diagnosis not present

## 2015-08-30 DIAGNOSIS — I214 Non-ST elevation (NSTEMI) myocardial infarction: Secondary | ICD-10-CM

## 2015-08-30 DIAGNOSIS — R002 Palpitations: Secondary | ICD-10-CM | POA: Diagnosis present

## 2015-08-30 DIAGNOSIS — R0600 Dyspnea, unspecified: Secondary | ICD-10-CM | POA: Diagnosis present

## 2015-08-30 DIAGNOSIS — Z7901 Long term (current) use of anticoagulants: Secondary | ICD-10-CM

## 2015-08-30 LAB — I-STAT TROPONIN, ED: Troponin i, poc: 0.1 ng/mL (ref 0.00–0.08)

## 2015-08-30 LAB — BASIC METABOLIC PANEL
ANION GAP: 16 — AB (ref 5–15)
BUN: 39 mg/dL — ABNORMAL HIGH (ref 6–20)
CHLORIDE: 97 mmol/L — AB (ref 101–111)
CO2: 23 mmol/L (ref 22–32)
Calcium: 9.9 mg/dL (ref 8.9–10.3)
Creatinine, Ser: 2.03 mg/dL — ABNORMAL HIGH (ref 0.61–1.24)
GFR calc non Af Amer: 30 mL/min — ABNORMAL LOW (ref >60)
GFR, EST AFRICAN AMERICAN: 35 mL/min — AB (ref >60)
Glucose, Bld: 419 mg/dL — ABNORMAL HIGH (ref 65–99)
POTASSIUM: 4 mmol/L (ref 3.5–5.1)
SODIUM: 136 mmol/L (ref 135–145)

## 2015-08-30 LAB — CBC
HEMATOCRIT: 45.2 % (ref 39.0–52.0)
HEMOGLOBIN: 15.4 g/dL (ref 13.0–17.0)
MCH: 31 pg (ref 26.0–34.0)
MCHC: 34.1 g/dL (ref 30.0–36.0)
MCV: 90.9 fL (ref 78.0–100.0)
PLATELETS: 255 10*3/uL (ref 150–400)
RBC: 4.97 MIL/uL (ref 4.22–5.81)
RDW: 13.1 % (ref 11.5–15.5)
WBC: 19.1 10*3/uL — AB (ref 4.0–10.5)

## 2015-08-30 LAB — TSH: TSH: 4.145 u[IU]/mL (ref 0.350–4.500)

## 2015-08-30 LAB — LIPID PANEL
Cholesterol: 245 mg/dL — ABNORMAL HIGH (ref 0–200)
HDL: 38 mg/dL — AB (ref >40)
LDL CALC: 152 mg/dL — AB (ref 0–99)
TRIGLYCERIDES: 275 mg/dL — AB (ref <150)
Total CHOL/HDL Ratio: 6.4 RATIO
VLDL: 55 mg/dL — AB (ref 0–40)

## 2015-08-30 LAB — TROPONIN I
Troponin I: 1.21 ng/mL (ref <0.031)
Troponin I: 7.87 ng/mL (ref <0.031)
Troponin I: 9.11 ng/mL (ref <0.031)
Troponin I: 6.59 ng/mL (ref <0.031)

## 2015-08-30 LAB — MRSA PCR SCREENING: MRSA by PCR: NEGATIVE

## 2015-08-30 LAB — GLUCOSE, CAPILLARY
GLUCOSE-CAPILLARY: 198 mg/dL — AB (ref 65–99)
Glucose-Capillary: 426 mg/dL — ABNORMAL HIGH (ref 65–99)
Glucose-Capillary: 304 mg/dL — ABNORMAL HIGH (ref 65–99)
Glucose-Capillary: 199 mg/dL — ABNORMAL HIGH (ref 65–99)
Glucose-Capillary: 163 mg/dL — ABNORMAL HIGH (ref 65–99)

## 2015-08-30 MED ORDER — LISINOPRIL 10 MG PO TABS
10.0000 mg | ORAL_TABLET | Freq: Every day | ORAL | Status: DC
  Administered 2015-08-30 – 2015-08-31 (×2): 10 mg via ORAL
  Filled 2015-08-30 (×2): qty 1

## 2015-08-30 MED ORDER — DILTIAZEM HCL ER COATED BEADS 180 MG PO CP24
360.0000 mg | ORAL_CAPSULE | Freq: Every day | ORAL | Status: DC
  Administered 2015-08-30 – 2015-08-31 (×2): 360 mg via ORAL
  Filled 2015-08-30 (×2): qty 2

## 2015-08-30 MED ORDER — CALCIUM CARBONATE-VITAMIN D 500-200 MG-UNIT PO TABS
1.0000 | ORAL_TABLET | Freq: Every day | ORAL | Status: DC
  Administered 2015-08-31: 1 via ORAL
  Filled 2015-08-30 (×4): qty 1

## 2015-08-30 MED ORDER — ONDANSETRON HCL 4 MG/2ML IJ SOLN: 4.0000 mg | Freq: Four times a day (QID) | INTRAMUSCULAR | Status: DC | PRN

## 2015-08-30 MED ORDER — ADULT MULTIVITAMIN W/MINERALS CH
1.0000 | ORAL_TABLET | Freq: Every day | ORAL | Status: DC
  Administered 2015-08-30 – 2015-08-31 (×2): 1 via ORAL
  Filled 2015-08-30 (×2): qty 1

## 2015-08-30 MED ORDER — METOPROLOL SUCCINATE ER 50 MG PO TB24
50.0000 mg | ORAL_TABLET | Freq: Two times a day (BID) | ORAL | Status: DC
  Administered 2015-08-30 – 2015-08-31 (×4): 50 mg via ORAL
  Filled 2015-08-30 (×4): qty 1

## 2015-08-30 MED ORDER — INSULIN ASPART 100 UNIT/ML ~~LOC~~ SOLN
0.0000 [IU] | Freq: Three times a day (TID) | SUBCUTANEOUS | Status: DC
  Administered 2015-08-30: 7 [IU] via SUBCUTANEOUS
  Administered 2015-08-30 (×2): 2 [IU] via SUBCUTANEOUS
  Administered 2015-08-31: 3 [IU] via SUBCUTANEOUS

## 2015-08-30 MED ORDER — APIXABAN 5 MG PO TABS
5.0000 mg | ORAL_TABLET | Freq: Two times a day (BID) | ORAL | Status: DC
  Administered 2015-08-30 – 2015-08-31 (×3): 5 mg via ORAL
  Filled 2015-08-30 (×3): qty 1

## 2015-08-30 MED ORDER — ACETAMINOPHEN 325 MG PO TABS: 650.0000 mg | ORAL_TABLET | ORAL | Status: DC | PRN

## 2015-08-30 MED ORDER — INFLUENZA VAC SPLIT QUAD 0.5 ML IM SUSY
0.5000 mL | PREFILLED_SYRINGE | INTRAMUSCULAR | Status: AC
  Administered 2015-08-31: 0.5 mL via INTRAMUSCULAR
  Filled 2015-08-30: qty 0.5

## 2015-08-30 MED ORDER — DILTIAZEM HCL ER COATED BEADS 120 MG PO TB24
360.0000 mg | ORAL_TABLET | Freq: Every day | ORAL | Status: DC
  Filled 2015-08-30: qty 3

## 2015-08-30 MED ORDER — INSULIN ASPART 100 UNIT/ML ~~LOC~~ SOLN
12.0000 [IU] | Freq: Once | SUBCUTANEOUS | Status: AC
  Administered 2015-08-30: 12 [IU] via SUBCUTANEOUS

## 2015-08-30 MED ORDER — INSULIN ASPART 100 UNIT/ML ~~LOC~~ SOLN
8.0000 [IU] | Freq: Once | SUBCUTANEOUS | Status: AC
  Administered 2015-08-30: 8 [IU] via SUBCUTANEOUS
  Filled 2015-08-30: qty 1

## 2015-08-30 MED ORDER — ALPRAZOLAM 0.5 MG PO TABS
0.5000 mg | ORAL_TABLET | Freq: Every day | ORAL | Status: DC | PRN
  Administered 2015-08-30: 0.5 mg via ORAL
  Filled 2015-08-30: qty 1

## 2015-08-30 MED ORDER — FUROSEMIDE 10 MG/ML IJ SOLN
40.0000 mg | Freq: Two times a day (BID) | INTRAMUSCULAR | Status: DC
  Administered 2015-08-30 – 2015-08-31 (×3): 40 mg via INTRAVENOUS
  Filled 2015-08-30 (×3): qty 4

## 2015-08-30 MED ORDER — ASPIRIN 81 MG PO CHEW
81.0000 mg | CHEWABLE_TABLET | Freq: Every day | ORAL | Status: DC
  Administered 2015-08-30 – 2015-08-31 (×2): 81 mg via ORAL
  Filled 2015-08-30 (×2): qty 1

## 2015-08-30 MED ORDER — PRAVASTATIN SODIUM 40 MG PO TABS
40.0000 mg | ORAL_TABLET | Freq: Every day | ORAL | Status: DC
Start: 2015-08-30 — End: 2015-08-31
  Administered 2015-08-30: 40 mg via ORAL
  Filled 2015-08-30: qty 1

## 2015-08-30 MED ORDER — POTASSIUM CHLORIDE CRYS ER 20 MEQ PO TBCR
40.0000 meq | EXTENDED_RELEASE_TABLET | Freq: Every day | ORAL | Status: DC
  Administered 2015-08-30 – 2015-08-31 (×2): 40 meq via ORAL
  Filled 2015-08-30 (×2): qty 2

## 2015-08-30 NOTE — Care Management Note (Addendum)
Case Management Note  Patient Details  Name: Randy Nunez MRN: [id number] Date of Birth: 03-22-1941  Subjective/Objective:                  Pt is from home, lives alone and is ind with ADL's at baseline. Pt has no DME or HH services prior to admission. Pt plans to return home with self care. Pt is anxious to leave due to financial reasons, FC asked to come speak with pt. Pt will need PT eval and home O2 assessment prior to DC, not ready at this time.   Action/Plan: Will cont to follow for DC planning.   Expected Discharge Date:  09/02/15               Expected Discharge Plan:  Home/Self Care  In-House Referral:  Financial Counselor  Discharge planning Services  CM Consult  Post Acute Care Choice:  NA Choice offered to:  NA  DME Arranged:    DME Agency:     HH Arranged:    HH Agency:     Status of Service:  Completed, signed off  Medicare Important Message Given:    Date Medicare IM Given:    Medicare IM give by:    Date Additional Medicare IM Given:    Additional Medicare Important Message give by:     If discussed at Long Length of Stay Meetings, dates discussed:    Additional Comments:  Malcolm Metro, RN 08/30/2015, 1:26 PM

## 2015-08-30 NOTE — Progress Notes (Signed)
Patient seen and chart reviewed. Admitted earlier today with SOB and palpitations. Found to be in a fib with RVR and with acute CHF. His troponins have subsequently risen to 7. He wants medical management only and refuses transfer to [facility] or cath. Seen by cards. Plan to start PO cardizem and transition off drip. Continue diuresis. If stable, consider DC next 24-48 hours. ECHO pending.  Peggye Pitt, MD [facility] Pager: [phone]

## 2015-08-30 NOTE — Progress Notes (Signed)
Inpatient Diabetes Program Recommendations  AACE/ADA: New Consensus Statement on Inpatient Glycemic Control (2015)  Target Ranges:  Prepandial:   less than 140 mg/dL      Peak postprandial:   less than 180 mg/dL (1-2 hours)      Critically ill patients:  140 - 180 mg/dL  Results for TEODULO, FONTENETTE (MRN [id number]) as of 08/30/2015 07:31  Ref. Range 08/30/2015 03:03  Glucose-Capillary Latest Ref Range: 65-99 mg/dL 383 (H)  Results for COLSON, MARCILLE (MRN [id number]) as of 08/30/2015 07:31  Ref. Range 08/30/2015 02:45  Glucose Latest Ref Range: 65-99 mg/dL 360 (H)   Review of Glycemic Control  Diabetes history: DM2 Outpatient Diabetes medications: MetaGlip 5-500 mg BID Current orders for Inpatient glycemic control: Novolog 0-9 units TID with meals  Inpatient Diabetes Program Recommendations: Insulin - Basal: Initial glucose was 411 mg/dl on 6/77/03 at 40:35 and patient received Novolog 8 units at 00:55 on 08/30/15. Glucose was 426 mg/dl at 2:48 am and patient received Novolog 12 units at 3:12 am today. Please order low dose basal insulin; recommend starting with Levemir 10 units now (based on 95.9 kg x 0.1 units). Correction (SSI): Please consider increasing Novolog to moderate scale and change frequency to Q4H for closer glycemic monitoring. HgbA1C: A1C in process.  Thanks, Orlando Penner, RN, MSN, CDE Diabetes Coordinator Inpatient Diabetes Program [phone] (Team Pager from 8am to 5pm) [phone] ([facility] office) [phone] [facility] office) [phone] [facility] office)

## 2015-08-30 NOTE — Care Management Important Message (Signed)
Important Message  Patient Details  Name: Randy Nunez MRN: [id number] Date of Birth: 1941-04-27   Medicare Important Message Given:  Yes    Malcolm Metro, RN 08/30/2015, 1:40 PM

## 2015-08-30 NOTE — H&P (Signed)
PCP:   [name], MD   Chief Complaint:  Sob, fast heart rate  HPI: 75 yo male h/o chf, afib, htn, CAD comes in today to the ED with sob and fast heart rate.  Pt says he carried a casket today for a funeral, he was one of 6 pallbearers and he says it was way too much for him.  Soon after the ceremony he got sob and knew he had done too much.  No recent illnesses.  No fevers, no chest pain.  No cough.  No n/v/d.  No swelling.  On arrival to ED was found to be in afib with rvr along with chf flare.  He is feeling much better after rate controlling drug given along with a diuretic.  He is ready to go home now.  Pt referred for admission for afib rate uncontrolled along with chf exacerbation.  Review of Systems:  Positive and negative as per HPI otherwise all other systems are negative  Past Medical History: Past Medical History  Diagnosis Date  . Diabetes mellitus without complication (HCC)     x  8 yrs.  . Hypertension   . CAD (coronary artery disease)     a. s/p CABG 2001. b. NSTEMI 08/2013 - cath done, med rx recommended.  . Atrial fibrillation (HCC)     a. Dx 08/2013. Placed on eliquis. Plan DCCV as outpt. Also has had WCT c/w aberrancy.  . Chronic combined systolic and diastolic CHF (congestive heart failure) (HCC)   . Acute respiratory failure (HCC)     a. 08/2013 - VDRF due to pulm edema.   Past Surgical History  Procedure Laterality Date  . Left heart cath  2015    Dr Allyson Sabal, with stenting  . Coronary artery bypass graft  2001  . Left heart catheterization with coronary angiogram N/A 08/21/2013    Procedure: LEFT HEART CATHETERIZATION WITH CORONARY ANGIOGRAM;  Surgeon: Runell Gess, MD;  Location: [facility] CATH LAB;  Service: Cardiovascular;  Laterality: N/A;    Medications: Prior to Admission medications   Medication Sig Start Date End Date Taking? Authorizing Provider  ALPRAZolam Prudy Feeler) 0.5 MG tablet Take 0.5 mg by mouth daily as needed for anxiety.    Historical Provider, MD   apixaban (ELIQUIS) 5 MG TABS tablet TAKE (1) TABLET TWICE DAILY. 10/01/14   Lars Masson, MD  aspirin 81 MG chewable tablet Chew 1 tablet (81 mg total) by mouth daily. 08/23/13   Lonia Blood, MD  calcium-vitamin D (OSCAL WITH D) 500-200 MG-UNIT per tablet Take 1 tablet by mouth daily.    Historical Provider, MD  diltiazem (CARDIZEM LA) 120 MG 24 hr tablet Take 3 tablets (360 mg total) by mouth daily. 10/01/14   Lars Masson, MD  furosemide (LASIX) 40 MG tablet TAKE 80 MG (2 TABS) IN THE MORNING AND 40 MG (1 TAB) IN THE EVENING 10/01/14   Lars Masson, MD  glipiZIDE-metformin (METAGLIP) 5-500 MG per tablet Take 2 tablets by mouth 2 (two) times daily.    Historical Provider, MD  lisinopril (PRINIVIL,ZESTRIL) 10 MG tablet Take 1 tablet (10 mg total) by mouth daily. 10/01/14   Lars Masson, MD  metoprolol succinate (TOPROL-XL) 50 MG 24 hr tablet Take 1 tablet (50 mg total) by mouth 2 (two) times daily. Take with or immediately following a meal. 10/01/14   Lars Masson, MD  Multiple Vitamin (MULTIVITAMIN WITH MINERALS) TABS tablet Take 1 tablet by mouth daily.    Historical  Provider, MD  potassium chloride SA (K-DUR,KLOR-CON) 20 MEQ tablet Take 2 tablets (40 mEq total) by mouth daily. 10/01/14   Lars Masson, MD  pravastatin (PRAVACHOL) 40 MG tablet Take 1 tablet (40 mg total) by mouth at bedtime. 10/01/14   Lars Masson, MD    Allergies:   Allergies  Allergen Reactions  . Penicillins     Social History:  reports that he has never smoked. He does not have any smokeless tobacco history on file. He reports that he does not drink alcohol or use illicit drugs.  Family History: Family History  Problem Relation Age of Onset  . Hypertension    . Hyperlipidemia    . Alzheimer's disease Mother   . Heart attack Father     Physical Exam: Filed Vitals:   08/29/15 2317 08/29/15 2330 08/30/15 0000 08/30/15 0030  BP: 147/97 116/67 134/73 143/92  Pulse: 143 69 116 38   Temp:      TempSrc:      Resp: 20 16 21 19   SpO2: 92% 90% 89% 97%   General appearance: alert, cooperative and no distress Head: Normocephalic, without obvious abnormality, atraumatic Eyes: negative Nose: Nares normal. Septum midline. Mucosa normal. No drainage or sinus tenderness. Neck: no JVD and supple, symmetrical, trachea midline Lungs: crackles at bases Heart: irregularly irregular rhythm Abdomen: soft, non-tender; bowel sounds normal; no masses,  no organomegaly Extremities: extremities normal, atraumatic, no cyanosis or edema Pulses: 2+ and symmetric Skin: Skin color, texture, turgor normal. No rashes or lesions Neurologic: Grossly normal   Labs on Admission:   Recent Labs  08/29/15 2230  NA 137  K 3.6  CL 95*  CO2 25  GLUCOSE 411*  BUN 37*  CREATININE 2.32*  CALCIUM 9.7  MG 2.0    Recent Labs  08/29/15 2230  WBC 21.6*  HGB 16.4  HCT 47.9  MCV 92.3  PLT 314   Radiological Exams on Admission: Dg Chest Port 1 View  08/29/2015  CLINICAL DATA:  Shortness of breath beginning approximately 1 hour ago. EXAM: PORTABLE CHEST 1 VIEW COMPARISON:  Single view of the chest 08/28/2013. FINDINGS: There is cardiomegaly. Increased airspace opacities are present in the mid and lower lung zones bilaterally likely due to pulmonary edema. No pneumothorax or pleural effusion. IMPRESSION: Findings most consistent with congestive heart failure. Electronically Signed   By: Drusilla Kanner M.D.   On: 08/29/2015 22:41   cxr reviewed edema ekg reviewed afib with rvr no acute issues Old chart reviewed  Assessment/Plan  75 yo male with rapid afib with chf exacerbation likely due to overexertion today  Principal Problem:   Acute on chronic combined systolic and diastolic CHF (congestive heart failure) (HCC)- likely secondary to uncontrolled rate.  Given lasix 40mg  iv in ed, taking 80mg  po in am and 40mg  in pm.  Will schedule 40mg  iv q 12 hours for now, and see how his kidney  function does.  Just had echo this month, will not repeat.  Active Problems:   AKI (acute kidney injury) (HCC)-vs CKD??  Last one here was a year ago and was normal.  So unknown what has been happeneing in the last year.  May be helpful to get records from PCP tomorrow.     Atrial fibrillation with rapid ventricular response (HCC)- diltiazem gtt.  Give full dose of po at 8am instead of 10am and wean drip.     Elevated troponin- noted, likely from demand ischemia.  Continue to serial troponin til peaks.  On eloquis.   CAD (coronary artery disease)- noted   Diabetes mellitus, type 2 (HCC)- uncontrolled, place on ssi.  Check hga1c   Hyperlipidemia- noted, check flp in am   Essential hypertension- noted   Dyspnea- due to above, chf and afib   Anticoagulant long-term use- noted, continue eloquis  Admit to stepdown.  Full code.  Caylie Sandquist A 08/30/2015, 1:04 AM

## 2015-08-30 NOTE — Consult Note (Signed)
CARDIOLOGY CONSULT NOTE   Patient ID: Randy Nunez MRN: [id number] DOB/AGE: 77-Aug-1942 77 y.o.  Admit Date: 08/29/2015 Referring Physician: TRH-Hernandez MD Primary Physician: [name], MD Consulting Cardiologist: Randy Haws MD Primary Cardiologist: Andreas Ohm MD Reason for Consultation: Positive troponin, Atrial fib and CHF  Clinical Summary Mr. Randy Nunez is a 75 y.o.male with known history of CAD, S/P CABG 2001, repeat cath in 2015 with medical therapy recommended, atrial fib, CHADS VASC Score 5, on Eliquis, hypertension, and diabetes. He was asked to help carry a casket for a funeral. He states it was very heavy. When he got home, he began to feel his heart racing and he became dyspneic.   On arrival to ER, BP 197/117, HR 157 bpm, atrial fib, O2 Sat 92%. He states that he did not take his metoprolol yesterday but normally takes it every day. He denied symptoms of edema, chest pain or dyspnea prior to this event. Initial troponon 0.10, with subsequent troponin 1.21. And 7.83 respectively. WBC were elevated at 21.6, glucose 411, creatinine 2.32. BNP 304. CXR CHF.  He was treated with duoneb neb tx. Given IV diltiazem bolus 15 mg, and began on a gtt. He was given IV lasix and insulin  He has diuresed 2.725 overnight.   He is now in rate controlled atrial fib. He is feeling better and wants to go home. He has been transitioned to po diltiazem 360 mg tablet. He remains on IV lasix 40 mg BID.   Allergies  Allergen Reactions  . Penicillins     Medications Scheduled Medications: . apixaban  5 mg Oral BID  . aspirin  81 mg Oral Daily  . calcium-vitamin D  1 tablet Oral Daily  . diltiazem  360 mg Oral Daily  . furosemide  40 mg Intravenous Q12H  . [START ON 08/31/2015] Influenza vac split quadrivalent PF  0.5 mL Intramuscular Tomorrow-1000  . insulin aspart  0-9 Units Subcutaneous TID WC  . lisinopril  10 mg Oral Daily  . metoprolol succinate  50 mg Oral BID  . multivitamin with  minerals  1 tablet Oral Daily  . potassium chloride SA  40 mEq Oral Daily  . pravastatin  40 mg Oral QHS    Infusions: . diltiazem (CARDIZEM) infusion Stopped (08/30/15 0912)    PRN Medications: acetaminophen, ALPRAZolam, ondansetron (ZOFRAN) IV   Past Medical History  Diagnosis Date  . Diabetes mellitus without complication (HCC)     x  8 yrs.  . Hypertension   . CAD (coronary artery disease)     a. s/p CABG 2001. b. NSTEMI 08/2013 - cath done, med rx recommended.  . Atrial fibrillation (HCC)     a. Dx 08/2013. Placed on eliquis. Plan DCCV as outpt. Also has had WCT c/w aberrancy.  . Chronic combined systolic and diastolic CHF (congestive heart failure) (HCC)   . Acute respiratory failure (HCC)     a. 08/2013 - VDRF due to pulm edema.    Past Surgical History  Procedure Laterality Date  . Left heart cath  2015    Dr Allyson Sabal, with stenting  . Coronary artery bypass graft  2001  . Left heart catheterization with coronary angiogram N/A 08/21/2013    Procedure: LEFT HEART CATHETERIZATION WITH CORONARY ANGIOGRAM;  Surgeon: Runell Gess, MD;  Location: [facility] CATH LAB;  Service: Cardiovascular;  Laterality: N/A;    Family History  Problem Relation Age of Onset  . Hypertension    . Hyperlipidemia    .  Alzheimer's disease Mother   . Heart attack Father     Social History Mr. Randy Nunez reports that he has never smoked. He does not have any smokeless tobacco history on file. Mr. Randy Nunez reports that he does not drink alcohol.  Review of Systems Complete review of systems are found to be negative unless outlined in H&P above.  Physical Examination Blood pressure 116/98, pulse 78, temperature 98 F (36.7 C), temperature source Oral, resp. rate 21, height  (1.803 m), weight 211 lb 6.7 oz (95.9 kg), SpO2 96 %.  Intake/Output Summary (Last 24 hours) at 08/30/15 1028 Last data filed at 08/30/15 0612  Gross per 24 hour  Intake     75 ml  Output   2800 ml  Net  -2725 ml     Telemetry: Atrial fib with slow ventricular response.   GEN: No acute distress HEENT: Conjunctiva and lids normal, oropharynx clear with moist mucosa. Neck: Supple, no elevated JVP or carotid bruits, no thyromegaly. Lungs: Bilateral crackles without wheezes. On O2.  Cardiac: Iregular rate and rhythm, no S3 or significant systolic murmur, no pericardial rub. Abdomen: Mildly distended, nontender, no hepatomegaly, bowel sounds present, no guarding or rebound. Extremities: No pitting edema, distal pulses 2+. Skin: Warm and dry. Musculoskeletal: No kyphosis. Neuropsychiatric: Alert and oriented x3, affect grossly appropriate.  Prior Cardiac Testing/Procedures 1. Echocardiogram 08/18/2013 Left ventricle: The cavity size was mildly dilated. Wall thickness was increased in a pattern of mild LVH. Systolic function was mildly to moderately reduced. The estimated ejection fraction was in the range of 40% to 45%. Wall motion was normal; there were no regional wall motion abnormalities. - Left atrium: The atrium was moderately dilated. - Right ventricle: The cavity size was moderately dilated. Wall thickness was normal. Systolic function was moderately reduced. - Right atrium: The atrium was moderately dilated. - Pulmonary arteries: Systolic pressure was mildly increased. PA peak pressure: 39mm Hg (S).  2. Cardiac Cath: 08/21/2013 Final Conclusions:  1. Severe native three-vessel coronary artery disease 2. Status post aortocoronary bypass surgery with continued patency of the LIMA to LAD, saphenous vein graft to PDA, and sequential saphenous vein graft to OM1 and OM 2. 3. Total occlusion of the saphenous vein graft to diagonal 4. Moderate LV systolic dysfunction  Lab Results  Basic Metabolic Panel:  Recent Labs Lab 08/29/15 2230 08/30/15 0245  NA 137 136  K 3.6 4.0  CL 95* 97*  CO2 25 23  GLUCOSE 411* 419*  BUN 37* 39*  CREATININE 2.32* 2.03*  CALCIUM 9.7  9.9  MG 2.0  --      CBC:  Recent Labs Lab 08/29/15 2230 08/30/15 0245  WBC 21.6* 19.1*  HGB 16.4 15.4  HCT 47.9 45.2  MCV 92.3 90.9  PLT 314 255    Cardiac Enzymes:  Recent Labs Lab 08/30/15 0245 08/30/15 0849  TROPONINI 1.21* 7.87*    Radiology: Dg Chest Port 1 View  08/29/2015  CLINICAL DATA:  Shortness of breath beginning approximately 1 hour ago. EXAM: PORTABLE CHEST 1 VIEW COMPARISON:  Single view of the chest 08/28/2013. FINDINGS: There is cardiomegaly. Increased airspace opacities are present in the mid and lower lung zones bilaterally likely due to pulmonary edema. No pneumothorax or pleural effusion. IMPRESSION: Findings most consistent with congestive heart failure. Electronically Signed   By: Drusilla Kanner M.D.   On: 08/29/2015 22:41     ZOX:WRUEAV fib with RVR rate of 156 bpm.   Impression and Recommendations  1.Acute MI: Repeating EKG  and echo. Troponin will be cycled to observe for trends. Continue Elilquis and BB. May need to adjust as he is bradycardic. Patient does not want any invasive procedure done. He was told during cath in 2015 to be treated medically, but now, he states that he cannot afford more bills. Case Worker is speaking to him.   2. CAD: Hx of CABG (LIMA to LAD, SVG to PDA, SVG to OM1 and OM 2. CTO of SVG to diagonal per cardiac cath in 2015. He did not take his metoprolol day of event but normally is very compliant.Continue BB, Statin, ACE, ASA.   3. Systolic CHF: Continue diureses with IV lasix. He still has evidence of decompensation. Follow BMET and I/O Echo will assist with management.   4. Atrial fib: Now rate controlled. He remains on diltiazem and metoprolol. HR in the 60's,   5. Diabetes: Per TRH.    Signed: Bettey Mare. Lawrence NP AACC  08/30/2015, 10:28 AM Co-Sign MD  Patient examined chart reviewed.  Patient wants to go home. Think we convinced him to stay at least 24 hrs to get better rate control of afib and diurese  a bit more. On Eliquis.  Lungs improved on exam and only trace LE edema Despite troponin he clearly indicates not wanting to be transferred to [facility] and cathed.  No chest pain other than afib ECG with no acute ST elevation.  Echo pending  Randy Nunez

## 2015-08-30 NOTE — Progress Notes (Signed)
Pt had only 725 output after getting lasix twice today.  Urine malodorous.    08/30/15 1700  Urine Characteristics  Urinary Incontinence No  Urine Color Yellow/straw  Urine Appearance Clear  Urine Odor Malodorous

## 2015-08-30 NOTE — Progress Notes (Signed)
Trop increased to 1.21 MD made aware via text page Lab call at 0330 MD paged [phone]

## 2015-08-31 DIAGNOSIS — I4891 Unspecified atrial fibrillation: Secondary | ICD-10-CM | POA: Diagnosis not present

## 2015-08-31 LAB — BASIC METABOLIC PANEL
Anion gap: 11 (ref 5–15)
BUN: 38 mg/dL — AB (ref 6–20)
CALCIUM: 8.8 mg/dL — AB (ref 8.9–10.3)
CO2: 24 mmol/L (ref 22–32)
CREATININE: 1.54 mg/dL — AB (ref 0.61–1.24)
Chloride: 101 mmol/L (ref 101–111)
GFR calc non Af Amer: 42 mL/min — ABNORMAL LOW (ref >60)
GFR, EST AFRICAN AMERICAN: 49 mL/min — AB (ref >60)
Glucose, Bld: 189 mg/dL — ABNORMAL HIGH (ref 65–99)
Potassium: 3.9 mmol/L (ref 3.5–5.1)
Sodium: 136 mmol/L (ref 135–145)

## 2015-08-31 LAB — TROPONIN I: Troponin I: 3.94 ng/mL (ref <0.031)

## 2015-08-31 LAB — CBC
HEMATOCRIT: 38.9 % — AB (ref 39.0–52.0)
Hemoglobin: 13.3 g/dL (ref 13.0–17.0)
MCH: 31 pg (ref 26.0–34.0)
MCHC: 34.2 g/dL (ref 30.0–36.0)
MCV: 90.7 fL (ref 78.0–100.0)
Platelets: 218 10*3/uL (ref 150–400)
RBC: 4.29 MIL/uL (ref 4.22–5.81)
RDW: 13.2 % (ref 11.5–15.5)
WBC: 14.5 10*3/uL — ABNORMAL HIGH (ref 4.0–10.5)

## 2015-08-31 LAB — HEMOGLOBIN A1C
HEMOGLOBIN A1C: 9.5 % — AB (ref 4.8–5.6)
Mean Plasma Glucose: 226 mg/dL

## 2015-08-31 LAB — GLUCOSE, CAPILLARY: Glucose-Capillary: 225 mg/dL — ABNORMAL HIGH (ref 65–99)

## 2015-08-31 NOTE — Progress Notes (Signed)
Alert and oriented. Vital signs stable. Saline lock removed. Telemetry D/C'ed. Discharge instructions given. No new prescriptions ordered. Patient verbalized understanding of instructions. Left floor via wheelchair with nursing staff and church members.  Discharged home.  Heather Roberts 08/31/2015 11:26 AM

## 2015-08-31 NOTE — Discharge Summary (Signed)
Physician Discharge Summary  Randy Nunez ZOX:[id number] DOB: September 04, 1940 DOA: 08/29/2015  PCP: [name], MD  Admit date: 08/29/2015 Discharge date: 08/31/2015  Time spent: 45 minutes  Recommendations for Outpatient Follow-up:  -Will be discharged home today. -Advised to follow up with PCP in 2 weeks.   Discharge Diagnoses:  Principal Problem:   Acute on chronic combined systolic and diastolic CHF (congestive heart failure) (HCC) Active Problems:   CAD (coronary artery disease)   Diabetes mellitus, type 2 (HCC)   Hyperlipidemia   Essential hypertension   AKI (acute kidney injury) (HCC)   Atrial fibrillation with rapid ventricular response (HCC)   Dyspnea   Anticoagulant long-term use   Elevated troponin   Acute on chronic congestive heart failure (HCC)   NSTEMI (non-ST elevated myocardial infarction) [facility])   Discharge Condition: Stable and improved  Filed Weights   08/30/15 0138 08/31/15 0400  Weight: 95.9 kg (211 lb 6.7 oz) 94.4 kg (208 lb 1.8 oz)    History of present illness:  As per Dr. Onalee Hua 1/27: 75 yo male h/o chf, afib, htn, CAD comes in today to the ED with sob and fast heart rate. Pt says he carried a casket today for a funeral, he was one of 6 pallbearers and he says it was way too much for him. Soon after the ceremony he got sob and knew he had done too much. No recent illnesses. No fevers, no chest pain. No cough. No n/v/d. No swelling. On arrival to ED was found to be in afib with rvr along with chf flare. He is feeling much better after rate controlling drug given along with a diuretic. He is ready to go home now. Pt referred for admission for afib rate uncontrolled along with chf exacerbation  Hospital Course:   NSTEMI -With peak troponin of 7.8. -No EKG changes. -patient refused transfer to [facility] or cath. -Anticoagulated on eliquis. -Appreciate cards input and recommendations.  CAD -Hx of CABG (LIMA to LAD, SVG to PDA, SVG to OM1 and OM 2.  CTO of SVG to diagonal per cardiac cath in 2015. -Continue BB, ACE-I, ASA, statin.  Acute on Chronic Systolic CHF -Volume status appears compensated. -He has diuresed 3.1 L since admission. -  A Fib -rate controlled. -Back on home dose of diltiazem.  DM -Fair control.  Procedures:  None   Consultations:  Cardiology  Discharge Instructions  Discharge Instructions    Diet - low sodium heart healthy    Complete by:  As directed      Increase activity slowly    Complete by:  As directed             Medication List    TAKE these medications        ALPRAZolam 0.5 MG tablet  Commonly known as:  XANAX  Take 0.5 mg by mouth daily as needed for anxiety.     apixaban 5 MG Tabs tablet  Commonly known as:  ELIQUIS  TAKE (1) TABLET TWICE DAILY.     aspirin 81 MG chewable tablet  Chew 1 tablet (81 mg total) by mouth daily.     calcium-vitamin D 500-200 MG-UNIT tablet  Commonly known as:  OSCAL WITH D  Take 1 tablet by mouth daily.     diltiazem 120 MG 24 hr tablet  Commonly known as:  CARDIZEM LA  Take 3 tablets (360 mg total) by mouth daily.     furosemide 40 MG tablet  Commonly known as:  LASIX  TAKE 80 MG (2 TABS) IN THE MORNING AND 40 MG (1 TAB) IN THE EVENING     glipiZIDE-metformin 5-500 MG tablet  Commonly known as:  METAGLIP  Take 2 tablets by mouth 2 (two) times daily.     lisinopril 10 MG tablet  Commonly known as:  PRINIVIL,ZESTRIL  Take 1 tablet (10 mg total) by mouth daily.     metoprolol succinate 50 MG 24 hr tablet  Commonly known as:  TOPROL-XL  Take 1 tablet (50 mg total) by mouth 2 (two) times daily. Take with or immediately following a meal.     multivitamin with minerals Tabs tablet  Take 1 tablet by mouth daily.     potassium chloride SA 20 MEQ tablet  Commonly known as:  K-DUR,KLOR-CON  Take 2 tablets (40 mEq total) by mouth daily.     pravastatin 40 MG tablet  Commonly known as:  PRAVACHOL  Take 1 tablet (40 mg total) by mouth  at bedtime.       Allergies  Allergen Reactions  . Penicillins        Follow-up Information    Follow up with [name], MD. Schedule an appointment as soon as possible for a visit in 2 weeks.   Specialty:  Internal Medicine   Contact information:   [address] [place] [address] [phone]        The results of significant diagnostics from this hospitalization (including imaging, microbiology, ancillary and laboratory) are listed below for reference.    Significant Diagnostic Studies: Dg Chest Port 1 View  08/29/2015  CLINICAL DATA:  Shortness of breath beginning approximately 1 hour ago. EXAM: PORTABLE CHEST 1 VIEW COMPARISON:  Single view of the chest 08/28/2013. FINDINGS: There is cardiomegaly. Increased airspace opacities are present in the mid and lower lung zones bilaterally likely due to pulmonary edema. No pneumothorax or pleural effusion. IMPRESSION: Findings most consistent with congestive heart failure. Electronically Signed   By: Drusilla Kanner M.D.   On: 08/29/2015 22:41    Microbiology: Recent Results (from the past 240 hour(s))  MRSA PCR Screening     Status: None   Collection Time: 08/30/15  2:00 AM  Result Value Ref Range Status   MRSA by PCR NEGATIVE NEGATIVE Final    Comment:        The GeneXpert MRSA Assay (FDA approved for NASAL specimens only), is one component of a comprehensive MRSA colonization surveillance program. It is not intended to diagnose MRSA infection nor to guide or monitor treatment for MRSA infections.      Labs: Basic Metabolic Panel:  Recent Labs Lab 08/29/15 2230 08/30/15 0245 08/31/15 0401  NA 137 136 136  K 3.6 4.0 3.9  CL 95* 97* 101  CO2 25 23 24   GLUCOSE 411* 419* 189*  BUN 37* 39* 38*  CREATININE 2.32* 2.03* 1.54*  CALCIUM 9.7 9.9 8.8*  MG 2.0  --   --    Liver Function Tests: No results for input(s): AST, ALT, ALKPHOS, BILITOT, PROT, ALBUMIN in the last 168 hours. No results for input(s):  LIPASE, AMYLASE in the last 168 hours. No results for input(s): AMMONIA in the last 168 hours. CBC:  Recent Labs Lab 08/29/15 2230 08/30/15 0245 08/31/15 0401  WBC 21.6* 19.1* 14.5*  HGB 16.4 15.4 13.3  HCT 47.9 45.2 38.9*  MCV 92.3 90.9 90.7  PLT 314 255 218   Cardiac Enzymes:  Recent Labs Lab 08/30/15 0245 08/30/15 0849 08/30/15 1458 08/30/15  2053 08/31/15 0328  TROPONINI 1.21* 7.87* 9.11* 6.59* 3.94*   BNP: BNP (last 3 results)  Recent Labs  08/29/15 2230  BNP 304.0*    ProBNP (last 3 results) No results for input(s): PROBNP in the last 8760 hours.  CBG:  Recent Labs Lab 08/30/15 0736 08/30/15 1259 08/30/15 1638 08/30/15 2158 08/31/15 0734  GLUCAP 198* 304* 199* 163* 225*       Signed:  Chaya Jan  [facility] Pager: [phone] 08/31/2015, 10:55 AM

## 2015-09-10 ENCOUNTER — Other Ambulatory Visit: Payer: Self-pay | Admitting: Licensed Clinical Social Worker

## 2015-09-10 ENCOUNTER — Encounter: Payer: Self-pay | Admitting: Licensed Clinical Social Worker

## 2015-09-10 NOTE — Patient Outreach (Addendum)
Assessment:  CSW received referral on Randy Nunez on 09/10/15.  CSW completed chart review on client on 09/10/15.  Client was recently hospitalized from 08/29/15 to 08/31/15. After hospital discharge on 08/31/15,client returned to his home in [place], [place].  Client does see Dr. Catalina Nunez as his primary care physician. Telephonic [facility] RN Randy Nunez also received nursing referral on client.   CSW called home phone number for client on 09/10/15.  CSW spoke via phone with client on 09/10/15.  CSW verified client identity.  CSW introduced self to client.  Randy Nunez gave CSW verbal permission on 09/10/15 for CSW to speak with client about current needs of client.  CSW and client discussed client's recent hospitalization at [facility]. Client discharged home after hospitalization. Client said he has his prescribed medications and is taking medications as prescribed. He said he is eating adequately and sleeping well. He drives himself to needed medical appointments. He drove himself to a scheduled medical appointment for client on 09/10/15.  He has some support from his sister, Randy Nunez. CSW and client completed needed [facility] assessments.  CSW informed client that Randy Nunez, Case Management Assistant, would mail [facility] consent form to client for client to read and review.  Client said he would read and review [facility] consent form once he receives this consent form. He said he is walking well, Nunez dress and bath on his own, is able to prepare his meals, and is able to take his medications.  He said he does have a cataract on his right eye.  But ,he said he hears well and Nunez concentrate well. He denied any depression symptoms.  CSW and client spoke of client care plan. Client agreed to care plan goal that he would try to attend all scheduled client medical appointments in next 30 days.  CSW informed client of nursing, social work and pharmacy support services of [facility] program.  CSW encouraged client to call CSW at [phone]  to discuss social work needs of client. CSW thanked client for phone call with CSW on 09/10/15.   Plan:  Client to attend scheduled client medical appointments in next 30 days. CSW to collaborate with RN Randy Nunez as needed in monitoring needs of client.   CSW to call client in one week to further assess client needs at that time.  Randy Nunez.Randy Nunez MSW, LCSW Licensed Clinical Social Worker [facility] Care Management [phone]

## 2015-09-12 ENCOUNTER — Inpatient Hospital Stay (HOSPITAL_COMMUNITY)
Admission: EM | Admit: 2015-09-12 | Discharge: 2015-09-14 | DRG: 871 | Disposition: A | Discharge status: Home / Self Care | Payer: PPO | Attending: Internal Medicine | Admitting: Internal Medicine

## 2015-09-12 ENCOUNTER — Encounter (HOSPITAL_COMMUNITY): Payer: Self-pay

## 2015-09-12 ENCOUNTER — Other Ambulatory Visit: Payer: Self-pay

## 2015-09-12 ENCOUNTER — Emergency Department (HOSPITAL_COMMUNITY)
Admission: EM | Admit: 2015-09-12 | Discharge: 2015-09-12 | Disposition: A | Discharge status: Home / Self Care | Payer: Self-pay

## 2015-09-12 ENCOUNTER — Emergency Department (HOSPITAL_COMMUNITY): Payer: PPO

## 2015-09-12 ENCOUNTER — Inpatient Hospital Stay (HOSPITAL_COMMUNITY): Payer: PPO

## 2015-09-12 DIAGNOSIS — J9621 Acute and chronic respiratory failure with hypoxia: Secondary | ICD-10-CM | POA: Diagnosis present

## 2015-09-12 DIAGNOSIS — Z951 Presence of aortocoronary bypass graft: Secondary | ICD-10-CM

## 2015-09-12 DIAGNOSIS — Z66 Do not resuscitate: Secondary | ICD-10-CM | POA: Diagnosis present

## 2015-09-12 DIAGNOSIS — I4891 Unspecified atrial fibrillation: Secondary | ICD-10-CM | POA: Diagnosis present

## 2015-09-12 DIAGNOSIS — I2510 Atherosclerotic heart disease of native coronary artery without angina pectoris: Secondary | ICD-10-CM | POA: Diagnosis present

## 2015-09-12 DIAGNOSIS — E119 Type 2 diabetes mellitus without complications: Secondary | ICD-10-CM | POA: Diagnosis present

## 2015-09-12 DIAGNOSIS — T781XXA Other adverse food reactions, not elsewhere classified, initial encounter: Secondary | ICD-10-CM | POA: Diagnosis present

## 2015-09-12 DIAGNOSIS — Z794 Long term (current) use of insulin: Secondary | ICD-10-CM

## 2015-09-12 DIAGNOSIS — Z8249 Family history of ischemic heart disease and other diseases of the circulatory system: Secondary | ICD-10-CM | POA: Diagnosis not present

## 2015-09-12 DIAGNOSIS — I5043 Acute on chronic combined systolic (congestive) and diastolic (congestive) heart failure: Secondary | ICD-10-CM | POA: Diagnosis present

## 2015-09-12 DIAGNOSIS — Y95 Nosocomial condition: Secondary | ICD-10-CM | POA: Diagnosis present

## 2015-09-12 DIAGNOSIS — Z7901 Long term (current) use of anticoagulants: Secondary | ICD-10-CM

## 2015-09-12 DIAGNOSIS — I509 Heart failure, unspecified: Secondary | ICD-10-CM

## 2015-09-12 DIAGNOSIS — A419 Sepsis, unspecified organism: Principal | ICD-10-CM | POA: Diagnosis present

## 2015-09-12 DIAGNOSIS — I110 Hypertensive heart disease with heart failure: Secondary | ICD-10-CM | POA: Diagnosis present

## 2015-09-12 DIAGNOSIS — I248 Other forms of acute ischemic heart disease: Secondary | ICD-10-CM | POA: Diagnosis present

## 2015-09-12 DIAGNOSIS — R0602 Shortness of breath: Secondary | ICD-10-CM | POA: Diagnosis present

## 2015-09-12 DIAGNOSIS — J9601 Acute respiratory failure with hypoxia: Secondary | ICD-10-CM | POA: Diagnosis not present

## 2015-09-12 DIAGNOSIS — Z7984 Long term (current) use of oral hypoglycemic drugs: Secondary | ICD-10-CM | POA: Diagnosis not present

## 2015-09-12 DIAGNOSIS — J189 Pneumonia, unspecified organism: Secondary | ICD-10-CM | POA: Diagnosis present

## 2015-09-12 DIAGNOSIS — I252 Old myocardial infarction: Secondary | ICD-10-CM | POA: Diagnosis not present

## 2015-09-12 DIAGNOSIS — Z820 Family history of epilepsy and other diseases of the nervous system: Secondary | ICD-10-CM

## 2015-09-12 DIAGNOSIS — J9691 Respiratory failure, unspecified with hypoxia: Secondary | ICD-10-CM | POA: Diagnosis present

## 2015-09-12 DIAGNOSIS — Z91010 Allergy to peanuts: Secondary | ICD-10-CM | POA: Diagnosis not present

## 2015-09-12 DIAGNOSIS — Z7982 Long term (current) use of aspirin: Secondary | ICD-10-CM | POA: Diagnosis not present

## 2015-09-12 DIAGNOSIS — J9600 Acute respiratory failure, unspecified whether with hypoxia or hypercapnia: Secondary | ICD-10-CM | POA: Diagnosis present

## 2015-09-12 LAB — GLUCOSE, CAPILLARY
GLUCOSE-CAPILLARY: 341 mg/dL — AB (ref 65–99)
GLUCOSE-CAPILLARY: 271 mg/dL — AB (ref 65–99)
Glucose-Capillary: 314 mg/dL — ABNORMAL HIGH (ref 65–99)
Glucose-Capillary: 343 mg/dL — ABNORMAL HIGH (ref 65–99)

## 2015-09-12 LAB — I-STAT CG4 LACTIC ACID, ED
LACTIC ACID, VENOUS: 7 mmol/L — AB (ref 0.5–2.0)
LACTIC ACID, VENOUS: 3.48 mmol/L — AB (ref 0.5–2.0)

## 2015-09-12 LAB — I-STAT TROPONIN, ED: TROPONIN I, POC: 0.02 ng/mL (ref 0.00–0.08)

## 2015-09-12 LAB — BLOOD GAS, ARTERIAL
ACID-BASE EXCESS: 2.3 mmol/L — AB (ref 0.0–2.0)
Bicarbonate: 22.2 mEq/L (ref 20.0–24.0)
DRAWN BY: 22223
Delivery systems: POSITIVE
Expiratory PAP: 8
FIO2: 100
INSPIRATORY PAP: 16
O2 SAT: 97.2 %
PATIENT TEMPERATURE: 36.8
PCO2 ART: 43.5 mmHg (ref 35.0–45.0)
PO2 ART: 112 mmHg — AB (ref 80.0–100.0)
TCO2: 17.2 mmol/L (ref 0–100)
pH, Arterial: 7.337 — ABNORMAL LOW (ref 7.350–7.450)

## 2015-09-12 LAB — URINALYSIS, ROUTINE W REFLEX MICROSCOPIC
BILIRUBIN URINE: NEGATIVE
Glucose, UA: >1000 mg/dL — AB
HGB URINE DIPSTICK: NEGATIVE
Ketones, ur: NEGATIVE mg/dL
Leukocytes, UA: NEGATIVE
NITRITE: NEGATIVE
PROTEIN: NEGATIVE mg/dL
Specific Gravity, Urine: 1.01 (ref 1.005–1.030)
pH: 6 (ref 5.0–8.0)

## 2015-09-12 LAB — COMPREHENSIVE METABOLIC PANEL
ALBUMIN: 4.4 g/dL (ref 3.5–5.0)
ALT: 19 U/L (ref 17–63)
ANION GAP: 15 (ref 5–15)
AST: 25 U/L (ref 15–41)
Alkaline Phosphatase: 82 U/L (ref 38–126)
BUN: 32 mg/dL — ABNORMAL HIGH (ref 6–20)
CHLORIDE: 98 mmol/L — AB (ref 101–111)
CO2: 24 mmol/L (ref 22–32)
Calcium: 8.9 mg/dL (ref 8.9–10.3)
Creatinine, Ser: 1.77 mg/dL — ABNORMAL HIGH (ref 0.61–1.24)
GFR calc Af Amer: 41 mL/min — ABNORMAL LOW (ref >60)
GFR calc non Af Amer: 36 mL/min — ABNORMAL LOW (ref >60)
GLUCOSE: 389 mg/dL — AB (ref 65–99)
POTASSIUM: 3.5 mmol/L (ref 3.5–5.1)
SODIUM: 137 mmol/L (ref 135–145)
Total Bilirubin: 0.9 mg/dL (ref 0.3–1.2)
Total Protein: 7.9 g/dL (ref 6.5–8.1)

## 2015-09-12 LAB — CBC WITH DIFFERENTIAL/PLATELET
Basophils Absolute: 0.1 10*3/uL (ref 0.0–0.1)
Basophils Relative: 0 %
Eosinophils Absolute: 0.7 10*3/uL (ref 0.0–0.7)
Eosinophils Relative: 2 %
HCT: 42.9 % (ref 39.0–52.0)
Hemoglobin: 14.4 g/dL (ref 13.0–17.0)
LYMPHS PCT: 36 %
Lymphs Abs: 10.6 10*3/uL — ABNORMAL HIGH (ref 0.7–4.0)
MCH: 31.7 pg (ref 26.0–34.0)
MCHC: 33.6 g/dL (ref 30.0–36.0)
MCV: 94.5 fL (ref 78.0–100.0)
MONO ABS: 1.3 10*3/uL — AB (ref 0.1–1.0)
MONOS PCT: 4 %
NEUTROS ABS: 17.2 10*3/uL — AB (ref 1.7–7.7)
NEUTROS PCT: 58 %
Platelets: 322 10*3/uL (ref 150–400)
RBC: 4.54 MIL/uL (ref 4.22–5.81)
RDW: 13.4 % (ref 11.5–15.5)
WBC: 29.9 10*3/uL — ABNORMAL HIGH (ref 4.0–10.5)

## 2015-09-12 LAB — URINE MICROSCOPIC-ADD ON

## 2015-09-12 LAB — TROPONIN I
TROPONIN I: 0.94 ng/mL — AB (ref <0.031)
Troponin I: 0.57 ng/mL (ref <0.031)
Troponin I: 0.98 ng/mL (ref <0.031)

## 2015-09-12 LAB — BRAIN NATRIURETIC PEPTIDE: B Natriuretic Peptide: 408 pg/mL — ABNORMAL HIGH (ref 0.0–100.0)

## 2015-09-12 LAB — PROTIME-INR
INR: 1.3 (ref 0.00–1.49)
PROTHROMBIN TIME: 16.3 s — AB (ref 11.6–15.2)

## 2015-09-12 MED ORDER — METHYLPREDNISOLONE SODIUM SUCC 125 MG IJ SOLR
80.0000 mg | Freq: Once | INTRAMUSCULAR | Status: AC
  Administered 2015-09-12: 80 mg via INTRAVENOUS
  Filled 2015-09-12: qty 2

## 2015-09-12 MED ORDER — DEXTROSE 5 % IV SOLN
2.0000 g | Freq: Once | INTRAVENOUS | Status: AC
  Administered 2015-09-12: 2 g via INTRAVENOUS
  Filled 2015-09-12: qty 2

## 2015-09-12 MED ORDER — NITROGLYCERIN IN D5W 200-5 MCG/ML-% IV SOLN
0.0000 ug/min | INTRAVENOUS | Status: DC
  Administered 2015-09-12: 10 ug/min via INTRAVENOUS
  Filled 2015-09-12: qty 250

## 2015-09-12 MED ORDER — VANCOMYCIN HCL IN DEXTROSE 1-5 GM/200ML-% IV SOLN
1000.0000 mg | Freq: Once | INTRAVENOUS | Status: AC
  Administered 2015-09-12: 1000 mg via INTRAVENOUS
  Filled 2015-09-12: qty 200

## 2015-09-12 MED ORDER — FUROSEMIDE 10 MG/ML IJ SOLN
40.0000 mg | Freq: Two times a day (BID) | INTRAMUSCULAR | Status: DC
  Administered 2015-09-12 – 2015-09-14 (×5): 40 mg via INTRAVENOUS
  Filled 2015-09-12 (×5): qty 4

## 2015-09-12 MED ORDER — VANCOMYCIN HCL 10 G IV SOLR
1250.0000 mg | INTRAVENOUS | Status: DC
  Administered 2015-09-12: 1250 mg via INTRAVENOUS
  Filled 2015-09-12 (×4): qty 1250

## 2015-09-12 MED ORDER — ALPRAZOLAM 0.5 MG PO TABS: 0.5000 mg | ORAL_TABLET | Freq: Every day | ORAL | Status: DC | PRN

## 2015-09-12 MED ORDER — INSULIN ASPART 100 UNIT/ML ~~LOC~~ SOLN
0.0000 [IU] | Freq: Three times a day (TID) | SUBCUTANEOUS | Status: DC
  Administered 2015-09-12 (×2): 7 [IU] via SUBCUTANEOUS
  Administered 2015-09-12 – 2015-09-13 (×2): 5 [IU] via SUBCUTANEOUS

## 2015-09-12 MED ORDER — FUROSEMIDE 10 MG/ML IJ SOLN: 40.0000 mg | Freq: Once | INTRAMUSCULAR | Status: DC

## 2015-09-12 MED ORDER — METOPROLOL SUCCINATE ER 50 MG PO TB24
50.0000 mg | ORAL_TABLET | Freq: Two times a day (BID) | ORAL | Status: DC
  Administered 2015-09-12 – 2015-09-14 (×5): 50 mg via ORAL
  Filled 2015-09-12 (×5): qty 1

## 2015-09-12 MED ORDER — SODIUM CHLORIDE 0.9 % IV BOLUS (SEPSIS)
1000.0000 mL | Freq: Once | INTRAVENOUS | Status: AC
  Administered 2015-09-12: 1000 mL via INTRAVENOUS

## 2015-09-12 MED ORDER — APIXABAN 5 MG PO TABS
5.0000 mg | ORAL_TABLET | Freq: Two times a day (BID) | ORAL | Status: DC
Start: 2015-09-12 — End: 2015-09-14
  Administered 2015-09-12 – 2015-09-14 (×5): 5 mg via ORAL
  Filled 2015-09-12 (×5): qty 1

## 2015-09-12 MED ORDER — POTASSIUM CHLORIDE CRYS ER 20 MEQ PO TBCR
40.0000 meq | EXTENDED_RELEASE_TABLET | Freq: Every day | ORAL | Status: DC
  Administered 2015-09-12 – 2015-09-14 (×3): 40 meq via ORAL
  Filled 2015-09-12 (×3): qty 2

## 2015-09-12 MED ORDER — LEVALBUTEROL HCL 0.63 MG/3ML IN NEBU
0.6300 mg | INHALATION_SOLUTION | Freq: Four times a day (QID) | RESPIRATORY_TRACT | Status: DC | PRN
Start: 2015-09-12 — End: 2015-09-14

## 2015-09-12 MED ORDER — SODIUM CHLORIDE 0.9 % IV SOLN
INTRAVENOUS | Status: DC
  Administered 2015-09-12: 12:00:00 via INTRAVENOUS

## 2015-09-12 MED ORDER — DEXTROSE 5 % IV SOLN
1.0000 g | Freq: Three times a day (TID) | INTRAVENOUS | Status: DC
  Administered 2015-09-12 – 2015-09-13 (×3): 1 g via INTRAVENOUS
  Filled 2015-09-12 (×13): qty 1

## 2015-09-12 MED ORDER — PRAVASTATIN SODIUM 40 MG PO TABS
40.0000 mg | ORAL_TABLET | Freq: Every day | ORAL | Status: DC
Start: 2015-09-12 — End: 2015-09-14
  Administered 2015-09-12 – 2015-09-13 (×2): 40 mg via ORAL
  Filled 2015-09-12 (×2): qty 1

## 2015-09-12 MED ORDER — DILTIAZEM HCL ER COATED BEADS 120 MG PO CP24
360.0000 mg | ORAL_CAPSULE | Freq: Every day | ORAL | Status: DC
Start: 2015-09-12 — End: 2015-09-14
  Administered 2015-09-12 – 2015-09-14 (×3): 360 mg via ORAL
  Filled 2015-09-12 (×8): qty 3

## 2015-09-12 MED ORDER — ASPIRIN 81 MG PO CHEW
81.0000 mg | CHEWABLE_TABLET | Freq: Every day | ORAL | Status: DC
  Administered 2015-09-12 – 2015-09-14 (×3): 81 mg via ORAL
  Filled 2015-09-12 (×3): qty 1

## 2015-09-12 NOTE — H&P (Signed)
PCP:   [name], MD   Chief Complaint:  Shortness of breath  HPI:  75 year old male who  has a past medical history of Diabetes mellitus without complication (HCC); Hypertension; CAD (coronary artery disease); Atrial fibrillation (HCC); Chronic combined systolic and diastolic CHF (congestive heart failure) (HCC); and Acute respiratory failure (HCC). today came to the hospital with shortness of breath. Patient drove himself to the ER and was wanting the home when security found that he was dyspneic and they could not understand him. Patient was brought emergently to the ED was found to be hypoxic initially O2 sats 58% on room air. Patient on put on BiPAP. Chest x-ray showed pulmonary edema. But WBC was 29,000 with lactate 7 there was a concern for possible pneumonia and he was empirically started on vancomycin and aztreonam. Critical care was consulted by the ED physician and they recommended only 1 L of fluids and not whole sepsis protocol due to worsening of pulmonary edema. Patient was put on BiPAP and has now improved. BiPAP was removed and patient able to answer questions. As per patient he got short of breath after ate peanut  butter sandwich. Denies any fever or chills. No nausea vomiting or diarrhea. Denies any chest pain.   Allergies:   Allergies  Allergen Reactions  . Penicillins       Past Medical History  Diagnosis Date  . Diabetes mellitus without complication (HCC)     x  8 yrs.  . Hypertension   . CAD (coronary artery disease)     a. s/p CABG 2001. b. NSTEMI 08/2013 - cath done, med rx recommended.  . Atrial fibrillation (HCC)     a. Dx 08/2013. Placed on eliquis. Plan DCCV as outpt. Also has had WCT c/w aberrancy.  . Chronic combined systolic and diastolic CHF (congestive heart failure) (HCC)   . Acute respiratory failure (HCC)     a. 08/2013 - VDRF due to pulm edema.    Past Surgical History  Procedure Laterality Date  . Left heart cath  2015    Dr Allyson Sabal, with  stenting  . Coronary artery bypass graft  2001  . Left heart catheterization with coronary angiogram N/A 08/21/2013    Procedure: LEFT HEART CATHETERIZATION WITH CORONARY ANGIOGRAM;  Surgeon: Runell Gess, MD;  Location: [facility] CATH LAB;  Service: Cardiovascular;  Laterality: N/A;    Prior to Admission medications   Medication Sig Start Date End Date Taking? Authorizing Provider  ALPRAZolam Prudy Feeler) 0.5 MG tablet Take 0.5 mg by mouth daily as needed for anxiety.    Historical Provider, MD  apixaban (ELIQUIS) 5 MG TABS tablet TAKE (1) TABLET TWICE DAILY. 10/01/14   Lars Masson, MD  aspirin 81 MG chewable tablet Chew 1 tablet (81 mg total) by mouth daily. 08/23/13   Lonia Blood, MD  calcium-vitamin D (OSCAL WITH D) 500-200 MG-UNIT per tablet Take 1 tablet by mouth daily.    Historical Provider, MD  diltiazem (CARDIZEM LA) 120 MG 24 hr tablet Take 3 tablets (360 mg total) by mouth daily. 10/01/14   Lars Masson, MD  furosemide (LASIX) 40 MG tablet TAKE 80 MG (2 TABS) IN THE MORNING AND 40 MG (1 TAB) IN THE EVENING 10/01/14   Lars Masson, MD  glipiZIDE-metformin (METAGLIP) 5-500 MG per tablet Take 2 tablets by mouth 2 (two) times daily.    Historical Provider, MD  lisinopril (PRINIVIL,ZESTRIL) 10 MG tablet Take 1 tablet (10 mg total) by mouth daily.  10/01/14   Lars Masson, MD  metoprolol succinate (TOPROL-XL) 50 MG 24 hr tablet Take 1 tablet (50 mg total) by mouth 2 (two) times daily. Take with or immediately following a meal. 10/01/14   Lars Masson, MD  Multiple Vitamin (MULTIVITAMIN WITH MINERALS) TABS tablet Take 1 tablet by mouth daily.    Historical Provider, MD  potassium chloride SA (K-DUR,KLOR-CON) 20 MEQ tablet Take 2 tablets (40 mEq total) by mouth daily. 10/01/14   Lars Masson, MD  pravastatin (PRAVACHOL) 40 MG tablet Take 1 tablet (40 mg total) by mouth at bedtime. 10/01/14   Lars Masson, MD    Social History:  reports that he has never smoked. He  does not have any smokeless tobacco history on file. He reports that he does not drink alcohol or use illicit drugs.  Family History  Problem Relation Age of Onset  . Hypertension    . Hyperlipidemia    . Alzheimer's disease Mother   . Heart attack Father     Ceasar Mons Weights   09/12/15 0247  Weight: 96.616 kg (213 lb)    All the positives are listed in BOLD  Review of Systems:  HEENT: Headache, blurred vision, runny nose, sore throat Neck: Hypothyroidism, hyperthyroidism,,lymphadenopathy Chest : Shortness of breath, history of COPD, Asthma Heart : Chest pain, history of coronary arterey disease GI:  Nausea, vomiting, diarrhea, constipation, GERD GU: Dysuria, urgency, frequency of urination, hematuria Neuro: Stroke, seizures, syncope Psych: Depression, anxiety, hallucinations   Physical Exam: Blood pressure 132/87, pulse 84, temperature 98.8 F (37.1 C), temperature source Rectal, resp. rate 22, weight 96.616 kg (213 lb), SpO2 90 %. Constitutional:   Patient is a well-developed and well-nourished male  in no acute distress and cooperative with exam. Head: Normocephalic and atraumatic Mouth: Mucus membranes moist Eyes: PERRL, EOMI, conjunctivae normal Neck: Supple, No Thyromegaly Cardiovascular: RRR, S1 normal, S2 normal Pulmonary/Chest: Bilateral crackles  Abdominal: Soft. Non-tender, non-distended, bowel sounds are normal, no masses, organomegaly, or guarding present.  Neurological: A&O x3, Strength is normal and symmetric bilaterally, cranial nerve II-XII are grossly intact, no focal motor deficit, sensory intact to light touch bilaterally.  Extremities : No Cyanosis, Clubbing or Edema  Labs on Admission:  Basic Metabolic Panel:  Recent Labs Lab 09/12/15 0237  NA 137  K 3.5  CL 98*  CO2 24  GLUCOSE 389*  BUN 32*  CREATININE 1.77*  CALCIUM 8.9   Liver Function Tests:  Recent Labs Lab 09/12/15 0237  AST 25  ALT 19  ALKPHOS 82  BILITOT 0.9  PROT 7.9    ALBUMIN 4.4   No results for input(s): LIPASE, AMYLASE in the last 168 hours. No results for input(s): AMMONIA in the last 168 hours. CBC:  Recent Labs Lab 09/12/15 0237  WBC 29.9*  NEUTROABS 17.2*  HGB 14.4  HCT 42.9  MCV 94.5  PLT 322   Cardiac Enzymes: No results for input(s): CKTOTAL, CKMB, CKMBINDEX, TROPONINI in the last 168 hours.  BNP (last 3 results)  Recent Labs  08/29/15 2230 09/12/15 0237  BNP 304.0* 408.0*      Radiological Exams on Admission: Dg Chest Port 1 View  09/12/2015  CLINICAL DATA:  Acute onset of severe shortness of breath. Decreased O2 saturation. Initial encounter. EXAM: PORTABLE CHEST 1 VIEW COMPARISON:  Chest radiograph from 08/29/2015 FINDINGS: The lungs are well-aerated. Vascular congestion is noted. Increased interstitial markings raise concern for mild pulmonary edema. There is no evidence of pleural effusion or pneumothorax. The  cardiomediastinal silhouette is mildly enlarged. The patient is status post median sternotomy. No acute osseous abnormalities are seen. IMPRESSION: Vascular congestion and mild cardiomegaly. Increased interstitial markings raise concern for mild pulmonary edema. Electronically Signed   By: Roanna Raider M.D.   On: 09/12/2015 03:19    EKG: Atrial fibrillation   Assessment/Plan Active Problems:   Diabetes mellitus, type 2 (HCC)   Acute on chronic combined systolic and diastolic CHF (congestive heart failure) (HCC)   Atrial fibrillation (HCC)   CHF exacerbation (HCC)   Anticoagulant long-term use   Respiratory failure with hypoxia (HCC)   Acute respiratory failure (HCC)   Acute hypoxic respiratory failure Multifactorial, from pulmonary edema, healthcare associated pneumonia,? Allergic reaction to peanut butter. Continue vancomycin and  aztreonam per pharmacy consultation . BiPAP when necessary, will start Lasix 40 mg IV every 12 hours One dose of Solu-Medrol 80 mg IV given by the ED physician. Will repeat  lactic acid every 3 hours  Diabetes mellitus Continue sliding scale insulin with NovoLog  Atrial fibrillation Heart rate is controlled, continue metoprolol and eliquis  Code status:DO NOT RESUSCITATE  Family discussion: No family present at bedside   Time Spent on Admission: 60 min  LAMA,GAGAN S [facility] Pager: [phone] 09/12/2015, 5:15 AM  If 7PM-7AM, please contact night-coverage  [email or web address]  Password [email or web address]

## 2015-09-12 NOTE — Progress Notes (Signed)
Patient admitted to the hospital earlier this morning by Dr. Sharl Ma  Patient seen and examined. He is currently on bipap but feels that his shortness of breath is improving. Exam reveals crackles at lung bases and 1+ edema in the lower extremities.   He has been admitted with acute resp failure and has evidence of pulmonary edema/decompensated CHF on chest xray. He has been started on IV lasix. Troponin is mildly elevated, likely demand ischemia. He does not have acute EKG changes. He has a history of atrial fibrillation and is anticoagulated with eliquis. He also has an elevated wbc count and due to concerns of underlying pneumonia, he was started on intravenous antibiotics. Will continue current treatments for now.  MEMON,JEHANZEB

## 2015-09-12 NOTE — Progress Notes (Signed)
Pharmacy Antibiotic Note  Randy Nunez is a 75 y.o. male admitted on 09/12/2015 with pneumonia.  Pharmacy has been consulted for Vancomycin and Aztreonam dosing.  Plan: 75yo male presented to ED with SOB.  WBC elevated, concern for pna.  Asked to initiate Vancomycin and Aztreonam.  WBC and SCr elevated on admission.  Lactate elevated.    Vancomycin 1250mg  IV q24hrs (goal trough 15-20)  Aztreonam 1gm IV q8h  Monitor labs, renal fxn, progress and c/s  Height: 6' (182.9 cm) Weight: 198 lb 3.1 oz (89.9 kg) IBW/kg (Calculated) : 77.6  Temp (24hrs), Avg:98.7 F (37.1 C), Min:98.4 F (36.9 C), Max:99.1 F (37.3 C)   Recent Labs Lab 09/12/15 0237 09/12/15 0326 09/12/15 0528  WBC 29.9*  --   --   CREATININE 1.77*  --   --   LATICACIDVEN  --  7.00* 3.48*    Estimated Creatinine Clearance: 39 mL/min (by C-G formula based on Cr of 1.77).    Allergies  Allergen Reactions  . Penicillins    Antimicrobials this admission: Vancomycin 2/9  >>  Aztreonam 2/9 >>   Microbiology results: 2/9 BCx: pending 2/9 UCx: pending    Thank you for allowing pharmacy to be a part of this patient's care.  Valrie Hart A 09/12/2015 7:44 AM

## 2015-09-12 NOTE — Care Management Note (Signed)
Case Management Note  Patient Details  Name: CONNER HANING MRN: [id number] Date of Birth: 1941/03/21  Subjective/Objective:                  Pt is from home, lives alone and is ind with ADL's. Pt has cane that he uses for ambulation. Pt reports his PCP was in the process of referring him for home O2 and HH services. CM has attempted to contact PCP's office to determine what company HH and O2 have been ordered through and to continue/complete those referrals.  Action/Plan: CM will cont to contact PCP's office to continue what they already have in place.   Expected Discharge Date:  09/15/15               Expected Discharge Plan:  Home w Home Health Services  In-House Referral:  NA  Discharge planning Services  CM Consult  Post Acute Care Choice:    Choice offered to:     DME Arranged:    DME Agency:     HH Arranged:    HH Agency:     Status of Service:  In process, will continue to follow  Medicare Important Message Given:    Date Medicare IM Given:    Medicare IM give by:    Date Additional Medicare IM Given:    Additional Medicare Important Message give by:     If discussed at Long Length of Stay Meetings, dates discussed:    Additional Comments:  Malcolm Metro, RN 09/12/2015, 3:50 PM

## 2015-09-12 NOTE — ED Provider Notes (Addendum)
CSN: [id number]     Arrival date & time 09/12/15  4098 History   First MD Initiated Contact with Patient 09/12/15 [phone]     Chief Complaint  Patient presents with  . Shortness of Breath     (Consider location/radiation/quality/duration/timing/severity/associated sxs/prior Treatment) HPI  This is a 75 yo  male with history of diabetes, hypertension, coronary artery disease, atrial fibrillation, heart failure who presents with shortness of breath. Patient reports acute onset of shortness of breath approximately 2-1/2 hours ago. He drove himself to the ER but was unable to get out of the car. He was honking his horn and when security found him he was dyspneic and they could not understand him. He was brought back emergently. He appears to be in acute respiratory distress with tachypnea and hypoxia. Initial O2 sats 58% on room air. Denies any chest pain. Denies any recent fevers or leg swelling.  Level V caveat for acuity of condition  Past Medical History  Diagnosis Date  . Diabetes mellitus without complication (HCC)     x  8 yrs.  . Hypertension   . CAD (coronary artery disease)     a. s/p CABG 2001. b. NSTEMI 08/2013 - cath done, med rx recommended.  . Atrial fibrillation (HCC)     a. Dx 08/2013. Placed on eliquis. Plan DCCV as outpt. Also has had WCT c/w aberrancy.  . Chronic combined systolic and diastolic CHF (congestive heart failure) (HCC)   . Acute respiratory failure (HCC)     a. 08/2013 - VDRF due to pulm edema.   Past Surgical History  Procedure Laterality Date  . Left heart cath  2015    Dr Allyson Sabal, with stenting  . Coronary artery bypass graft  2001  . Left heart catheterization with coronary angiogram N/A 08/21/2013    Procedure: LEFT HEART CATHETERIZATION WITH CORONARY ANGIOGRAM;  Surgeon: Runell Gess, MD;  Location: [facility] CATH LAB;  Service: Cardiovascular;  Laterality: N/A;   Family History  Problem Relation Age of Onset  . Hypertension    . Hyperlipidemia    .  Alzheimer's disease Mother   . Heart attack Father    Social History  Substance Use Topics  . Smoking status: Never Smoker   . Smokeless tobacco: None  . Alcohol Use: No    Review of Systems  Unable to perform ROS: Acuity of condition      Allergies  Penicillins  Home Medications   Prior to Admission medications   Medication Sig Start Date End Date Taking? Authorizing Provider  ALPRAZolam Prudy Feeler) 0.5 MG tablet Take 0.5 mg by mouth daily as needed for anxiety.    Historical Provider, MD  apixaban (ELIQUIS) 5 MG TABS tablet TAKE (1) TABLET TWICE DAILY. 10/01/14   Lars Masson, MD  aspirin 81 MG chewable tablet Chew 1 tablet (81 mg total) by mouth daily. 08/23/13   Lonia Blood, MD  calcium-vitamin D (OSCAL WITH D) 500-200 MG-UNIT per tablet Take 1 tablet by mouth daily.    Historical Provider, MD  diltiazem (CARDIZEM LA) 120 MG 24 hr tablet Take 3 tablets (360 mg total) by mouth daily. 10/01/14   Lars Masson, MD  furosemide (LASIX) 40 MG tablet TAKE 80 MG (2 TABS) IN THE MORNING AND 40 MG (1 TAB) IN THE EVENING 10/01/14   Lars Masson, MD  glipiZIDE-metformin (METAGLIP) 5-500 MG per tablet Take 2 tablets by mouth 2 (two) times daily.    Historical Provider, MD  lisinopril (PRINIVIL,ZESTRIL)  10 MG tablet Take 1 tablet (10 mg total) by mouth daily. 10/01/14   Lars Masson, MD  metoprolol succinate (TOPROL-XL) 50 MG 24 hr tablet Take 1 tablet (50 mg total) by mouth 2 (two) times daily. Take with or immediately following a meal. 10/01/14   Lars Masson, MD  Multiple Vitamin (MULTIVITAMIN WITH MINERALS) TABS tablet Take 1 tablet by mouth daily.    Historical Provider, MD  potassium chloride SA (K-DUR,KLOR-CON) 20 MEQ tablet Take 2 tablets (40 mEq total) by mouth daily. 10/01/14   Lars Masson, MD  pravastatin (PRAVACHOL) 40 MG tablet Take 1 tablet (40 mg total) by mouth at bedtime. 10/01/14   Lars Masson, MD   BP 153/73 mmHg  Pulse 76  Temp(Src) 98.8  F (37.1 C) (Rectal)  Resp 21  Wt 213 lb (96.616 kg)  SpO2 95% Physical Exam  Constitutional: He is oriented to person, place, and time.  Ill-appearing, gray, acute respiratory distress  HENT:  Head: Normocephalic and atraumatic.  Eyes: Pupils are equal, round, and reactive to light.  Cardiovascular: Normal heart sounds.   No murmur heard. Normal rate, irregular rhythm  Pulmonary/Chest: He is in respiratory distress. He has no wheezes. He has rales.  Increased work of breathing, tachypnea, Rales and crackles bilateral lung fields, well-healed midline sternotomy scar  Abdominal: Soft. Bowel sounds are normal. There is no tenderness. There is no rebound.  Musculoskeletal: He exhibits edema.  Trace bilateral lower extremity edema  Neurological: He is alert and oriented to person, place, and time.  Skin:  Pale, perioral and peripheral cyanosis, cool  Psychiatric: He has a normal mood and affect.  Nursing note and vitals reviewed.   ED Course  Procedures (including critical care time)  CRITICAL CARE Performed by: Shon Baton   Total critical care time: 75 minutes  Critical care time was exclusive of separately billable procedures and treating other patients.  Critical care was necessary to treat or prevent imminent or life-threatening deterioration.  Critical care was time spent personally by me on the following activities: development of treatment plan with patient and/or surrogate as well as nursing, discussions with consultants, evaluation of patient's response to treatment, examination of patient, obtaining history from patient or surrogate, ordering and performing treatments and interventions, ordering and review of laboratory studies, ordering and review of radiographic studies, pulse oximetry and re-evaluation of patient's condition.  Labs Review Labs Reviewed  COMPREHENSIVE METABOLIC PANEL - Abnormal; Notable for the following:    Chloride 98 (*)    Glucose, Bld  389 (*)    BUN 32 (*)    Creatinine, Ser 1.77 (*)    GFR calc non Af Amer 36 (*)    GFR calc Af Amer 41 (*)    All other components within normal limits  CBC WITH DIFFERENTIAL/PLATELET - Abnormal; Notable for the following:    WBC 29.9 (*)    Neutro Abs 17.2 (*)    Lymphs Abs 10.6 (*)    Monocytes Absolute 1.3 (*)    All other components within normal limits  URINALYSIS, ROUTINE W REFLEX MICROSCOPIC (NOT AT [facility]) - Abnormal; Notable for the following:    Glucose, UA >1000 (*)    All other components within normal limits  BRAIN NATRIURETIC PEPTIDE - Abnormal; Notable for the following:    B Natriuretic Peptide 408.0 (*)    All other components within normal limits  BLOOD GAS, ARTERIAL - Abnormal; Notable for the following:    pH, Arterial  7.337 (*)    pO2, Arterial 112.0 (*)    Acid-Base Excess 2.3 (*)    All other components within normal limits  PROTIME-INR - Abnormal; Notable for the following:    Prothrombin Time 16.3 (*)    All other components within normal limits  URINE MICROSCOPIC-ADD ON - Abnormal; Notable for the following:    Squamous Epithelial / LPF 0-5 (*)    Bacteria, UA FEW (*)    Casts HYALINE CASTS (*)    All other components within normal limits  I-STAT CG4 LACTIC ACID, ED - Abnormal; Notable for the following:    Lactic Acid, Venous 7.00 (*)    All other components within normal limits  CULTURE, BLOOD (ROUTINE X 2)  CULTURE, BLOOD (ROUTINE X 2)  URINE CULTURE  PATHOLOGIST SMEAR REVIEW  I-STAT TROPOININ, ED    Imaging Review Dg Chest Port 1 View  09/12/2015  CLINICAL DATA:  Acute onset of severe shortness of breath. Decreased O2 saturation. Initial encounter. EXAM: PORTABLE CHEST 1 VIEW COMPARISON:  Chest radiograph from 08/29/2015 FINDINGS: The lungs are well-aerated. Vascular congestion is noted. Increased interstitial markings raise concern for mild pulmonary edema. There is no evidence of pleural effusion or pneumothorax. The cardiomediastinal  silhouette is mildly enlarged. The patient is status post median sternotomy. No acute osseous abnormalities are seen. IMPRESSION: Vascular congestion and mild cardiomegaly. Increased interstitial markings raise concern for mild pulmonary edema. Electronically Signed   By: Roanna Raider M.D.   On: 09/12/2015 03:19   I have personally reviewed and evaluated these images and lab results as part of my medical decision-making.   EKG Interpretation ED ECG REPORT   Date: 09/12/2015  Rate:94  Rhythm: atrial fibrillation  QRS Axis: normal  Intervals: QT prolonged  ST/T Wave abnormalities: normal  Conduction Disutrbances:none  Narrative Interpretation:   Old EKG Reviewed: unchanged  I have personally reviewed the EKG tracing and agree with the computerized printout as noted.       MDM   Final diagnoses:  Acute on chronic respiratory failure with hypoxia (HCC)  Sepsis, due to unspecified organism [facility])    On initial evaluation, patient appears in acute respiratory distress with hypoxia. He continues to [facility] appropriately. He was emergently placed on BiPAP with improvement of his hypoxia.  Lung exam is most suggestive of pulmonary edema. He is afebrile; however, sepsis workup initiated as well given patient's acuity of condition.  Patient feels much improved on BiPAP. Initial lab work notable for white blood count of 29.9. BNP is elevated at 408.  Given leukocytosis and acuity of his condition, patient was given broad-spectrum antibiotics including vancomycin and aztreonam. His x-ray shows concern for mild pulmonary edema and his presentation is more concerning for pulmonary edema; however, lactate is 7. He was given 1 L of fluids. Discussed with critical care, Dr. Belia Heman.  We will hold further fluid resuscitation at this time and monitor clinically given that the patient is not hypotensive. ABG is reassuring. Patient is on chronic anticoagulation secondary to atrial fibrillation, doubt PE as  the origin of his acute respiratory distress. There are no critical care beds at [facility]. Will admit to [facility] stepdown unit. Discussed with Dr. Sharl Ma.    Shon Baton, MD 09/12/15 [phone]  CODE STATUS was confirmed with the patient. He is full code.  Shon Baton, MD 09/12/15 [phone]  Dr. Sharl Ma is at the bedside. Patient is unable to talk because of the BiPAP. BiPAP was temporarily were removed.  He was placed on 6 L of nasal cannula and is satting between 85 and 90%. He reports improvement. He is able to provide further history as he is no longer in acute distress. He states that he had onset of shortness of breath after eating peanut butter. He states that this sometimes happens. He states that he continues to eat peanut butter because "he likes it." No rash or oral swelling was noted on the patient upon arrival. Nor is that patient hypotensive.  Patient was given Solu-Medrol to cover for allergic reaction. Also further discussion with the patient regarding his CODE STATUS. He states he was on a ventilator previously and did not like it. He does not wish to be placed on a ventilator and also does not want chest compressions.  CODE STATUS was changed in the chart to DO NOT RESUSCITATE.  Shon Baton, MD 09/12/15 0505  6:21 AM Patient again more disconnected. Hospitalist not available. Patient was placed back on BiPAP. O2 sats on a nonrebreather were 83-85%. Repeat chest x-ray ordered. Patient was also placed on a nitroglycerin drip given history of heart failure and an EF of 35%. He will likely ultimately need diuresis but given mixed/unclear picture, no aggressive diuresis attempted at this time. I have requested that the nurse paged the hospitalist.  Shon Baton, MD 09/12/15 3662  Repeat checks x-ray with mildly increased vascular markings consistent with pulmonary edema. Discussed with the hospitalist. Order was placed for 40 mg of IV Lasix. Patient is satting 99% on  BiPAP and looks much more comfortable.  Shon Baton, MD 09/12/15 [phone]

## 2015-09-12 NOTE — Progress Notes (Signed)
Noted that blood sugars are greater than 300 mg/dl. Takes Metaglip at home for diabetes. On Solu-medrol. Recommend starting low dose basal insulin Levemir 10 units daily and increasing Novolog correction scale to MODERATE TID & HS if blood sugars continue to be elevated. Titrate dosage as needed. Will monitor blood sugars while in the hospital. Smith Mince RN BSN CDE

## 2015-09-12 NOTE — ED Notes (Signed)
Pt states he has been sob for 2 hours, denies cp.  Pt drove himself himself here

## 2015-09-13 ENCOUNTER — Other Ambulatory Visit: Payer: Self-pay | Admitting: *Deleted

## 2015-09-13 DIAGNOSIS — J9601 Acute respiratory failure with hypoxia: Secondary | ICD-10-CM

## 2015-09-13 DIAGNOSIS — E119 Type 2 diabetes mellitus without complications: Secondary | ICD-10-CM

## 2015-09-13 LAB — URINE CULTURE: Culture: NO GROWTH

## 2015-09-13 LAB — COMPREHENSIVE METABOLIC PANEL
ALT: 16 U/L — AB (ref 17–63)
AST: 23 U/L (ref 15–41)
Albumin: 3.7 g/dL (ref 3.5–5.0)
Alkaline Phosphatase: 56 U/L (ref 38–126)
Anion gap: 12 (ref 5–15)
BILIRUBIN TOTAL: 1.5 mg/dL — AB (ref 0.3–1.2)
BUN: 32 mg/dL — AB (ref 6–20)
CO2: 25 mmol/L (ref 22–32)
Calcium: 8.4 mg/dL — ABNORMAL LOW (ref 8.9–10.3)
Chloride: 103 mmol/L (ref 101–111)
Creatinine, Ser: 1.41 mg/dL — ABNORMAL HIGH (ref 0.61–1.24)
GFR, EST AFRICAN AMERICAN: 54 mL/min — AB (ref >60)
GFR, EST NON AFRICAN AMERICAN: 47 mL/min — AB (ref >60)
GLUCOSE: 284 mg/dL — AB (ref 65–99)
POTASSIUM: 4.1 mmol/L (ref 3.5–5.1)
Sodium: 140 mmol/L (ref 135–145)
Total Protein: 6.8 g/dL (ref 6.5–8.1)

## 2015-09-13 LAB — CBC
HEMATOCRIT: 36.7 % — AB (ref 39.0–52.0)
Hemoglobin: 12.3 g/dL — ABNORMAL LOW (ref 13.0–17.0)
MCH: 30.7 pg (ref 26.0–34.0)
MCHC: 33.5 g/dL (ref 30.0–36.0)
MCV: 91.5 fL (ref 78.0–100.0)
Platelets: 276 10*3/uL (ref 150–400)
RBC: 4.01 MIL/uL — ABNORMAL LOW (ref 4.22–5.81)
RDW: 13.5 % (ref 11.5–15.5)
WBC: 19.3 10*3/uL — AB (ref 4.0–10.5)

## 2015-09-13 LAB — GLUCOSE, CAPILLARY
GLUCOSE-CAPILLARY: 265 mg/dL — AB (ref 65–99)
GLUCOSE-CAPILLARY: 447 mg/dL — AB (ref 65–99)
GLUCOSE-CAPILLARY: 513 mg/dL — AB (ref 65–99)
Glucose-Capillary: 460 mg/dL — ABNORMAL HIGH (ref 65–99)
Glucose-Capillary: 371 mg/dL — ABNORMAL HIGH (ref 65–99)
Glucose-Capillary: 209 mg/dL — ABNORMAL HIGH (ref 65–99)
Glucose-Capillary: 125 mg/dL — ABNORMAL HIGH (ref 65–99)

## 2015-09-13 LAB — HEMOGLOBIN A1C
Hgb A1c MFr Bld: 10.4 % — ABNORMAL HIGH (ref 4.8–5.6)
Mean Plasma Glucose: 252 mg/dL

## 2015-09-13 LAB — GLUCOSE, RANDOM: Glucose, Bld: 453 mg/dL — ABNORMAL HIGH (ref 65–99)

## 2015-09-13 LAB — PATHOLOGIST SMEAR REVIEW

## 2015-09-13 MED ORDER — LEVOFLOXACIN IN D5W 750 MG/150ML IV SOLN
750.0000 mg | INTRAVENOUS | Status: DC
  Administered 2015-09-13 – 2015-09-14 (×2): 750 mg via INTRAVENOUS
  Filled 2015-09-13 (×2): qty 150

## 2015-09-13 MED ORDER — INSULIN DETEMIR 100 UNIT/ML ~~LOC~~ SOLN
20.0000 [IU] | Freq: Every day | SUBCUTANEOUS | Status: DC
  Administered 2015-09-13 – 2015-09-14 (×2): 20 [IU] via SUBCUTANEOUS
  Filled 2015-09-13 (×4): qty 0.2

## 2015-09-13 MED ORDER — INSULIN ASPART 100 UNIT/ML ~~LOC~~ SOLN: 0.0000 [IU] | Freq: Every day | SUBCUTANEOUS | Status: DC

## 2015-09-13 MED ORDER — INSULIN DETEMIR 100 UNIT/ML ~~LOC~~ SOLN
20.0000 [IU] | Freq: Every day | SUBCUTANEOUS | Status: DC
  Filled 2015-09-13 (×3): qty 0.2

## 2015-09-13 MED ORDER — INSULIN DETEMIR 100 UNIT/ML ~~LOC~~ SOLN
10.0000 [IU] | SUBCUTANEOUS | Status: DC
  Filled 2015-09-13: qty 0.1

## 2015-09-13 MED ORDER — IPRATROPIUM-ALBUTEROL 0.5-2.5 (3) MG/3ML IN SOLN: 3.0000 mL | Freq: Four times a day (QID) | RESPIRATORY_TRACT | Status: DC

## 2015-09-13 MED ORDER — INSULIN ASPART 100 UNIT/ML ~~LOC~~ SOLN
10.0000 [IU] | Freq: Three times a day (TID) | SUBCUTANEOUS | Status: DC
  Administered 2015-09-13 – 2015-09-14 (×4): 10 [IU] via SUBCUTANEOUS

## 2015-09-13 MED ORDER — INSULIN ASPART 100 UNIT/ML ~~LOC~~ SOLN
25.0000 [IU] | Freq: Once | SUBCUTANEOUS | Status: AC
  Administered 2015-09-13: 25 [IU] via SUBCUTANEOUS

## 2015-09-13 MED ORDER — GUAIFENESIN ER 600 MG PO TB12
1200.0000 mg | ORAL_TABLET | Freq: Two times a day (BID) | ORAL | Status: DC
  Administered 2015-09-13 – 2015-09-14 (×3): 1200 mg via ORAL
  Filled 2015-09-13 (×3): qty 2

## 2015-09-13 MED ORDER — INSULIN ASPART 100 UNIT/ML ~~LOC~~ SOLN
0.0000 [IU] | Freq: Three times a day (TID) | SUBCUTANEOUS | Status: DC
Start: 2015-09-13 — End: 2015-09-14
  Administered 2015-09-13: 20 [IU] via SUBCUTANEOUS
  Administered 2015-09-13: 17 [IU] via SUBCUTANEOUS
  Administered 2015-09-14: 4 [IU] via SUBCUTANEOUS

## 2015-09-13 MED ORDER — INSULIN DETEMIR 100 UNIT/ML ~~LOC~~ SOLN
13.0000 [IU] | Freq: Every day | SUBCUTANEOUS | Status: DC
  Filled 2015-09-13 (×4): qty 0.13

## 2015-09-13 NOTE — Progress Notes (Signed)
[facility] PROGRESS NOTE  KMARION RAWL RUE:[id number] DOB: [birth year] DOA: 09/12/2015 PCP: [name], MD  Assessment/Plan: 1. Acute Hypoxic respiratory failure which is multifactoral due to pulmonary edema and HCAP, improving. Patient did not require BIPAP overnight. Will continue to wean off supplemental O2 as tolerated.  2. HCAP, WBC 19.3, though he is afebrile. Since he is improving will discontinue vancomycin and aztreonam and start on levaquin. Continue pulmonary hygiene.  3. Acute on chronic combined CHF. ECHO from 08/30/15 shows ef of 35-40%. Urine output was 6.5 liters yesterday. Since he still has evidence of volume overload, will continue with IV lasix today. Monitor I&O's  4. Elevated Troponin, possibly from demand ischemia. Troponin trending down. EKG does not indicate any acute changes. He denies any current CP. 5. Atrial Fibrillation, chronic. Currently stable. Continue metroprolol and anticoagulation with eliquis 6. Diabetes mellitus type 2, glucose remains elevated. Will start on low dose Levemir and increased novolog scale to moderate TID and HS per diabetes coordinator  Code Status: DNR DVT prophylaxis: Eliquis Family Communication: No family at bedside.  Disposition Plan: Move to medical floor today.    Consultants:  Diabetes management  Procedures:  none  Antibiotics:  Vancomycin 2/9 >> 2/10  Aztreonam 2/9>> 2/10  levaquin 2/10 >>   HPI/Subjective: Feels improved. Did not wear the BIPAP overnight. Denies any CP and SOB is improving. States that he does not typically wear O2 at home.   Objective: Filed Vitals:   09/13/15 0500 09/13/15 0600  BP: 125/85 120/102  Pulse: 85 68  Temp: 98.1 F (36.7 C) 98.2 F (36.8 C)  Resp: 11 19    Intake/Output Summary (Last 24 hours) at 09/13/15 0727 Last data filed at 09/13/15 0600  Gross per 24 hour  Intake 969.34 ml  Output   6550 ml  Net -5580.66 ml   Filed Weights   09/12/15 0247 09/12/15 0651   Weight: 96.616 kg (213 lb) 89.9 kg (198 lb 3.1 oz)    Exam:  General: NAD, looks comfortable Cardiovascular: irregular Respiratory: Bilateral crackles Abdomen: soft, non tender, no distention , bowel sounds normal Musculoskeletal: 1+ edema b/l   Data Reviewed: Basic Metabolic Panel:  Recent Labs Lab 09/12/15 0237 09/13/15 0445  NA 137 140  K 3.5 4.1  CL 98* 103  CO2 24 25  GLUCOSE 389* 284*  BUN 32* 32*  CREATININE 1.77* 1.41*  CALCIUM 8.9 8.4*   Liver Function Tests:  Recent Labs Lab 09/12/15 0237 09/13/15 0445  AST 25 23  ALT 19 16*  ALKPHOS 82 56  BILITOT 0.9 1.5*  PROT 7.9 6.8  ALBUMIN 4.4 3.7   CBC:  Recent Labs Lab 09/12/15 0237 09/13/15 0445  WBC 29.9* 19.3*  NEUTROABS 17.2*  --   HGB 14.4 12.3*  HCT 42.9 36.7*  MCV 94.5 91.5  PLT 322 276   Cardiac Enzymes:  Recent Labs Lab 09/12/15 0832 09/12/15 1429 09/12/15 1946  TROPONINI 0.57* 0.98* 0.94*   BNP (last 3 results)  Recent Labs  08/29/15 2230 09/12/15 0237  BNP 304.0* 408.0*    CBG:  Recent Labs Lab 09/12/15 0801 09/12/15 1203 09/12/15 1626 09/12/15 2120  GLUCAP 341* 271* 314* 343*    Recent Results (from the past 240 hour(s))  Blood Culture (routine x 2)     Status: None (Preliminary result)   Collection Time: 09/12/15  2:44 AM  Result Value Ref Range Status   Specimen Description BLOOD RIGHT ANTECUBITAL  Final   Special Requests BOTTLES DRAWN AEROBIC  AND ANAEROBIC 7CC EACH  Final   Culture NO GROWTH 1 DAY  Final   Report Status PENDING  Incomplete  Blood Culture (routine x 2)     Status: None (Preliminary result)   Collection Time: 09/12/15  3:00 AM  Result Value Ref Range Status   Specimen Description BLOOD LEFT ANTECUBITAL  Final   Special Requests   Final    BOTTLES DRAWN AEROBIC AND ANAEROBIC AEB=12CC ANA=10CC   Culture NO GROWTH 1 DAY  Final   Report Status PENDING  Incomplete     Studies: Dg Chest Portable 1 View  09/12/2015  CLINICAL DATA:  Acute  onset of shortness of breath. Initial encounter. EXAM: PORTABLE CHEST 1 VIEW COMPARISON:  Chest radiograph performed earlier today at 2:37 a.m. FINDINGS: The lungs are well-aerated. Vascular congestion is noted, with diffusely increased interstitial markings, compatible with pulmonary edema. A small right pleural effusion is noted. No pneumothorax is seen. The cardiomediastinal silhouette is mildly enlarged. The patient is status post median sternotomy, with evidence of prior CABG. No acute osseous abnormalities are seen. IMPRESSION: Vascular congestion and mild cardiomegaly, with diffusely increased interstitial markings, compatible with pulmonary edema. This is mildly worsened from the prior study. Small right pleural effusion noted. Electronically Signed   By: Roanna Raider M.D.   On: 09/12/2015 06:20   Dg Chest Port 1 View  09/12/2015  CLINICAL DATA:  Acute onset of severe shortness of breath. Decreased O2 saturation. Initial encounter. EXAM: PORTABLE CHEST 1 VIEW COMPARISON:  Chest radiograph from 08/29/2015 FINDINGS: The lungs are well-aerated. Vascular congestion is noted. Increased interstitial markings raise concern for mild pulmonary edema. There is no evidence of pleural effusion or pneumothorax. The cardiomediastinal silhouette is mildly enlarged. The patient is status post median sternotomy. No acute osseous abnormalities are seen. IMPRESSION: Vascular congestion and mild cardiomegaly. Increased interstitial markings raise concern for mild pulmonary edema. Electronically Signed   By: Roanna Raider M.D.   On: 09/12/2015 03:19    Scheduled Meds: . apixaban  5 mg Oral BID  . aspirin  81 mg Oral Daily  . aztreonam  1 g Intravenous Q8H  . diltiazem  360 mg Oral Daily  . furosemide  40 mg Intravenous Q12H  . furosemide  40 mg Intravenous Once  . insulin aspart  0-9 Units Subcutaneous TID WC  . metoprolol succinate  50 mg Oral BID  . potassium chloride SA  40 mEq Oral Daily  . pravastatin  40  mg Oral QHS  . vancomycin  1,250 mg Intravenous Q24H   Continuous Infusions: . sodium chloride 10 mL/hr at 09/12/15 1213  . nitroGLYCERIN 10 mcg/min (09/12/15 0601)    Active Problems:   Diabetes mellitus, type 2 (HCC)   Acute on chronic combined systolic and diastolic CHF (congestive heart failure) (HCC)   Atrial fibrillation (HCC)   CHF exacerbation (HCC)   Anticoagulant long-term use   Respiratory failure with hypoxia (HCC)   Acute respiratory failure (HCC)   Acute on chronic respiratory failure with hypoxia (HCC)    Time spent: 25 minutes   Erick Blinks, MD. [facility] Pager [phone]. If 7PM-7AM, please contact night-coverage at [email or web address], password [email or web address] 09/13/2015, 7:27 AM  LOS: 1 day      By signing my name below, I, Adron Bene, attest that this documentation has been prepared under the direction and in the presence of Erick Blinks, MD. Electronically Signed: Adron Bene  09/13/2015 9:31am  I, Dr. Erick Blinks, personally performed the services described in  this documentaiton. All medical record entries made by the scribe were at my direction and in my presence. I have reviewed the chart and agree that the record reflects my personal performance and is accurate and complete  Erick Blinks, MD, 09/13/2015 9:46 AM

## 2015-09-13 NOTE — Care Management Note (Signed)
Case Management Note  Patient Details  Name: Randy Nunez MRN: [id number] Date of Birth: 09-04-1940  Expected Discharge Date:  09/15/15               Expected Discharge Plan:  Home/Self Care  In-House Referral:  NA  Discharge planning Services  CM Consult  Post Acute Care Choice:    Choice offered to:     DME Arranged:    DME Agency:     HH Arranged:    HH Agency:     Status of Service:  Completed, signed off  Medicare Important Message Given:    Date Medicare IM Given:    Medicare IM give by:    Date Additional Medicare IM Given:    Additional Medicare Important Message give by:     If discussed at Long Length of Stay Meetings, dates discussed:    Additional Comments: Pt anticipates DC home over weekend. Per Pt's PCP office pt has no been ordered home O2 through them but had referred pt to [facility]. Pt is not homebound and will not qualify for [facility] services, this was discussed with pt. Pt will need home O2 assessment and if he qualifies would like home O2 through [facility] in [place].   Malcolm Metro, RN 09/13/2015, 3:01 PM

## 2015-09-13 NOTE — Patient Outreach (Signed)
[facility]) Care Management  09/13/2015  RISHAV MAYE Oct 25, 1940 [id number]   [place] referral:  Per EPIC review patient was admitted to [facility] 09/12/2015 room 11-ICCUP.  Plan; Advised hospital liaison.   Advised Clinical social worker who was active with patient. Will advise care management assistant.  Colleen Can, RN BSN CCM Care Management Coordinator [facility] Care Management  [phone]

## 2015-09-13 NOTE — Progress Notes (Signed)
Patients blood sugar 445. Dr. Kerry Hough notified via text page. Order put in for stat verification as per protocol

## 2015-09-13 NOTE — Care Management Important Message (Signed)
Important Message  Patient Details  Name: Randy Nunez MRN: [id number] Date of Birth: 1941/05/26   Medicare Important Message Given:  Yes    Malcolm Metro, RN 09/13/2015, 3:03 PM

## 2015-09-13 NOTE — Progress Notes (Signed)
Patients blood sugar continues to be greater than 400. Dr. Kerry Hough notified via text page.

## 2015-09-13 NOTE — Progress Notes (Signed)
Inpatient Diabetes Program Recommendations  AACE/ADA: New Consensus Statement on Inpatient Glycemic Control (2015)  Target Ranges:  Prepandial:   less than 140 mg/dL      Peak postprandial:   less than 180 mg/dL (1-2 hours)      Critically ill patients:  140 - 180 mg/dL  Results for Randy Nunez, Randy Nunez (MRN [id number]) as of 09/13/2015 09:22  Ref. Range 09/12/2015 08:01 09/12/2015 12:03 09/12/2015 16:26 09/12/2015 21:20 09/13/2015 07:35  Glucose-Capillary Latest Ref Range: 65-99 mg/dL 681 (H) 275 (H) 170 (H) 343 (H) 265 (H)   Results for IVIS, BERENDS (MRN [id number]) as of 09/13/2015 09:22  Ref. Range 09/12/2015 08:32  Hemoglobin A1C Latest Ref Range: 4.8-5.6 % 10.4 (H)   Review of Glycemic Control  Current orders for Inpatient glycemic control: Novolog 0-9 units TID with meals  Inpatient Diabetes Program Recommendations: Insulin - Basal: Please consider ordering low dose basal insuli; recommend starting Levemir 13 units daily (based on 89.9 kg x 0.15 units). Correction (SSI): Please consider ordering Novolog bedtime correction scale. HgbA1C: A1C 10.4% on 09/12/15 indicating an average glucose of 252 mg/dl over the past 2-3 months. Patient needs to follow up with doctor regarding glycemic control.  Thanks, Orlando Penner, RN, MSN, CDE Diabetes Coordinator Inpatient Diabetes Program [phone] (Team Pager from 8am to 5pm) [phone] ([facility] office) [phone] [facility] office) [phone] [facility] office)

## 2015-09-14 DIAGNOSIS — Z794 Long term (current) use of insulin: Secondary | ICD-10-CM

## 2015-09-14 LAB — GLUCOSE, CAPILLARY
GLUCOSE-CAPILLARY: 118 mg/dL — AB (ref 65–99)
GLUCOSE-CAPILLARY: 186 mg/dL — AB (ref 65–99)
Glucose-Capillary: 128 mg/dL — ABNORMAL HIGH (ref 65–99)
Glucose-Capillary: 86 mg/dL (ref 65–99)

## 2015-09-14 MED ORDER — INSULIN PEN NEEDLE 31G X 6 MM MISC: Status: AC

## 2015-09-14 MED ORDER — INSULIN ASPART 100 UNIT/ML FLEXPEN: 10.0000 [IU] | PEN_INJECTOR | Freq: Three times a day (TID) | SUBCUTANEOUS | Status: AC

## 2015-09-14 MED ORDER — TORSEMIDE 20 MG PO TABS: 40.0000 mg | ORAL_TABLET | Freq: Two times a day (BID) | ORAL | Status: AC

## 2015-09-14 MED ORDER — LEVOFLOXACIN 750 MG PO TABS: 750.0000 mg | ORAL_TABLET | Freq: Every day | ORAL | Status: DC

## 2015-09-14 MED ORDER — INSULIN DETEMIR 100 UNIT/ML FLEXPEN: 20.0000 [IU] | PEN_INJECTOR | Freq: Every day | SUBCUTANEOUS | Status: AC

## 2015-09-14 NOTE — Discharge Summary (Addendum)
Physician Discharge Summary  Randy Nunez BDZ:[id number] DOB: Dec 12, 1940 DOA: 09/12/2015  PCP: Randy Melena, MD  Admit date: 09/12/2015 Discharge date: 09/14/2015  Time spent: 35 minutes  Recommendations for Outpatient Follow-up:  1. Follow up with PCP in 1-2 weeks. 2. Follow up with Dr. Delton Nunez in 2 weeks.  3. Lasix changed to Torsemide.    Discharge Diagnoses:  Active Problems:   Diabetes mellitus, type 2 (HCC)   Acute on chronic combined systolic and diastolic CHF (congestive heart failure) (HCC)   Atrial fibrillation (HCC)   CHF exacerbation (HCC)   Anticoagulant long-term use   Respiratory failure with hypoxia (HCC)   Acute respiratory failure (HCC)   Acute on chronic respiratory failure with hypoxia (HCC) Sepsis due to HCAP, present on admission  Discharge Condition: Improved  Diet recommendation: Heart Healthy  Filed Weights   09/12/15 0651 09/13/15 2200 09/14/15 0400  Weight: 89.9 kg (198 lb 3.1 oz) 95.8 kg (211 lb 3.2 oz) 95.5 kg (210 lb 8.6 oz)    History of present illness:  51 yom with a hx of DM, HTN, CAD, atrial fibrillation, and chronic combined CHF presented with SOB and dyspnea. Patient was initially found to by hypoxic with an O2 sat of 58% on RA. He was placed on BIPAP in the ED. CXR showed pulmonary edema. Labs revealed a WBC 29 and lactic acid of 7. He was referred for admission.   Hospital Course:  Patient presented with acute hypoxic respiratory failure, secondary to pulmonary edema and HCAP. On admission patient required BIPAP due to an O2 sat of 54% on RA. After receiving IV abx and IV Lasix, he was able to transition to a nasal cannula. Since then, he has been weaned off supplemental O2 completely and is now breathing comfortably on RA.   1. HCAP. He was placed on IV abx on admission and will be transitioned to oral Levaquin on discharge. He is afebrile and clinically improved.  2. Sepsis. Patient was noted to have elevated lactic acid, wbc count, increase  respiratory rate and tachycardia which was felt to be related to sepsis. He was treated with antibiotics and was initially bolus fluids. He remained afebrile through hospital course and lactic acid trended down. Blood pressure remained stable and blood cultures showed no growth. 3. Acute on chronic combined CHF. ECHO from 08/30/15 shows EF of 35-40%. He was given IV Lasix with improvement and appears to be approaching euvolemia. Oral Lasix will be discontinued in favor of oral Torsemide upon discharge. He is on a BB. He is not a candidate for ACE-I due to renal dysfunction.  4. Elevated troponin, possibly from demand ischemia related to above. Troponin trending down. EKG does not indicate any acute changes. He denies any CP. 5. Atrial Fibrillation, chronic. Remained stable. Continue metroprolol and anticoagulation with eliquis 6. Diabetes mellitus type 2.  Patient required Levemir and Novolog for adequate control of blood sugars. Will discharge him on the same. Follow up with PCP for further management of diabetes.    Procedures:  none  Consultations:  none  Discharge Exam: Filed Vitals:   09/14/15 1000 09/14/15 1209  BP: 126/62   Pulse: 78   Temp:  97.8 F (36.6 C)  Resp: 14      General: NAD, looks comfortable  Cardiovascular: Irregularly irregular  Respiratory: clear bilaterally, No wheezing, rales or rhonchi  Abdomen: soft, non tender, no distention , bowel sounds normal  Musculoskeletal:  Trace edema b/l  Discharge Instructions   Discharge Instructions    (  HEART FAILURE PATIENTS) Call MD:  Anytime you have any of the following symptoms: 1) 3 pound weight gain in 24 hours or 5 pounds in 1 week 2) shortness of breath, with or without a dry hacking cough 3) swelling in the hands, feet or stomach 4) if you have to sleep on extra pillows at night in order to breathe.    Complete by:  As directed      Diet - low sodium heart healthy    Complete by:  As directed      Diet  Carb Modified    Complete by:  As directed      Increase activity slowly    Complete by:  As directed           Current Discharge Medication List    START taking these medications   Details  insulin aspart (NOVOLOG) 100 UNIT/ML FlexPen Inject 10 Units into the skin 3 (three) times daily with meals. Qty: 15 mL, Refills: 11    Insulin Detemir (LEVEMIR) 100 UNIT/ML Pen Inject 20 Units into the skin daily at 10 pm. Qty: 15 mL, Refills: 11    Insulin Pen Needle 31G X 6 MM MISC Use as directed Qty: 100 each, Refills: 0    levofloxacin (LEVAQUIN) 750 MG tablet Take 1 tablet (750 mg total) by mouth daily. Qty: 4 tablet, Refills: 0    torsemide (DEMADEX) 20 MG tablet Take 2 tablets (40 mg total) by mouth 2 (two) times daily. Qty: 120 tablet, Refills: 0      CONTINUE these medications which have NOT CHANGED   Details  ALPRAZolam (XANAX) 0.5 MG tablet Take 0.5 mg by mouth daily as needed for anxiety.    apixaban (ELIQUIS) 5 MG TABS tablet TAKE (1) TABLET TWICE DAILY. Qty: 60 tablet, Refills: 11    aspirin 81 MG chewable tablet Chew 1 tablet (81 mg total) by mouth daily.    calcium-vitamin D (OSCAL WITH D) 500-200 MG-UNIT per tablet Take 1 tablet by mouth daily.    diltiazem (CARDIZEM LA) 120 MG 24 hr tablet Take 3 tablets (360 mg total) by mouth daily. Qty: 90 tablet, Refills: 11    metoprolol succinate (TOPROL-XL) 50 MG 24 hr tablet Take 1 tablet (50 mg total) by mouth 2 (two) times daily. Take with or immediately following a meal. Qty: 60 tablet, Refills: 11    Multiple Vitamin (MULTIVITAMIN WITH MINERALS) TABS tablet Take 1 tablet by mouth daily.    potassium chloride SA (K-DUR,KLOR-CON) 20 MEQ tablet Take 2 tablets (40 mEq total) by mouth daily. Qty: 60 tablet, Refills: 11    pravastatin (PRAVACHOL) 40 MG tablet Take 1 tablet (40 mg total) by mouth at bedtime. Qty: 30 tablet, Refills: 11      STOP taking these medications     furosemide (LASIX) 40 MG tablet       glipiZIDE-metformin (METAGLIP) 5-500 MG per tablet      lisinopril (PRINIVIL,ZESTRIL) 10 MG tablet          The results of significant diagnostics from this hospitalization (including imaging, microbiology, ancillary and laboratory) are listed below for reference.    Significant Diagnostic Studies: Dg Chest Portable 1 View  09/12/2015  CLINICAL DATA:  Acute onset of shortness of breath. Initial encounter. EXAM: PORTABLE CHEST 1 VIEW COMPARISON:  Chest radiograph performed earlier today at 2:37 a.m. FINDINGS: The lungs are well-aerated. Vascular congestion is noted, with diffusely increased interstitial markings, compatible with pulmonary edema. A small right pleural effusion is  noted. No pneumothorax is seen. The cardiomediastinal silhouette is mildly enlarged. The patient is status post median sternotomy, with evidence of prior CABG. No acute osseous abnormalities are seen. IMPRESSION: Vascular congestion and mild cardiomegaly, with diffusely increased interstitial markings, compatible with pulmonary edema. This is mildly worsened from the prior study. Small right pleural effusion noted. Electronically Signed   By: Roanna Raider M.D.   On: 09/12/2015 06:20   Dg Chest Port 1 View  09/12/2015  CLINICAL DATA:  Acute onset of severe shortness of breath. Decreased O2 saturation. Initial encounter. EXAM: PORTABLE CHEST 1 VIEW COMPARISON:  Chest radiograph from 08/29/2015 FINDINGS: The lungs are well-aerated. Vascular congestion is noted. Increased interstitial markings raise concern for mild pulmonary edema. There is no evidence of pleural effusion or pneumothorax. The cardiomediastinal silhouette is mildly enlarged. The patient is status post median sternotomy. No acute osseous abnormalities are seen. IMPRESSION: Vascular congestion and mild cardiomegaly. Increased interstitial markings raise concern for mild pulmonary edema. Electronically Signed   By: Roanna Raider M.D.   On: 09/12/2015 03:19   Dg  Chest Port 1 View  08/29/2015  CLINICAL DATA:  Shortness of breath beginning approximately 1 hour ago. EXAM: PORTABLE CHEST 1 VIEW COMPARISON:  Single view of the chest 08/28/2013. FINDINGS: There is cardiomegaly. Increased airspace opacities are present in the mid and lower lung zones bilaterally likely due to pulmonary edema. No pneumothorax or pleural effusion. IMPRESSION: Findings most consistent with congestive heart failure. Electronically Signed   By: Drusilla Kanner M.D.   On: 08/29/2015 22:41    Microbiology: Recent Results (from the past 240 hour(s))  Blood Culture (routine x 2)     Status: None (Preliminary result)   Collection Time: 09/12/15  2:44 AM  Result Value Ref Range Status   Specimen Description BLOOD RIGHT ANTECUBITAL  Final   Special Requests BOTTLES DRAWN AEROBIC AND ANAEROBIC [facility] EACH  Final   Culture NO GROWTH 2 DAYS  Final   Report Status PENDING  Incomplete  Blood Culture (routine x 2)     Status: None (Preliminary result)   Collection Time: 09/12/15  3:00 AM  Result Value Ref Range Status   Specimen Description BLOOD LEFT ANTECUBITAL  Final   Special Requests   Final    BOTTLES DRAWN AEROBIC AND ANAEROBIC AEB=12CC ANA=10CC   Culture NO GROWTH 2 DAYS  Final   Report Status PENDING  Incomplete  Urine culture     Status: None   Collection Time: 09/12/15  3:15 AM  Result Value Ref Range Status   Specimen Description URINE, CATHETERIZED  Final   Special Requests NONE  Final   Culture   Final    NO GROWTH 1 DAY Performed at [facility]    Report Status 09/13/2015 FINAL  Final     Labs: Basic Metabolic Panel:  Recent Labs Lab 09/12/15 0237 09/13/15 0445 09/13/15 1136  NA 137 140  --   K 3.5 4.1  --   CL 98* 103  --   CO2 24 25  --   GLUCOSE 389* 284* 453*  BUN 32* 32*  --   CREATININE 1.77* 1.41*  --   CALCIUM 8.9 8.4*  --    Liver Function Tests:  Recent Labs Lab 09/12/15 0237 09/13/15 0445  AST 25 23  ALT 19 16*  ALKPHOS 82 56   BILITOT 0.9 1.5*  PROT 7.9 6.8  ALBUMIN 4.4 3.7   CBC:  Recent Labs Lab 09/12/15 0237 09/13/15 0445  WBC 29.9* 19.3*  NEUTROABS 17.2*  --   HGB 14.4 12.3*  HCT 42.9 36.7*  MCV 94.5 91.5  PLT 322 276   Cardiac Enzymes:  Recent Labs Lab 09/12/15 0832 09/12/15 1429 09/12/15 1946  TROPONINI 0.57* 0.98* 0.94*   BNP: BNP (last 3 results)  Recent Labs  08/29/15 2230 09/12/15 0237  BNP 304.0* 408.0*   CBG:  Recent Labs Lab 09/13/15 2009 09/14/15 0009 09/14/15 0543 09/14/15 0716 09/14/15 1137  GLUCAP 125* 86 128* 118* 186*     Signed: Nyshaun Standage. MD [facility] 09/14/2015, 2:36 PM   By signing my name below, I, Burnett Harry, attest that this documentation has been prepared under the direction and in the presence of [facility]. MD Electronically Signed: Burnett Harry, Scribe. 09/14/2015  I, Dr. Erick Blinks, personally performed the services described in this documentaiton. All medical record entries made by the scribe were at my direction and in my presence. I have reviewed the chart and agree that the record reflects my personal performance and is accurate and complete  Erick Blinks, MD, 09/14/2015 2:36 PM

## 2015-09-14 NOTE — Progress Notes (Signed)
Orders received for discharge. IVs removed. Prescriptions and instructions given to patient. Pt is going home on insulin which is new for him. RN demonstrated how to give insulin injections and explained the need to avoid the area around the umbilicus and to rotate sites. Pt did a return demonstration of injection and verbalized understanding about info on injections. Discharge instructions, including medications and follow up appointments were discussed with teach back. Heart failure education with teach back was done. Pt left via wheelchair with NT and all belongings in stable condition.

## 2015-09-14 NOTE — Progress Notes (Signed)
Pt weaned from 2L [place] to RA. sats remained in upper 90's. Pt also ambulated around the unit x2 on RA. O2 sats remained at 95%.

## 2015-09-17 ENCOUNTER — Other Ambulatory Visit: Payer: Self-pay | Admitting: *Deleted

## 2015-09-17 ENCOUNTER — Other Ambulatory Visit: Payer: Self-pay | Admitting: Licensed Clinical Social Worker

## 2015-09-17 ENCOUNTER — Encounter: Payer: Self-pay | Admitting: *Deleted

## 2015-09-17 LAB — CULTURE, BLOOD (ROUTINE X 2)
Culture: NO GROWTH
Culture: NO GROWTH

## 2015-09-17 NOTE — Patient Outreach (Signed)
Call to Dr. Scharlene Gloss office to schedule post hospital appointment. Scheduled for Thursday 09/19/15 4pm with French Ana, NP  Call to patient home, gave above information. Patient concerned about co-pay. RNCM encouraged patient to see MD for hospital follow up. Also, RNCM let patient know that [facility] LCSW would be calling regarding financial and Medicaid information and assistance.  Plan to call again next week and attempt home visit. Alben Spittle. [name], RN, BSN, CCM  [facility] [employer] [phone]

## 2015-09-17 NOTE — Patient Outreach (Addendum)
Call to patient for Transition of Care week #1  Subjective:  "I am doing much better"  Patent reports he was inpatient for pneumonia, states it was not HF this time. He is now on insulin for diabetes and off of his oral medications. Patient able to tell RNCM his medications and verbalize understanding of how to take insulin. He reports his blood sugar was 122 this am.  Patient verbalizes financial issues reporting he needs assistance with paying medical bills and asking about Medicaid. States he owes some hospital bills, also states issues with co-pays at the doctor offices.   Assessment: HF: Patient does weigh daily, reports weight down to 210#. Will educate patient on HF Diabetes: Now on insulin,patient able to verbalize how to use insulin Financial issues: will refer to [facility] LCSW for issues  Plan: Will call MD for appointment and call patient back Will inform [facility] LCSW,Scott Forrest of patient financial issues for his assessment Plan to continue transition of care calls, will attempt visit next week, patient does not want to schedule, states "just call me the day before" [facility] CM Care Plan Problem One        Most Recent Value   Care Plan Problem One  Risk for hospital admission for HF as evidenced by2 admits in 6 months   Role Documenting the Problem One  Care Management Coordinator   Care Plan for Problem One  Active   [facility] Long Term Goal (31-90 days)  Patient will not have any hospitalizations for HF in the next 60 days   [facility] Long Term Goal Start Date  09/17/15   Interventions for Problem One Long Term Goal  start transition of care program. Call MD office to get post hospital apointment   [facility] CM Short Term Goal #1 (0-30 days)  Patient will be able to verbalize understanding HF zones in the next 21 days   [facility] CM Short Term Goal #1 Start Date  09/17/15   Interventions for Short Term Goal #1  Transition of care program calls, education on HF symptoms to self monitor for given to patient      Alben Spittle. [name], RN, BSN, CCM  [facility] [employer] [phone]

## 2015-09-17 NOTE — Patient Outreach (Signed)
Assessment:  CSW called client on 09/17/15 and spoke via phone with client. CSW verified client identity. CSW and Kix spoke of client needs. Client spoke with CSW about financial needs of client.  CSW and Delron completed [facility] Financial Assessment Form for client on 09/17/15. Client said he went to [employer] in [place], [place] recently and was informed that he earned too much income to qualify for Medicaid.  He said he does have partial assistance through Medicaid in that Medicaid pays for cost of his medications.  He said he is planning to go to appointment with Dr. Margo Aye this week.  He said he has the money to pay copay at appointment with Dr.Hall this week.  RN Verdie Drown is providing [facility] nursing support for client..  CSW thanked Justn for phone call with CSW on 09/17/15.  Plan: Client will attend all scheduled client medical appointments in next 30 days. CSW to call client in 4 weeks to assess client needs.  [name].Mazella Deen MSW, LCSW Licensed Clinical Social Worker [facility] Care Management [phone]

## 2015-09-27 ENCOUNTER — Encounter: Payer: Self-pay | Admitting: *Deleted

## 2015-09-27 ENCOUNTER — Other Ambulatory Visit: Payer: Self-pay | Admitting: *Deleted

## 2015-09-27 NOTE — Patient Outreach (Signed)
[facility]) Care Management   09/27/2015  SHEROD CISSE 1941-01-28 [id number]  Randy Nunez is an 75 y.o. male  Subjective:  "I need help with my finances" " I had some chest tightness earlier, I did not take my medication on time" Reports it is better now. Patient reports he had to move his post hospital follow up appointment to tomorrow because of money, has appointment tomorrow morning.  He has cardiology appointment in March. Patient reports he has been to ED 4 times and 2 admissions in the past few months Patient reports little support as his family lives out of town, he does report he has some neighbors who could take to ED so he would not have to drive.  Patient does want to work but is limited because of where he lives states the apartment would take 30% of his income.  Objective:   Patient neatly groomed but home is cuttered BP 132/68 mmHg  Pulse 74  Resp 20  Ht 1.803 m ( )  Wt 210 lb (95.255 kg)  BMI 29.30 kg/m2  SpO2 97% Review of Systems  Constitutional: Negative.   HENT: Negative.   Respiratory: Positive for cough.        Occasional cough  Cardiovascular: Positive for leg swelling.  Gastrointestinal: Negative.        Occasional indigestion  Genitourinary: Positive for frequency.  Musculoskeletal: Positive for joint pain.       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   Neurological: Negative.   Psychiatric/Behavioral: Negative.     Physical Exam  Constitutional: He is oriented to person, place, and time. He appears well-developed and well-nourished.  HENT:  Head: Normocephalic.  Neck: Normal range of motion.  Cardiovascular: Normal rate.   irregular  Respiratory: Effort normal and breath sounds normal.  GI: Soft. Bowel sounds are normal.  Musculoskeletal: Normal range of motion.  Neurological: He is alert and oriented to person, place, and time.  Skin: Skin is warm and dry.    Current  Medications:   Current Outpatient Prescriptions  Medication Sig Dispense Refill  . ALPRAZolam (XANAX) 0.5 MG tablet Take 0.5 mg by mouth daily as needed for anxiety.    Marland Kitchen apixaban (ELIQUIS) 5 MG TABS tablet TAKE (1) TABLET TWICE DAILY. 60 tablet 11  . aspirin 81 MG chewable tablet Chew 1 tablet (81 mg total) by mouth daily.    . calcium-vitamin D (OSCAL WITH D) 500-200 MG-UNIT per tablet Take 1 tablet by mouth daily.    Marland Kitchen diltiazem (CARDIZEM LA) 120 MG 24 hr tablet Take 3 tablets (360 mg total) by mouth daily. 90 tablet 11  . insulin aspart (NOVOLOG) 100 UNIT/ML FlexPen Inject 10 Units into the skin 3 (three) times daily with meals. 15 mL 11  . Insulin Detemir (LEVEMIR) 100 UNIT/ML Pen Inject 20 Units into the skin daily at 10 pm. 15 mL 11  . metoprolol succinate (TOPROL-XL) 50 MG 24 hr tablet Take 1 tablet (50 mg total) by mouth 2 (two) times daily. Take with or immediately following a meal. 60 tablet 11  . Multiple Vitamin (MULTIVITAMIN WITH MINERALS) TABS tablet Take 1 tablet by mouth daily.    . potassium chloride SA (K-DUR,KLOR-CON) 20 MEQ tablet Take 2 tablets (40 mEq total) by mouth daily. 60 tablet 11  . pravastatin (PRAVACHOL) 40 MG tablet Take 1 tablet (40 mg total) by mouth at bedtime. 30 tablet 11  . torsemide (DEMADEX) 20 MG tablet  Take 2 tablets (40 mg total) by mouth 2 (two) times daily. 120 tablet 0  . Insulin Pen Needle 31G X 6 MM MISC Use as directed (Patient not taking: Reported on 09/27/2015) 100 each 0  . levofloxacin (LEVAQUIN) 750 MG tablet Take 1 tablet (750 mg total) by mouth daily. (Patient not taking: Reported on 09/27/2015) 4 tablet 0   No current facility-administered medications for this visit.    Functional Status:   In your present state of health, do you have any difficulty performing the following activities: 09/27/2015 09/12/2015  Hearing? N N  Vision? N N  Difficulty concentrating or making decisions? N N  Walking or climbing stairs? N N  Dressing or bathing?  N N  Doing errands, shopping? N N  Preparing Food and eating ? N -  Using the Toilet? N -  In the past six months, have you accidently leaked urine? N -  Do you have problems with loss of bowel control? N -  Managing your Medications? N -  Managing your Finances? N -  Housekeeping or managing your Housekeeping? N -    Fall/Depression Screening:   Fall Risk  09/10/2015  Falls in the past year? No  Risk for fall due to : Impaired balance/gait;Impaired mobility    PHQ 2/9 Scores 09/10/2015  PHQ - 2 Score 0    Assessment:   HF- needs reinforcement of self monitoring and self management.  Diabetes- A1C is 10.4 Fiancial issues: [facility] LCSW involved for financial assessment and information on Medicaid Medication adherence-gave med box to assist with medication management  Plan:  [facility] CM Care Plan Problem One        Most Recent Value   Care Plan Problem One  Risk for hospital admission for HF as evidenced by2 admits in 6 months   Role Documenting the Problem One  Care Management Coordinator   Care Plan for Problem One  Active   [facility] Long Term Goal (31-90 days)  Patient will not have any hospitalizations for HF in the next 60 days   [facility] Long Term Goal Start Date  09/17/15   Interventions for Problem One Long Term Goal  Gave [facility] calendar and reviewed HF zones using teachback. Reveiwed daily weights and symptoms   [facility] CM Short Term Goal #1 (0-30 days)  Patient will be able to verbalize understanding HF zones in the next 21 days   [facility] CM Short Term Goal #1 Start Date  09/17/15   Interventions for Short Term Goal #1  Transition of care program calls, Reviewed HF zones in [facility] calendar,encouraged patient to self monitor     Patient agrees to call 911 if chest tightness or chest pain, patient will report his chest tightness to NP tomorrow and to cardiologist at next [facility] appointment Patient will try using Med box to assist with medication adherence. Plan to continue Transition of Care  program. Visit again in March  Georgio Hattabaugh E. [name], RN, BSN, CCM  [facility] [employer] [phone]

## 2015-10-03 ENCOUNTER — Other Ambulatory Visit: Payer: Self-pay | Admitting: *Deleted

## 2015-10-03 NOTE — Patient Outreach (Signed)
Transition of care call Spoke briefly with patient He states he is driving and to call back later. Plan to call back at a later time. Alben Spittle. [name], RN, BSN, CCM  [facility] [employer] [phone]

## 2015-10-04 NOTE — Patient Outreach (Signed)
Called patient back, no answer, unable to leave a message. Plan to attempt again next week for transition of care. Randy Nunez. [name], RN, BSN, CCM  [facility] [employer] [phone]

## 2015-10-11 ENCOUNTER — Encounter: Payer: Self-pay | Admitting: *Deleted

## 2015-10-11 ENCOUNTER — Other Ambulatory Visit: Payer: Self-pay | Admitting: *Deleted

## 2015-10-11 NOTE — Patient Outreach (Signed)
Call to patient for transition of Care program call.  Patient agreed to call but does not want to continue with RNCM calls or visits, stating he sees his Doctor regularly and that the doctor keeps "a good eye on me" Patient reporting he is doing well. He states his weight is stable at 209# today. His blood sugar was 180. He has appointment for labs and will have [place] drawn in April, then see primary care 4/24. Patient saw MD in February, no change to medications and MD told patient he was doing good.  Patient upcoming appointments Dr. Nelson-cardiologist 10/15/13 Labs 4/21 Dr. [place] Crane 4/24  Assessent: HF: daily weights stable "209-210 pounds" Diabetes: patient scheduled for labs in April to check A1C   Plan: [place] Problem One        Most Recent Value   Care Plan Problem One  Risk for hospital admission for HF as evidenced by2 admits in 6 months   Role Documenting the Problem One  Care Management [place] for Problem One  Active   [facility] Long Term Goal (31-90 days)  Patient will not have any hospitalizations for HF in the next 60 days   [facility] Long Term Goal Start Date  09/17/15   Interventions for Problem One Long Term Goal  using teachback, referred patient to HF zones for HF symptom self monitoring   [facility] CM Short Term Goal #1 (0-30 days)  Patient will be able to verbalize understanding HF zones in the next 21 days   [facility] CM Short Term Goal #1 Start Date  09/17/15   [facility] CM Short Term Goal #1 Met Date  10/11/15 [Patient ended [facility] RN services 10/11/15]   Interventions for Short Term Goal #1  Transition of care program calls, Reviewed HF zones in [facility] calendar,encouraged patient to self monitor     RNCM Will close [facility] CM community case per patient request RNCM Will send letter to patient and MD regarding case closure RNCM will let [facility] CSW know of RNCM case closure, RNCM reminded patient that [facility] CSW was still active with case and had call scheduled for next  week.  Royetta Crochet. Laymond Purser, RN, BSN, [place] [phone]

## 2015-10-15 ENCOUNTER — Other Ambulatory Visit: Payer: Self-pay | Admitting: Licensed Clinical Social Worker

## 2015-10-15 NOTE — Patient Outreach (Signed)
Assessment:  CSW called home phone number of client on 10/15/15.  CSW spoke with client via phone on 10/15/15. CSW verified client identity. CSW and client spoke of client needs.Client said he has appointment with cardiologist in [place], [place] (Dr. Delton See) on 10/16/15. Client still has financial challenges. Client said he is still waiting to receive medical bill from [facility].  Client said he is eating adequately. He said he has his prescribed medications and is taking medications as prescribed. He said that friends form church brought food to him recently.   He is driving himself to needed medical appointments.  He said he will see Dr. Margo Aye for a medical appointment on  November 22, 2015.  He and CSW spoke of client care plan. Client agreed to care plan goal of his attending all scheduled client medical appointments in next 30 days.CSW offered to speak more with client about financial challenges for client.  CSW encouraged client to call CSW at [phone] to discuss financial needs of client. Client said he appreciated CSW phone call on 10/15/15.    Plan:  Client will attend all scheduled client medical appointments in next 30 days. CSW to call client in three weeks to assess client needs.  Randy Nunez.Randy Nunez MSW, LCSW Licensed Clinical Social Worker [facility] Care Management [phone]

## 2015-10-16 ENCOUNTER — Ambulatory Visit (INDEPENDENT_AMBULATORY_CARE_PROVIDER_SITE_OTHER): Payer: PPO | Admitting: Pharmacist

## 2015-10-16 NOTE — Progress Notes (Signed)
Pt was started on Eliquis 5mg  twice a day for Afib on 08/2013 by Dr. Delton See.    Reviewed patients medication list.  Pt is not currently on any combined P-gp and strong CYP3A4 inhibitors/inducers (ketoconazole, traconazole, ritonavir, carbamazepine, phenytoin, rifampin, St. John's wort).  Reviewed labs.  SCr-1.41, Hgb-12.3 and HCT-36.7, and Weight-213 lb.  Dose is appropriate based on dosing criteria.    A full discussion of the nature of anticoagulants has been carried out.  A benefit/risk analysis has been presented to the patient, so that they understand the justification for choosing anticoagulation with Eliquis at this time.  The need for compliance is stressed.  Pt is aware to take the medication twice daily.  Side effects of potential bleeding are discussed, including unusual colored urine or stools, coughing up blood or coffee ground emesis, nose bleeds or serious fall or head trauma.  Discussed signs and symptoms of stroke. The patient should avoid any OTC items containing aspirin or ibuprofen.  Avoid alcohol consumption.   Call if any signs of abnormal bleeding.  Discussed financial obligations and resolved any difficulty in obtaining medication.  Pt states his copay is $9 per month.  He is overdue for follow up with Dr. Delton See.  Appt made today at her next available, which is at the end of April.  He would like to switch to the [place] office after that since it is close to his home.

## 2015-11-03 ENCOUNTER — Emergency Department (HOSPITAL_COMMUNITY): Payer: PPO

## 2015-11-03 ENCOUNTER — Inpatient Hospital Stay (HOSPITAL_COMMUNITY): Payer: PPO

## 2015-11-03 ENCOUNTER — Inpatient Hospital Stay (HOSPITAL_COMMUNITY)
Admission: EM | Admit: 2015-11-03 | Discharge: 2015-12-02 | DRG: 207 | Disposition: E | Payer: PPO | Attending: Emergency Medicine | Admitting: Emergency Medicine

## 2015-11-03 ENCOUNTER — Encounter (HOSPITAL_COMMUNITY): Payer: Self-pay

## 2015-11-03 DIAGNOSIS — I249 Acute ischemic heart disease, unspecified: Secondary | ICD-10-CM | POA: Diagnosis present

## 2015-11-03 DIAGNOSIS — I509 Heart failure, unspecified: Secondary | ICD-10-CM

## 2015-11-03 DIAGNOSIS — E872 Acidosis, unspecified: Secondary | ICD-10-CM

## 2015-11-03 DIAGNOSIS — I5043 Acute on chronic combined systolic (congestive) and diastolic (congestive) heart failure: Secondary | ICD-10-CM | POA: Diagnosis present

## 2015-11-03 DIAGNOSIS — J9601 Acute respiratory failure with hypoxia: Principal | ICD-10-CM | POA: Diagnosis present

## 2015-11-03 DIAGNOSIS — G931 Anoxic brain damage, not elsewhere classified: Secondary | ICD-10-CM | POA: Diagnosis present

## 2015-11-03 DIAGNOSIS — Z951 Presence of aortocoronary bypass graft: Secondary | ICD-10-CM

## 2015-11-03 DIAGNOSIS — Z794 Long term (current) use of insulin: Secondary | ICD-10-CM | POA: Diagnosis not present

## 2015-11-03 DIAGNOSIS — I482 Chronic atrial fibrillation: Secondary | ICD-10-CM | POA: Diagnosis present

## 2015-11-03 DIAGNOSIS — R092 Respiratory arrest: Secondary | ICD-10-CM

## 2015-11-03 DIAGNOSIS — I480 Paroxysmal atrial fibrillation: Secondary | ICD-10-CM | POA: Diagnosis not present

## 2015-11-03 DIAGNOSIS — I469 Cardiac arrest, cause unspecified: Secondary | ICD-10-CM | POA: Diagnosis present

## 2015-11-03 DIAGNOSIS — E876 Hypokalemia: Secondary | ICD-10-CM | POA: Diagnosis not present

## 2015-11-03 DIAGNOSIS — N179 Acute kidney failure, unspecified: Secondary | ICD-10-CM | POA: Diagnosis present

## 2015-11-03 DIAGNOSIS — Y95 Nosocomial condition: Secondary | ICD-10-CM | POA: Diagnosis present

## 2015-11-03 DIAGNOSIS — E875 Hyperkalemia: Secondary | ICD-10-CM | POA: Diagnosis not present

## 2015-11-03 DIAGNOSIS — N189 Chronic kidney disease, unspecified: Secondary | ICD-10-CM | POA: Diagnosis present

## 2015-11-03 DIAGNOSIS — Z515 Encounter for palliative care: Secondary | ICD-10-CM | POA: Diagnosis present

## 2015-11-03 DIAGNOSIS — I110 Hypertensive heart disease with heart failure: Secondary | ICD-10-CM | POA: Diagnosis present

## 2015-11-03 DIAGNOSIS — Z7982 Long term (current) use of aspirin: Secondary | ICD-10-CM

## 2015-11-03 DIAGNOSIS — Z66 Do not resuscitate: Secondary | ICD-10-CM | POA: Diagnosis not present

## 2015-11-03 DIAGNOSIS — Z880 Allergy status to penicillin: Secondary | ICD-10-CM | POA: Diagnosis not present

## 2015-11-03 DIAGNOSIS — J9690 Respiratory failure, unspecified, unspecified whether with hypoxia or hypercapnia: Secondary | ICD-10-CM

## 2015-11-03 DIAGNOSIS — R4020 Unspecified coma: Secondary | ICD-10-CM | POA: Diagnosis present

## 2015-11-03 DIAGNOSIS — F419 Anxiety disorder, unspecified: Secondary | ICD-10-CM | POA: Diagnosis not present

## 2015-11-03 DIAGNOSIS — Z7901 Long term (current) use of anticoagulants: Secondary | ICD-10-CM

## 2015-11-03 DIAGNOSIS — E1122 Type 2 diabetes mellitus with diabetic chronic kidney disease: Secondary | ICD-10-CM | POA: Diagnosis not present

## 2015-11-03 DIAGNOSIS — E1165 Type 2 diabetes mellitus with hyperglycemia: Secondary | ICD-10-CM | POA: Diagnosis present

## 2015-11-03 DIAGNOSIS — L8990 Pressure ulcer of unspecified site, unspecified stage: Secondary | ICD-10-CM | POA: Diagnosis present

## 2015-11-03 DIAGNOSIS — I5041 Acute combined systolic (congestive) and diastolic (congestive) heart failure: Secondary | ICD-10-CM | POA: Diagnosis not present

## 2015-11-03 DIAGNOSIS — J811 Chronic pulmonary edema: Secondary | ICD-10-CM | POA: Diagnosis present

## 2015-11-03 DIAGNOSIS — I214 Non-ST elevation (NSTEMI) myocardial infarction: Secondary | ICD-10-CM | POA: Diagnosis present

## 2015-11-03 DIAGNOSIS — I10 Essential (primary) hypertension: Secondary | ICD-10-CM | POA: Diagnosis not present

## 2015-11-03 DIAGNOSIS — E870 Hyperosmolality and hypernatremia: Secondary | ICD-10-CM | POA: Diagnosis present

## 2015-11-03 DIAGNOSIS — Z9289 Personal history of other medical treatment: Secondary | ICD-10-CM

## 2015-11-03 DIAGNOSIS — J9600 Acute respiratory failure, unspecified whether with hypoxia or hypercapnia: Secondary | ICD-10-CM

## 2015-11-03 DIAGNOSIS — I5021 Acute systolic (congestive) heart failure: Secondary | ICD-10-CM | POA: Diagnosis not present

## 2015-11-03 DIAGNOSIS — I130 Hypertensive heart and chronic kidney disease with heart failure and stage 1 through stage 4 chronic kidney disease, or unspecified chronic kidney disease: Secondary | ICD-10-CM | POA: Diagnosis not present

## 2015-11-03 DIAGNOSIS — I4891 Unspecified atrial fibrillation: Secondary | ICD-10-CM | POA: Diagnosis present

## 2015-11-03 DIAGNOSIS — Z79899 Other long term (current) drug therapy: Secondary | ICD-10-CM

## 2015-11-03 DIAGNOSIS — R739 Hyperglycemia, unspecified: Secondary | ICD-10-CM

## 2015-11-03 DIAGNOSIS — I452 Bifascicular block: Secondary | ICD-10-CM | POA: Diagnosis present

## 2015-11-03 DIAGNOSIS — I2510 Atherosclerotic heart disease of native coronary artery without angina pectoris: Secondary | ICD-10-CM | POA: Diagnosis not present

## 2015-11-03 DIAGNOSIS — J810 Acute pulmonary edema: Secondary | ICD-10-CM | POA: Diagnosis not present

## 2015-11-03 LAB — I-STAT CHEM 8, ED
BUN: 45 mg/dL — AB (ref 6–20)
CALCIUM ION: 1.05 mmol/L — AB (ref 1.13–1.30)
CHLORIDE: 96 mmol/L — AB (ref 101–111)
Creatinine, Ser: 1.4 mg/dL — ABNORMAL HIGH (ref 0.61–1.24)
GLUCOSE: 571 mg/dL — AB (ref 65–99)
HCT: 56 % — ABNORMAL HIGH (ref 39.0–52.0)
Hemoglobin: 19 g/dL — ABNORMAL HIGH (ref 13.0–17.0)
Potassium: 4.2 mmol/L (ref 3.5–5.1)
Sodium: 135 mmol/L (ref 135–145)
TCO2: 23 mmol/L (ref 0–100)

## 2015-11-03 LAB — CBC WITH DIFFERENTIAL/PLATELET
Basophils Absolute: 0.2 10*3/uL — ABNORMAL HIGH (ref 0.0–0.1)
Basophils Relative: 1 %
Eosinophils Absolute: 1.8 10*3/uL — ABNORMAL HIGH (ref 0.0–0.7)
Eosinophils Relative: 7 %
HCT: 49.4 % (ref 39.0–52.0)
Hemoglobin: 16.5 g/dL (ref 13.0–17.0)
Lymphocytes Relative: 53 %
Lymphs Abs: 14.2 10*3/uL — ABNORMAL HIGH (ref 0.7–4.0)
MCH: 31.1 pg (ref 26.0–34.0)
MCHC: 33.4 g/dL (ref 30.0–36.0)
MCV: 93 fL (ref 78.0–100.0)
Monocytes Absolute: 1.3 10*3/uL — ABNORMAL HIGH (ref 0.1–1.0)
Monocytes Relative: 5 %
Neutro Abs: 9.4 10*3/uL — ABNORMAL HIGH (ref 1.7–7.7)
Neutrophils Relative %: 35 %
Platelets: 298 10*3/uL (ref 150–400)
RBC: 5.31 MIL/uL (ref 4.22–5.81)
RDW: 13.7 % (ref 11.5–15.5)
WBC: 26.8 10*3/uL — ABNORMAL HIGH (ref 4.0–10.5)

## 2015-11-03 LAB — BLOOD GAS, ARTERIAL
ACID-BASE EXCESS: 6.3 mmol/L — AB (ref 0.0–2.0)
BICARBONATE: 18.3 meq/L — AB (ref 20.0–24.0)
DRAWN BY: 221791
FIO2: 100
LHR: 14 {breaths}/min
O2 SAT: 96.8 %
PEEP/CPAP: 5 cmH2O
PH ART: 7.221 — AB (ref 7.350–7.450)
Patient temperature: 37.6
TCO2: 21.1 mmol/L (ref 0–100)
VT: 550 mL
pCO2 arterial: 50.6 mmHg — ABNORMAL HIGH (ref 35.0–45.0)
pO2, Arterial: 116 mmHg — ABNORMAL HIGH (ref 80.0–100.0)

## 2015-11-03 LAB — BRAIN NATRIURETIC PEPTIDE: B Natriuretic Peptide: 618 pg/mL — ABNORMAL HIGH (ref 0.0–100.0)

## 2015-11-03 LAB — POCT I-STAT 3, ART BLOOD GAS (G3+)
Bicarbonate: 26.1 mEq/L — ABNORMAL HIGH (ref 20.0–24.0)
O2 SAT: 95 %
PCO2 ART: 46.1 mmHg — AB (ref 35.0–45.0)
Patient temperature: 98.7
TCO2: 27 mmol/L (ref 0–100)
pH, Arterial: 7.36 (ref 7.350–7.450)
pO2, Arterial: 77 mmHg — ABNORMAL LOW (ref 80.0–100.0)

## 2015-11-03 LAB — I-STAT CG4 LACTIC ACID, ED
Lactic Acid, Venous: 12.36 mmol/L (ref 0.5–2.0)
Lactic Acid, Venous: 8.4 mmol/L (ref 0.5–2.0)

## 2015-11-03 LAB — COMPREHENSIVE METABOLIC PANEL
ALBUMIN: 4.1 g/dL (ref 3.5–5.0)
ALT: 80 U/L — ABNORMAL HIGH (ref 17–63)
ANION GAP: 19 — AB (ref 5–15)
AST: 140 U/L — ABNORMAL HIGH (ref 15–41)
Alkaline Phosphatase: 117 U/L (ref 38–126)
BILIRUBIN TOTAL: 0.8 mg/dL (ref 0.3–1.2)
BUN: 29 mg/dL — ABNORMAL HIGH (ref 6–20)
CO2: 20 mmol/L — AB (ref 22–32)
Calcium: 8.6 mg/dL — ABNORMAL LOW (ref 8.9–10.3)
Chloride: 94 mmol/L — ABNORMAL LOW (ref 101–111)
Creatinine, Ser: 1.65 mg/dL — ABNORMAL HIGH (ref 0.61–1.24)
GFR calc non Af Amer: 39 mL/min — ABNORMAL LOW (ref >60)
GFR, EST AFRICAN AMERICAN: 45 mL/min — AB (ref >60)
GLUCOSE: 578 mg/dL — AB (ref 65–99)
POTASSIUM: 3.3 mmol/L — AB (ref 3.5–5.1)
SODIUM: 133 mmol/L — AB (ref 135–145)
TOTAL PROTEIN: 7.6 g/dL (ref 6.5–8.1)

## 2015-11-03 LAB — I-STAT TROPONIN, ED: Troponin i, poc: 0 ng/mL (ref 0.00–0.08)

## 2015-11-03 LAB — TRIGLYCERIDES: TRIGLYCERIDES: 357 mg/dL — AB (ref <150)

## 2015-11-03 LAB — TROPONIN I
TROPONIN I: 0.03 ng/mL (ref <0.031)
TROPONIN I: 1.62 ng/mL — AB (ref <0.031)

## 2015-11-03 LAB — CBG MONITORING, ED
GLUCOSE-CAPILLARY: 450 mg/dL — AB (ref 65–99)
Glucose-Capillary: 545 mg/dL — ABNORMAL HIGH (ref 65–99)
Glucose-Capillary: 490 mg/dL — ABNORMAL HIGH (ref 65–99)

## 2015-11-03 LAB — LACTIC ACID, PLASMA: Lactic Acid, Venous: 2.6 mmol/L (ref 0.5–2.0)

## 2015-11-03 LAB — MRSA PCR SCREENING: MRSA BY PCR: NEGATIVE

## 2015-11-03 LAB — GLUCOSE, CAPILLARY: GLUCOSE-CAPILLARY: 378 mg/dL — AB (ref 65–99)

## 2015-11-03 MED ORDER — FAMOTIDINE IN NACL 20-0.9 MG/50ML-% IV SOLN
20.0000 mg | INTRAVENOUS | Status: DC
  Administered 2015-11-03 – 2015-11-05 (×3): 20 mg via INTRAVENOUS
  Filled 2015-11-03 (×3): qty 50

## 2015-11-03 MED ORDER — AMIODARONE HCL IN DEXTROSE 360-4.14 MG/200ML-% IV SOLN
30.0000 mg/h | INTRAVENOUS | Status: DC
Start: 2015-11-04 — End: 2015-11-10
  Administered 2015-11-03 – 2015-11-09 (×12): 30 mg/h via INTRAVENOUS
  Filled 2015-11-03 (×14): qty 200

## 2015-11-03 MED ORDER — FENTANYL CITRATE (PF) 100 MCG/2ML IJ SOLN
50.0000 ug | INTRAMUSCULAR | Status: DC | PRN
  Administered 2015-11-05 – 2015-11-08 (×13): 50 ug via INTRAVENOUS
  Filled 2015-11-03 (×14): qty 2

## 2015-11-03 MED ORDER — ONDANSETRON HCL 4 MG/2ML IJ SOLN: 4.0000 mg | Freq: Three times a day (TID) | INTRAMUSCULAR | Status: AC | PRN

## 2015-11-03 MED ORDER — ROCURONIUM BROMIDE 50 MG/5ML IV SOLN
INTRAVENOUS | Status: AC | PRN
  Administered 2015-11-03: 50 mg via INTRAVENOUS

## 2015-11-03 MED ORDER — INSULIN ASPART 100 UNIT/ML ~~LOC~~ SOLN
0.0000 [IU] | SUBCUTANEOUS | Status: DC
  Administered 2015-11-03: 15 [IU] via SUBCUTANEOUS

## 2015-11-03 MED ORDER — APIXABAN 5 MG PO TABS
5.0000 mg | ORAL_TABLET | Freq: Two times a day (BID) | ORAL | Status: DC
  Administered 2015-11-03: 5 mg via ORAL
  Filled 2015-11-03 (×3): qty 1

## 2015-11-03 MED ORDER — PROPOFOL 1000 MG/100ML IV EMUL
INTRAVENOUS | Status: AC
  Filled 2015-11-03: qty 100

## 2015-11-03 MED ORDER — SODIUM CHLORIDE 0.9 % IV SOLN
Freq: Once | INTRAVENOUS | Status: AC
  Administered 2015-11-03: 16:00:00 via INTRAVENOUS

## 2015-11-03 MED ORDER — METOPROLOL SUCCINATE ER 50 MG PO TB24
50.0000 mg | ORAL_TABLET | Freq: Every day | ORAL | Status: DC
  Administered 2015-11-05: 50 mg via ORAL
  Filled 2015-11-03: qty 1
  Filled 2015-11-03: qty 2

## 2015-11-03 MED ORDER — DOCUSATE SODIUM 50 MG/5ML PO LIQD
100.0000 mg | Freq: Two times a day (BID) | ORAL | Status: DC | PRN
  Administered 2015-11-11: 100 mg
  Filled 2015-11-03: qty 10

## 2015-11-03 MED ORDER — ETOMIDATE 2 MG/ML IV SOLN
INTRAVENOUS | Status: AC | PRN
  Administered 2015-11-03: 20 mg via INTRAVENOUS

## 2015-11-03 MED ORDER — FUROSEMIDE 10 MG/ML IJ SOLN
40.0000 mg | Freq: Once | INTRAMUSCULAR | Status: AC
  Administered 2015-11-03: 40 mg via INTRAVENOUS
  Filled 2015-11-03: qty 4

## 2015-11-03 MED ORDER — SODIUM CHLORIDE 0.9 % IV SOLN
250.0000 mL | INTRAVENOUS | Status: DC | PRN
  Administered 2015-11-11: 250 mL via INTRAVENOUS
  Administered 2015-11-12: 500 mL via INTRAVENOUS

## 2015-11-03 MED ORDER — PROPOFOL 1000 MG/100ML IV EMUL
5.0000 ug/kg/min | INTRAVENOUS | Status: DC
Start: 2015-11-03 — End: 2015-11-07
  Administered 2015-11-04: 10 ug/kg/min via INTRAVENOUS
  Administered 2015-11-04: 5 ug/kg/min via INTRAVENOUS
  Administered 2015-11-05: 8 ug/kg/min via INTRAVENOUS
  Administered 2015-11-05 – 2015-11-06 (×4): 30 ug/kg/min via INTRAVENOUS
  Administered 2015-11-06: 40 ug/kg/min via INTRAVENOUS
  Administered 2015-11-06: 30 ug/kg/min via INTRAVENOUS
  Administered 2015-11-07: 25 ug/kg/min via INTRAVENOUS
  Administered 2015-11-07 (×2): 30 ug/kg/min via INTRAVENOUS
  Filled 2015-11-03 (×16): qty 100

## 2015-11-03 MED ORDER — ANTISEPTIC ORAL RINSE SOLUTION (CORINZ)
7.0000 mL | Freq: Four times a day (QID) | OROMUCOSAL | Status: DC
  Administered 2015-11-04 – 2015-11-08 (×19): 7 mL via OROMUCOSAL

## 2015-11-03 MED ORDER — VANCOMYCIN HCL 10 G IV SOLR
2000.0000 mg | Freq: Once | INTRAVENOUS | Status: AC
  Administered 2015-11-03: 2000 mg via INTRAVENOUS
  Filled 2015-11-03: qty 2000

## 2015-11-03 MED ORDER — ASPIRIN 81 MG PO CHEW
81.0000 mg | CHEWABLE_TABLET | Freq: Every day | ORAL | Status: DC
  Administered 2015-11-04 – 2015-11-12 (×9): 81 mg
  Filled 2015-11-03 (×9): qty 1

## 2015-11-03 MED ORDER — PROPOFOL 1000 MG/100ML IV EMUL
INTRAVENOUS | Status: AC
  Administered 2015-11-03: 30 ug/kg/min via INTRAVENOUS
  Filled 2015-11-03: qty 100

## 2015-11-03 MED ORDER — INSULIN DETEMIR 100 UNIT/ML ~~LOC~~ SOLN
10.0000 [IU] | Freq: Every day | SUBCUTANEOUS | Status: DC
  Administered 2015-11-03: 10 [IU] via SUBCUTANEOUS
  Filled 2015-11-03: qty 0.1

## 2015-11-03 MED ORDER — FENTANYL CITRATE (PF) 100 MCG/2ML IJ SOLN
50.0000 ug | INTRAMUSCULAR | Status: AC | PRN
  Administered 2015-11-11 (×3): 50 ug via INTRAVENOUS

## 2015-11-03 MED ORDER — ALBUTEROL SULFATE (2.5 MG/3ML) 0.083% IN NEBU: 2.5000 mg | INHALATION_SOLUTION | RESPIRATORY_TRACT | Status: DC | PRN

## 2015-11-03 MED ORDER — PROPOFOL 1000 MG/100ML IV EMUL
5.0000 ug/kg/min | Freq: Once | INTRAVENOUS | Status: DC
  Administered 2015-11-03: 30 ug/kg/min via INTRAVENOUS

## 2015-11-03 MED ORDER — APIXABAN 5 MG PO TABS
5.0000 mg | ORAL_TABLET | Freq: Two times a day (BID) | ORAL | Status: DC
  Filled 2015-11-03: qty 1

## 2015-11-03 MED ORDER — VANCOMYCIN HCL IN DEXTROSE 750-5 MG/150ML-% IV SOLN
750.0000 mg | Freq: Two times a day (BID) | INTRAVENOUS | Status: DC
  Administered 2015-11-04: 750 mg via INTRAVENOUS
  Filled 2015-11-03 (×2): qty 150

## 2015-11-03 MED ORDER — DEXTROSE 5 % IV SOLN
1.0000 g | Freq: Three times a day (TID) | INTRAVENOUS | Status: DC
  Administered 2015-11-03 – 2015-11-04 (×2): 1 g via INTRAVENOUS
  Filled 2015-11-03 (×4): qty 1

## 2015-11-03 MED ORDER — SODIUM CHLORIDE 0.9 % IV SOLN
INTRAVENOUS | Status: DC
  Administered 2015-11-03: 18:00:00 via INTRAVENOUS

## 2015-11-03 MED ORDER — CHLORHEXIDINE GLUCONATE 0.12% ORAL RINSE (MEDLINE KIT)
15.0000 mL | Freq: Two times a day (BID) | OROMUCOSAL | Status: DC
  Administered 2015-11-03 – 2015-11-08 (×10): 15 mL via OROMUCOSAL

## 2015-11-03 MED ORDER — INSULIN ASPART 100 UNIT/ML ~~LOC~~ SOLN
10.0000 [IU] | Freq: Once | SUBCUTANEOUS | Status: AC
  Administered 2015-11-03: 10 [IU] via SUBCUTANEOUS
  Filled 2015-11-03: qty 1

## 2015-11-03 MED ORDER — DILTIAZEM HCL ER COATED BEADS 120 MG PO TB24
360.0000 mg | ORAL_TABLET | Freq: Every day | ORAL | Status: DC
  Filled 2015-11-03: qty 3

## 2015-11-03 MED ORDER — AMIODARONE HCL IN DEXTROSE 360-4.14 MG/200ML-% IV SOLN
60.0000 mg/h | INTRAVENOUS | Status: AC
  Administered 2015-11-03: 60 mg/h via INTRAVENOUS
  Filled 2015-11-03: qty 200

## 2015-11-03 MED ORDER — PROPOFOL 1000 MG/100ML IV EMUL
30.0000 ug/kg/min | Freq: Once | INTRAVENOUS | Status: DC
  Administered 2015-11-03: 30 ug/kg/min via INTRAVENOUS

## 2015-11-03 MED ORDER — LEVOFLOXACIN IN D5W 750 MG/150ML IV SOLN
750.0000 mg | INTRAVENOUS | Status: DC
Start: 2015-11-03 — End: 2015-11-06
  Administered 2015-11-03 – 2015-11-05 (×3): 750 mg via INTRAVENOUS
  Filled 2015-11-03 (×4): qty 150

## 2015-11-03 MED ORDER — DEXTROSE 5 % IV SOLN
150.0000 mg | INTRAVENOUS | Status: AC | PRN
  Administered 2015-11-03: 150 mg via INTRAVENOUS

## 2015-11-03 NOTE — ED Notes (Signed)
CRITICAL VALUE ALERT  Critical value received:  Glucose 571 Date of notification:  Nov 19, 2015  Time of notification:  1550  Critical value read back:Yes.    Nurse who received alert:  [facility] RN  MD notified (1st page):  Dr. Hyacinth Meeker  Time of first page:  1550  MD notified (2nd page):  Time of second page:  Responding MD:  Dr. Hyacinth Meeker   Time MD responded:  1550

## 2015-11-03 NOTE — ED Notes (Signed)
CRITICAL VALUE ALERT  Critical value received:  Lactic acid 12.36  Date of notification:  11-25-15  Time of notification:  1556  Critical value read back:Yes.    Nurse who received alert:  [facility]  MD notified (1st page):  1556  Time of first page:  1556  MD notified (2nd page):  Time of second page:  Responding MD:  Dr. Hyacinth Meeker   Time MD responded:  1556

## 2015-11-03 NOTE — ED Notes (Signed)
CRITICAL VALUE ALERT  Critical value received:  Lactic acid 8.4 Date of notification:  25-Nov-2015  Time of notification:  1634  Critical value read back:Yes.    Nurse who received alert:  [facility] RN  MD notified (1st page):  Dr. Hyacinth Meeker   Time of first page:  1630  MD notified (2nd page):  Time of second page:  Responding MD:  Dr. Hyacinth Meeker   Time MD responded:  1630

## 2015-11-03 NOTE — H&P (Signed)
PULMONARY / CRITICAL CARE MEDICINE  HISTORY AND PHYSICAL   Name: Randy Nunez MRN: [id number] DOB: [birth year]    ADMISSION DATE:  2015-11-24  CHIEF COMPLAINT:  Respiratory failure and cardiac arrest  HISTORY OF PRESENT ILLNESS:   Randy Nunez is a 75 y.o. male with diabetes mellitus, hypertenstion, CAD s/p CABG, chronic atrial fibrillation and HFrEF who presented to the emergency department with shortness of breath earlier today. History is obtained by reports and chart review as his family who is present in our ICU was not with him during these events.  Randy Nunez reported severe shortness of breath after eating this afternoon with the feeling of being "full" after being normal this morning. He eventually started sweating and asked his neighbor in his assisted care living center to take him to the ED for breathing difficulties. He was conversant when he arrived in the ED, but after transfer to the ED room, he was noted to be in extremis, ashen colored and lost his pulse. CPR was initiated for PEA cardiac arrest and the patient had ROSC after intubation without the need for ACLS medications or shocks. Immediately after intubation he was noted to have an irregular wide complex tachycardia and was given a bolus of amiodarone followed by an infusion. It was noted he had pink frothy sputum in his ETT. He was then transferred to [facility] ICU.     PAST MEDICAL HISTORY :  He  has a past medical history of Diabetes mellitus without complication (HCC); Hypertension; CAD (coronary artery disease); Atrial fibrillation (HCC); Chronic combined systolic and diastolic CHF (congestive heart failure) (HCC); and Acute respiratory failure (HCC).  PAST SURGICAL HISTORY: He  has past surgical history that includes Left heart cath (2015); Coronary artery bypass graft (2001); and left heart catheterization with coronary angiogram (N/A, 08/21/2013).  Allergies  Allergen Reactions  . Penicillins     No current  facility-administered medications on file prior to encounter.   Current Outpatient Prescriptions on File Prior to Encounter  Medication Sig  . ALPRAZolam (XANAX) 0.5 MG tablet Take 0.5 mg by mouth daily as needed for anxiety.  Marland Kitchen apixaban (ELIQUIS) 5 MG TABS tablet TAKE (1) TABLET TWICE DAILY.  Marland Kitchen aspirin 81 MG chewable tablet Chew 1 tablet (81 mg total) by mouth daily.  . calcium-vitamin D (OSCAL WITH D) 500-200 MG-UNIT per tablet Take 1 tablet by mouth daily.  Marland Kitchen diltiazem (CARDIZEM LA) 120 MG 24 hr tablet Take 3 tablets (360 mg total) by mouth daily.  . insulin aspart (NOVOLOG) 100 UNIT/ML FlexPen Inject 10 Units into the skin 3 (three) times daily with meals.  . Insulin Detemir (LEVEMIR) 100 UNIT/ML Pen Inject 20 Units into the skin daily at 10 pm.  . Insulin Pen Needle 31G X 6 MM MISC Use as directed (Patient not taking: Reported on 09/27/2015)  . metoprolol succinate (TOPROL-XL) 50 MG 24 hr tablet Take 1 tablet (50 mg total) by mouth 2 (two) times daily. Take with or immediately following a meal.  . Multiple Vitamin (MULTIVITAMIN WITH MINERALS) TABS tablet Take 1 tablet by mouth daily.  . potassium chloride SA (K-DUR,KLOR-CON) 20 MEQ tablet Take 2 tablets (40 mEq total) by mouth daily.  . pravastatin (PRAVACHOL) 40 MG tablet Take 1 tablet (40 mg total) by mouth at bedtime.  . torsemide (DEMADEX) 20 MG tablet Take 2 tablets (40 mg total) by mouth 2 (two) times daily.    FAMILY HISTORY:  His indicated that his mother is deceased. He indicated  that his father is deceased. He indicated that his sister is alive. He indicated that his brother is alive. He indicated that his daughter is alive. He indicated that his other is alive.   SOCIAL HISTORY: He  reports that he has never smoked. He does not have any smokeless tobacco history on file. He reports that he does not drink alcohol or use illicit drugs.  REVIEW OF SYSTEMS:   Review of systems is unable to be obtained as the patient is sedated  and intubated.   VITAL SIGNS: BP 140/97 mmHg  Pulse 53  Temp(Src) 98.7 F (37.1 C) (Core (Comment))  Resp 22  Wt 94.802 kg (209 lb)  SpO2 100%  VENTILATOR SETTINGS: Vent Mode:  [-] PRVC FiO2 (%):  [40 %-100 %] 40 % Set Rate:  [14 bmp-16 bmp] 16 bmp Vt Set:  [550 mL] 550 mL PEEP:  [5 cmH20-8 cmH20] 8 cmH20 Plateau Pressure:  [20 cmH20-22 cmH20] 20 cmH20  PHYSICAL EXAMINATION: General:  Sedated, moving extremities spontaneously, not interactive, intubated Neuro:  Low tone in all extremities, no clonus or hyperreflexia, pupils ~79mm bilaterally and reactive, no facial droop noted HEENT:  Moist mucous membranes, clear tongue, ETT in place through mouth Cardiovascular:  Irregularly irregular, no murmurs, previous sternotomy scar noted Lungs: Crackles present bilaterally, particularly in the bases Abdomen:  Obese, soft, nontender, no appreciable hepatosplenomegaly Musculoskeletal:  Warm extremities, 2+ pitting edema in legs Skin:  Hyperpigmented skin in feet  LABS:  BMET  Recent Labs Lab 11-07-15 1545 07-Nov-2015 1550  NA 133* 135  K 3.3* 4.2  CL 94* 96*  CO2 20*  --   BUN 29* 45*  CREATININE 1.65* 1.40*  GLUCOSE 578* 571*    Electrolytes  Recent Labs Lab November 07, 2015 1545  CALCIUM 8.6*    CBC  Recent Labs Lab 11-07-2015 1545 Nov 07, 2015 1550  WBC 26.8*  --   HGB 16.5 19.0*  HCT 49.4 56.0*  PLT 298  --     Coag's No results for input(s): APTT, INR in the last 168 hours.  Sepsis Markers  Recent Labs Lab 2015/11/07 1556 11/07/2015 1630  LATICACIDVEN 12.36* 8.40*    ABG  Recent Labs Lab 2015-11-07 1620  PHART 7.221*  PCO2ART 50.6*  PO2ART 116*    Liver Enzymes  Recent Labs Lab 2015/11/07 1545  AST 140*  ALT 80*  ALKPHOS 117  BILITOT 0.8  ALBUMIN 4.1    Cardiac Enzymes  Recent Labs Lab November 07, 2015 1545  TROPONINI 0.03    Glucose  Recent Labs Lab 11/07/2015 1543 November 07, 2015 1646 11-07-2015 1745  GLUCAP 545* 490* 450*    Imaging Dg Chest  Portable 1 View  11/07/15  CLINICAL DATA:  Acute respiratory failure.  Central line placement. EXAM: PORTABLE CHEST 1 VIEW COMPARISON:  11-07-2015 FINDINGS: The endotracheal tube and NG tubes are stable. New right IJ central venous catheter tip is in the distal SVC near the cavoatrial junction. No complicating features. Persistent cardiac enlargement and diffuse pulmonary edema. IMPRESSION: Right IJ center venous catheter tip is in the distal SVC. The endotracheal tube and NG tubes are stable. Persistent pulmonary edema. Electronically Signed   By: Rudie Meyer M.D.   On: Nov 07, 2015 17:39   Dg Chest Portable 1 View  11-07-15  CLINICAL DATA:  Sudden shortness of breath, diaphoretic. EXAM: PORTABLE CHEST 1 VIEW COMPARISON:  Chest x-rays dated 09/12/2015 and 08/22/2013. FINDINGS: Cardiomegaly is stable. Median sternotomy wires are stable in alignment. Endotracheal tube appears well positioned with tip just above the  level of the carina. Enteric tube passes below the diaphragm. There is central pulmonary vascular congestion and bilateral interstitial edema suggesting volume overload/ CHF. No pleural effusion or pneumothorax seen. IMPRESSION: 1. Cardiomegaly with central pulmonary vascular congestion and bilateral interstitial edema indicating CHF/volume overload. 2. Endotracheal tube well positioned with tip approximately 2 cm above the level of the carina. Electronically Signed   By: Bary Richard M.D.   On: 11-04-15 15:57     LINES/TUBES: Right Internal jugular CVC Foley catheter OG tube  DISCUSSION: Randy Nunez is a 75 y.o. male with diabetes mellitus, hypertenstion, CAD s/p CABG, chronic atrial fibrillation and HFrEF who presented to the emergency department with shortness of breath earlier today with subsequent PEA cardiac arrest and intubation.   His clinical picture is very similar to his presentation in Feb 2017 of acute on chronic congestive heart failure exacerbation with evidence of  hypervolemia on examination, elevated BNP (408 2/9 --> 619 today), and pulmonary edema on CXR. Due to lack of history, we have to consider alternatives as well. Healthcare associated pneumonia is a possibility with his elevated white count and pulmonary findings and will be covered for while we get culture data; admittedly my suspicion is low as he tends to have all these findings during his heart failure exacerbations. Pulmonary embolism is also possible especially with acute onset dyspnea, however, he is on apixiban chronically for atrial fibrillation and after intubation does not have a large A:a gradient which would be expected from a massive PE. Acute coronary event is possible in light of his severe CAD and we will trend troponins.   ASSESSMENT / PLAN:  PULMONARY A: Likely acute pulmonary edema from heart failure. Will consider HCAP. Less likely PE.  P:   - Continue to wean from mechanical ventilator. FiO2 came down quickly with PEEP 8 - VAP protective protocol initiated  CARDIOVASCULAR A:  Acute on chronic congestive heart failure exacerbation with PEA arrest (likely respiratory etiology). Will also check for acute coronary event or NSTEMI. Currently in chronic atrial fibrillation (narrow complex) and not tachycardic.  P:  - Continue amiodarone overnight - One time dose of Lasix 40mg  IV tonight - Plan to DC if stable after his cardaic arrest with conversion to home metroprol and diltiazem in the morning - Will trend troponin I q6h x3 - Trend lactates  RENAL A:   Acute on chronic kidney failure from heart failure, creatinine already improving.  P:   - Urine analysis and culture  - Repeat labs and electrolytes tomorrow  GASTROINTESTINAL A:   No issues currently P:   - Has OG tube - NPO for now except meds  HEMATOLOGIC A:   Chronic anticoagulation for atrial fibrillation P:  - Continue home apixiban 5mg  BID  INFECTIOUS A:   Concern for healthcare associated  pneumonia P:   - Will send blood, urine and sputum cultures from tracheal aspirate - Will start vancomycin, aztreonam and levofloxacin as he has a listed penicillin allergy for 24-48 hour sepsis rule out  ENDOCRINE A:   Diabetes mellitus, on insulin P:   - Cover with sliding scale while NPO  NEUROLOGIC A:   S/p Cardiac arrest Sedated on mechanical ventilatory P:   - RASS goal: 0 to -1 - Will wean propofol and use fentanyl with intention of waking him up to assess neuro status    FAMILY  - Updates: Updated his two sisters, two daughters and their spouses at bedside. Will continue FULL CODE for now  after discussion with family; but it is noted that he stated on 09/12/15 that he did not want to be placed on the mechanical ventilator (DNR/DNI) to Dr Ross Marcus. Will readdress with patient once he has capacity.    Pulmonary and Critical Care Medicine [facility] Pager: [phone]  11/20/2015, 8:07 PM

## 2015-11-03 NOTE — ED Provider Notes (Signed)
CSN: [id number]     Arrival date & time 11/08/2015  1524 History   First MD Initiated Contact with Patient 11/19/2015 1537     Chief Complaint  Patient presents with  . Shortness of Breath     (Consider location/radiation/quality/duration/timing/severity/associated sxs/prior Treatment) HPI Comments: The patient is a 75 year old male, he has a history of diabetes, heart disease, congestive heart failure, atrial fibrillation, history of acute respiratory failure secondary to pulmonary edema. He presents to the hospital by private private vehicle of a friend who lives at the same assisted care facility who reports that he came to his door, knocked on the door and asked for help because he couldn't breathe. The friend reports he did the same thing several months ago. On arrival the patient was gasping for breath, he was ashen in color, he was placed immediately in room by a nurse at which time he had a cardiac arrest with loss of pulses and respiratory arrest as well.  Patient is a 75 y.o. male presenting with shortness of breath. The history is provided by a friend and medical records.  Shortness of Breath   Past Medical History  Diagnosis Date  . Diabetes mellitus without complication (HCC)     x  8 yrs.  . Hypertension   . CAD (coronary artery disease)     a. s/p CABG 2001. b. NSTEMI 08/2013 - cath done, med rx recommended.  . Atrial fibrillation (HCC)     a. Dx 08/2013. Placed on eliquis. Plan DCCV as outpt. Also has had WCT c/w aberrancy.  . Chronic combined systolic and diastolic CHF (congestive heart failure) (HCC)   . Acute respiratory failure (HCC)     a. 08/2013 - VDRF due to pulm edema.   Past Surgical History  Procedure Laterality Date  . Left heart cath  2015    Dr Allyson Sabal, with stenting  . Coronary artery bypass graft  2001  . Left heart catheterization with coronary angiogram N/A 08/21/2013    Procedure: LEFT HEART CATHETERIZATION WITH CORONARY ANGIOGRAM;  Surgeon: Runell Gess, MD;  Location: [facility] CATH LAB;  Service: Cardiovascular;  Laterality: N/A;   Family History  Problem Relation Age of Onset  . Hypertension    . Hyperlipidemia    . Alzheimer's disease Mother   . Heart attack Father    Social History  Substance Use Topics  . Smoking status: Never Smoker   . Smokeless tobacco: None  . Alcohol Use: No    Review of Systems  Unable to perform ROS: Acuity of condition  Respiratory: Positive for shortness of breath.       Allergies  Penicillins  Home Medications   Prior to Admission medications   Medication Sig Start Date End Date Taking? Authorizing Provider  ALPRAZolam Prudy Feeler) 0.5 MG tablet Take 0.5 mg by mouth daily as needed for anxiety.    Historical Provider, MD  apixaban (ELIQUIS) 5 MG TABS tablet TAKE (1) TABLET TWICE DAILY. 10/01/14   Lars Masson, MD  aspirin 81 MG chewable tablet Chew 1 tablet (81 mg total) by mouth daily. 08/23/13   Lonia Blood, MD  calcium-vitamin D (OSCAL WITH D) 500-200 MG-UNIT per tablet Take 1 tablet by mouth daily.    Historical Provider, MD  diltiazem (CARDIZEM LA) 120 MG 24 hr tablet Take 3 tablets (360 mg total) by mouth daily. 10/01/14   Lars Masson, MD  insulin aspart (NOVOLOG) 100 UNIT/ML FlexPen Inject 10 Units into the skin 3 (three)  times daily with meals. 09/14/15   Erick Blinks, MD  Insulin Detemir (LEVEMIR) 100 UNIT/ML Pen Inject 20 Units into the skin daily at 10 pm. 09/14/15   Erick Blinks, MD  Insulin Pen Needle 31G X 6 MM MISC Use as directed Patient not taking: Reported on 09/27/2015 09/14/15   Erick Blinks, MD  metoprolol succinate (TOPROL-XL) 50 MG 24 hr tablet Take 1 tablet (50 mg total) by mouth 2 (two) times daily. Take with or immediately following a meal. 10/01/14   Lars Masson, MD  Multiple Vitamin (MULTIVITAMIN WITH MINERALS) TABS tablet Take 1 tablet by mouth daily.    Historical Provider, MD  potassium chloride SA (K-DUR,KLOR-CON) 20 MEQ tablet Take 2 tablets (40  mEq total) by mouth daily. 10/01/14   Lars Masson, MD  pravastatin (PRAVACHOL) 40 MG tablet Take 1 tablet (40 mg total) by mouth at bedtime. 10/01/14   Lars Masson, MD  torsemide (DEMADEX) 20 MG tablet Take 2 tablets (40 mg total) by mouth 2 (two) times daily. 09/14/15   Erick Blinks, MD   BP 114/72 mmHg  Pulse 111  Temp(Src) 98.7 F (37.1 C) (Core (Comment))  Resp 14  Wt 209 lb (94.802 kg)  SpO2 95% Physical Exam  Constitutional:  Pale, diaphoretic  HENT:  Atraumatic, oropharynx of frothy sputum  Eyes:  Pupils 2 mm equal and reactive bilaterally  Neck:  Supple neck, mild JVD present  Cardiovascular:  The patient appears to have electrical activity on the monitor but no pulses, appears to be wide complex and irregular  Pulmonary/Chest:  Respiratory arrest, no spontaneous breathing, requiring bag-valve-mask  Abdominal:  Soft, nondistended, no masses  Musculoskeletal:  Mild bilateral lower extremity edema symmetrical  Neurological:  GCS of 3, does not respond to verbal or painful stimuli  Skin:  Pale and diaphoretic    ED Course  .Intubation Date/Time: 2015-11-29 3:40 PM Performed by: Eber Hong Authorized by: Eber Hong Consent: The procedure was performed in an emergent situation. Time out: Immediately prior to procedure a "time out" was called to verify the correct patient, procedure, equipment, support staff and site/side marked as required. Indications: respiratory failure Intubation method: direct Patient status: unconscious Preoxygenation: BVM Pretreatment medications: none Laryngoscope size: Mac 4 Tube size: 8.0 mm Tube type: cuffed Number of attempts: 1 Cricoid pressure: no Cords visualized: yes Post-procedure assessment: chest rise and CO2 detector Breath sounds: equal and absent over the epigastrium Cuff inflated: yes Tube secured with: ETT holder Chest x-ray interpreted by me. Chest x-ray findings: endotracheal tube in appropriate  position Patient tolerance: Patient tolerated the procedure well with no immediate complications  OG placement Date/Time: 11-29-2015 3:40 PM Performed by: Eber Hong Authorized by: Eber Hong Consent: The procedure was performed in an emergent situation. Required items: required blood products, implants, devices, and special equipment available Time out: Immediately prior to procedure a "time out" was called to verify the correct patient, procedure, equipment, support staff and site/side marked as required. Preparation: Patient was prepped and draped in the usual sterile fashion. Local anesthesia used: no Patient sedated: no Patient tolerance: Patient tolerated the procedure well with no immediate complications Comments: OG placed by myself  .Central Line Performed by: Eber Hong Authorized by: Eber Hong Consent: The procedure was performed in an emergent situation. Required items: required blood products, implants, devices, and special equipment available Patient identity confirmed: arm band Time out: Immediately prior to procedure a "time out" was called to verify the correct patient, procedure, equipment, support staff  and site/side marked as required. Indications: vascular access and central pressure monitoring Preparation: skin prepped with ChloraPrep Skin prep agent dried: skin prep agent completely dried prior to procedure Sterile barriers: all five maximum sterile barriers used - cap, mask, sterile gown, sterile gloves, and large sterile sheet Hand hygiene: hand hygiene performed prior to central venous catheter insertion Location details: right internal jugular Patient position: flat Catheter type: triple lumen Pre-procedure: landmarks identified Ultrasound guidance: yes Sterile ultrasound techniques: sterile gel and sterile probe covers were used Number of attempts: 1 Successful placement: yes Post-procedure: line sutured and dressing applied Assessment:  blood return through all ports,  free fluid flow,  placement verified by x-ray and no pneumothorax on x-ray Patient tolerance: Patient tolerated the procedure well with no immediate complications   (including critical care time) Labs Review Labs Reviewed  COMPREHENSIVE METABOLIC PANEL - Abnormal; Notable for the following:    Sodium 133 (*)    Potassium 3.3 (*)    Chloride 94 (*)    CO2 20 (*)    Glucose, Bld 578 (*)    BUN 29 (*)    Creatinine, Ser 1.65 (*)    Calcium 8.6 (*)    AST 140 (*)    ALT 80 (*)    GFR calc non Af Amer 39 (*)    GFR calc Af Amer 45 (*)    Anion gap 19 (*)    All other components within normal limits  BRAIN NATRIURETIC PEPTIDE - Abnormal; Notable for the following:    B Natriuretic Peptide 618.0 (*)    All other components within normal limits  CBC WITH DIFFERENTIAL/PLATELET - Abnormal; Notable for the following:    WBC 26.8 (*)    Neutro Abs 9.4 (*)    Lymphs Abs 14.2 (*)    Monocytes Absolute 1.3 (*)    Eosinophils Absolute 1.8 (*)    Basophils Absolute 0.2 (*)    All other components within normal limits  BLOOD GAS, ARTERIAL - Abnormal; Notable for the following:    pH, Arterial 7.221 (*)    pCO2 arterial 50.6 (*)    pO2, Arterial 116 (*)    Bicarbonate 18.3 (*)    Acid-Base Excess 6.3 (*)    All other components within normal limits  CBG MONITORING, ED - Abnormal; Notable for the following:    Glucose-Capillary 545 (*)    All other components within normal limits  I-STAT CHEM 8, ED - Abnormal; Notable for the following:    Chloride 96 (*)    BUN 45 (*)    Creatinine, Ser 1.40 (*)    Glucose, Bld 571 (*)    Calcium, Ion 1.05 (*)    Hemoglobin 19.0 (*)    HCT 56.0 (*)    All other components within normal limits  I-STAT CG4 LACTIC ACID, ED - Abnormal; Notable for the following:    Lactic Acid, Venous 12.36 (*)    All other components within normal limits  I-STAT CG4 LACTIC ACID, ED - Abnormal; Notable for the following:    Lactic  Acid, Venous 8.40 (*)    All other components within normal limits  CBG MONITORING, ED - Abnormal; Notable for the following:    Glucose-Capillary 490 (*)    All other components within normal limits  CBG MONITORING, ED - Abnormal; Notable for the following:    Glucose-Capillary 450 (*)    All other components within normal limits  URINE CULTURE  CULTURE, BLOOD (ROUTINE X 2)  CULTURE,  BLOOD (ROUTINE X 2)  TROPONIN I  URINALYSIS, ROUTINE W REFLEX MICROSCOPIC (NOT AT [facility])  PATHOLOGIST SMEAR REVIEW  Rosezena Sensor, ED    Imaging Review Dg Chest Portable 1 View  11/19/2015  CLINICAL DATA:  Acute respiratory failure.  Central line placement. EXAM: PORTABLE CHEST 1 VIEW COMPARISON:  11/19/2015 FINDINGS: The endotracheal tube and NG tubes are stable. New right IJ central venous catheter tip is in the distal SVC near the cavoatrial junction. No complicating features. Persistent cardiac enlargement and diffuse pulmonary edema. IMPRESSION: Right IJ center venous catheter tip is in the distal SVC. The endotracheal tube and NG tubes are stable. Persistent pulmonary edema. Electronically Signed   By: Rudie Meyer M.D.   On: 2015/11/19 17:39   Dg Chest Portable 1 View  11-19-2015  CLINICAL DATA:  Sudden shortness of breath, diaphoretic. EXAM: PORTABLE CHEST 1 VIEW COMPARISON:  Chest x-rays dated 09/12/2015 and 08/22/2013. FINDINGS: Cardiomegaly is stable. Median sternotomy wires are stable in alignment. Endotracheal tube appears well positioned with tip just above the level of the carina. Enteric tube passes below the diaphragm. There is central pulmonary vascular congestion and bilateral interstitial edema suggesting volume overload/ CHF. No pleural effusion or pneumothorax seen. IMPRESSION: 1. Cardiomegaly with central pulmonary vascular congestion and bilateral interstitial edema indicating CHF/volume overload. 2. Endotracheal tube well positioned with tip approximately 2 cm above the level of the  carina. Electronically Signed   By: Bary Richard M.D.   On: 2015-11-19 15:57   I have personally reviewed and evaluated these images and lab results as part of my medical decision-making.   EKG Interpretation   Date/Time:  Sunday 11/19/15 15:42:47 EDT Ventricular Rate:  108 PR Interval:    QRS Duration: 194 QT Interval:  413 QTC Calculation: 554 R Axis:   -86 Text Interpretation:  Atrial fibrillation Right bundle branch block new  RBBB Abnormal ekg Confirmed by Placido Hangartner  MD, Kaci Freel ([id number]) on 11/19/2015  4:03:44 PM      EKG Interpretation  Date/Time:  Sunday 11-19-2015 16:08:26 EDT Ventricular Rate:  97 PR Interval:    QRS Duration: 113 QT Interval:  391 QTC Calculation: 497 R Axis:   -86 Text Interpretation:  Atrial fibrillation Ventricular premature complex Incomplete RBBB and LAFB Abnormal R-wave progression, late transition Nonspecific repol abnormality, lateral leads Abnormal ekg Since last tracing Wide complex rhythm has been replaced with IVCD ST abnormalities are new Confirmed by Aaliayah Miao  MD, Dayvon Dax ([id number]) on 11-19-2015 4:12:07 PM        MDM   Final diagnoses:  Cardiac arrest (HCC)  Respiratory arrest (HCC)  Lactic acidosis  Hyperglycemia    IV access obtained immediately upon arrival by nursing staff, the patient had a abnormal rhythm on the monitor which was bradycardic, irregular and wide complex initially. CPR was initiated immediately. I both performed and directed CPR. The patient had no return of spontaneous relation at that point however with intubation the patient did have return of spontaneous circulation prior to any other medications being given. He did have some gagging on the tube, some etomidate was given, pulses became strong, irregular consistent with atrial fibrillation and EKG showed what appeared to be wide complex right bundle branch block rhythm with multiple short runs of ventricular tachycardia. Amiodarone was given, propofol for sedation,  OG placed by myself, x-ray performed and shows endotracheal tube in the correct position, OG tube in the correct position, no significant infiltrates or pneumothorax.    Cardiopulmonary Resuscitation (CPR)  Procedure Note Directed/Performed by: [name] I personally directed ancillary staff and/or performed CPR in an effort to regain return of spontaneous circulation and to maintain cardiac, neuro and systemic perfusion.   Review of the medical record shows that the patient does have a history of a recent admission for sepsis from healthcare associated pneumonia approximately 2 months ago. He has a history of an echocardiogram 3 months ago showing an ejection fraction of 35-40%, has a history of coronary artery bypass grafting as well with multiple myocardial infarctions. He has a known diabetic  ICU paged. D/w Dr. Katrinka Blazing who has accepted pt to ICU D/w Dr. Gala Romney of Cardiology who will see pt as needed if ICU consults on patient arrival. Amio drip requested and ordered.  CRITICAL CARE Performed by: [name] Total critical care time: 35 minutes Critical care time was exclusive of separately billable procedures and treating other patients. Critical care was necessary to treat or prevent imminent or life-threatening deterioration. Critical care was time spent personally by me on the following activities: development of treatment plan with patient and/or surrogate as well as nursing, discussions with consultants, evaluation of patient's response to treatment, examination of patient, obtaining history from patient or surrogate, ordering and performing treatments and interventions, ordering and review of laboratory studies, ordering and review of radiographic studies, pulse oximetry and re-evaluation of patient's condition.   #1 cardiac arrest - likely secondary to respiratory failure, possibly secondary to primary cardiac source given history of underlying cardiac disease, obstructive  disease, arrhythmia and congestive heart failure. The patient was intubated, ventilated, oxygenated and had routine of spontaneous circulation prior to any medications though he did require approximately 5 minutes of high quality CPR without eruptions.  #2 respiratory arrest - this is possibly related to underlying congestive heart failure, potentially pneumonia as the patient was recently admitted for the same, he may have aspirated, he was ventilated, oxygenated and intubated and has had successful oxygenation since that time.  #3 lactic acidosis - the patient is a lactic acid of 12.3, he has been elevated in the past but this is significantly elevated, possibly related to prolonged underlying respiratory illness or his cardiac arrest.  #4 hyperglycemia - initial i-STAT metabolic panel  shows an increased anion gap of 21, his glucose is 571, his other electrolytes are okay and creatinine is 1.4. He will be given an insulin bolus and some IV fluids. He is not hypotensive.     Eber Hong, MD 11/22/2015 [phone]

## 2015-11-03 NOTE — Progress Notes (Signed)
Pharmacy Antibiotic Note  Randy Nunez is a 75 y.o. male admitted on 11/23/2015 with pneumonia.  Pharmacy has been consulted for vancomycin, levaquin and aztreonam dosing. Pt is afebrile and WBC is elevated at 26.8. Scr is 1.4 and lactic is elevated but trending down to 8.4.   Plan: - Vanc 2gm IV x 1 then 750mg  IV Q12H - Aztreonam 1gm IV Q8H (4 hr inf) - Levaquin 750mg  IV Q24H *Borderline doses, watch renal fxn closely - F/u renal fxn, C&S, clinical status and trough at SS  Weight: 209 lb (94.802 kg)  Temp (24hrs), Avg:98.7 F (37.1 C), Min:97.9 F (36.6 C), Max:99.1 F (37.3 C)   Recent Labs Lab 11/30/2015 1545 11/28/2015 1550 11/05/2015 1556 11/05/2015 1630  WBC 26.8*  --   --   --   CREATININE 1.65* 1.40*  --   --   LATICACIDVEN  --   --  12.36* 8.40*    Estimated Creatinine Clearance: 53.7 mL/min (by C-G formula based on Cr of 1.4).    Antimicrobials this admission: Vanc 4/2>> Levaquin 4/2>> Aztreo 4/2>>  Dose adjustments this admission: N/A  Microbiology results: 4/2 Blood - NGTD  Thank you for allowing pharmacy to be a part of this patient's care.  Randy Nunez, Randy Nunez 11/11/2015 8:11 PM

## 2015-11-03 NOTE — Progress Notes (Signed)
CRITICAL VALUE ALERT  Critical value received:  Lactic Acid  Date of notification:  11/13/2015   Time of notification:  2045  Critical value read back:Yes.    Nurse who received alert:  Ernie Hew, RN; Wilhelmina Mcardle, RN  MD notified (1st page):  E-link MD  Time of first page:  2100  MD notified (2nd page):  Time of second page:  Responding MD:  Dr. Katrinka Blazing  Time MD responded:  2100 No action required

## 2015-11-03 NOTE — Progress Notes (Signed)
CRITICAL VALUE ALERT  Critical value received:  Troponin = 1.62  Date of notification:  12-02-15  Time of notification:  11:13 PM  Critical value read back:Yes.    Nurse who received alert:  Wilhelmina Mcardle, RN  MD notified (1st page):  E link MD  Time of first page:  11:13 PM  MD notified (2nd page):  Time of second page:  Responding MD:  Dr. Tyson Alias   Time MD responded:  11:16 PM

## 2015-11-03 NOTE — ED Notes (Signed)
Patient having intermittent brief runs of V-Tach lasting 2-5 seconds and quickly converts back into A-fib. Dr Hyacinth Meeker made aware.

## 2015-11-03 NOTE — ED Notes (Signed)
CRITICAL VALUE ALERT  Critical value received:  Glucose 578  Date of notification:  2015/11/10  Time of notification:  1620  Critical value read back:Yes.    Nurse who received alert:  Tarri Glenn RN   MD notified (1st page):  Dr Hyacinth Meeker  Time of first page:  1620  MD notified (2nd page):  Time of second page:  Responding MD:  Dr Hyacinth Meeker  Time MD responded:  1620

## 2015-11-03 NOTE — ED Notes (Signed)
Pt reports suddenly became SOB after eating.  Pt sounds like is very full.  Pt denies coughing or vomiting while eating.  Pt denies pain.  Reports felt normal this morning.  Pt diaphoretic.

## 2015-11-03 NOTE — Progress Notes (Signed)
eLink Physician-Brief Progress Note Patient Name: Randy Nunez DOB: 09-May-1941 MRN: [id number]   Date of Service  11/12/2015  HPI/Events of Note  Had pea arrest Trop 1.6 Plat wnl Has cad ecg without st changes  eICU Interventions  Get coags Consider empiric asa adduition Repeat trop May need echo     Intervention Category Major Interventions: Arrhythmia - evaluation and management  FEINSTEIN,DANIEL J. 11/11/2015, 11:17 PM

## 2015-11-04 ENCOUNTER — Inpatient Hospital Stay (HOSPITAL_COMMUNITY): Payer: PPO

## 2015-11-04 DIAGNOSIS — R092 Respiratory arrest: Secondary | ICD-10-CM

## 2015-11-04 DIAGNOSIS — I4891 Unspecified atrial fibrillation: Secondary | ICD-10-CM

## 2015-11-04 DIAGNOSIS — I480 Paroxysmal atrial fibrillation: Secondary | ICD-10-CM

## 2015-11-04 DIAGNOSIS — I10 Essential (primary) hypertension: Secondary | ICD-10-CM

## 2015-11-04 DIAGNOSIS — I214 Non-ST elevation (NSTEMI) myocardial infarction: Secondary | ICD-10-CM

## 2015-11-04 DIAGNOSIS — I5041 Acute combined systolic (congestive) and diastolic (congestive) heart failure: Secondary | ICD-10-CM

## 2015-11-04 DIAGNOSIS — I469 Cardiac arrest, cause unspecified: Secondary | ICD-10-CM

## 2015-11-04 DIAGNOSIS — I5021 Acute systolic (congestive) heart failure: Secondary | ICD-10-CM

## 2015-11-04 LAB — GLUCOSE, CAPILLARY
GLUCOSE-CAPILLARY: 188 mg/dL — AB (ref 65–99)
GLUCOSE-CAPILLARY: 165 mg/dL — AB (ref 65–99)
GLUCOSE-CAPILLARY: 148 mg/dL — AB (ref 65–99)
GLUCOSE-CAPILLARY: 139 mg/dL — AB (ref 65–99)
GLUCOSE-CAPILLARY: 85 mg/dL (ref 65–99)
GLUCOSE-CAPILLARY: 96 mg/dL (ref 65–99)
GLUCOSE-CAPILLARY: 119 mg/dL — AB (ref 65–99)
GLUCOSE-CAPILLARY: 116 mg/dL — AB (ref 65–99)
Glucose-Capillary: 101 mg/dL — ABNORMAL HIGH (ref 65–99)
Glucose-Capillary: 114 mg/dL — ABNORMAL HIGH (ref 65–99)
Glucose-Capillary: 147 mg/dL — ABNORMAL HIGH (ref 65–99)
Glucose-Capillary: 173 mg/dL — ABNORMAL HIGH (ref 65–99)
Glucose-Capillary: 181 mg/dL — ABNORMAL HIGH (ref 65–99)
Glucose-Capillary: 191 mg/dL — ABNORMAL HIGH (ref 65–99)
Glucose-Capillary: 191 mg/dL — ABNORMAL HIGH (ref 65–99)
Glucose-Capillary: 204 mg/dL — ABNORMAL HIGH (ref 65–99)
Glucose-Capillary: 262 mg/dL — ABNORMAL HIGH (ref 65–99)
Glucose-Capillary: 287 mg/dL — ABNORMAL HIGH (ref 65–99)
Glucose-Capillary: 311 mg/dL — ABNORMAL HIGH (ref 65–99)
Glucose-Capillary: 86 mg/dL (ref 65–99)

## 2015-11-04 LAB — BLOOD GAS, VENOUS

## 2015-11-04 LAB — PROTIME-INR
INR: 1.22 (ref 0.00–1.49)
PROTHROMBIN TIME: 15.5 s — AB (ref 11.6–15.2)

## 2015-11-04 LAB — CBC WITH DIFFERENTIAL/PLATELET
BASOS ABS: 0.1 10*3/uL (ref 0.0–0.1)
BASOS PCT: 0 %
EOS PCT: 4 %
Eosinophils Absolute: 0.7 10*3/uL (ref 0.0–0.7)
HCT: 40.9 % (ref 39.0–52.0)
Hemoglobin: 13.8 g/dL (ref 13.0–17.0)
LYMPHS PCT: 10 %
Lymphs Abs: 1.6 10*3/uL (ref 0.7–4.0)
MCH: 30.3 pg (ref 26.0–34.0)
MCHC: 33.7 g/dL (ref 30.0–36.0)
MCV: 89.7 fL (ref 78.0–100.0)
MONO ABS: 1 10*3/uL (ref 0.1–1.0)
MONOS PCT: 6 %
Neutro Abs: 13.6 10*3/uL — ABNORMAL HIGH (ref 1.7–7.7)
Neutrophils Relative %: 80 %
PLATELETS: 212 10*3/uL (ref 150–400)
RBC: 4.56 MIL/uL (ref 4.22–5.81)
RDW: 13.9 % (ref 11.5–15.5)
WBC: 17 10*3/uL — ABNORMAL HIGH (ref 4.0–10.5)

## 2015-11-04 LAB — PATHOLOGIST SMEAR REVIEW

## 2015-11-04 LAB — HEPARIN LEVEL (UNFRACTIONATED): Heparin Unfractionated: 1.74 IU/mL — ABNORMAL HIGH (ref 0.30–0.70)

## 2015-11-04 LAB — COMPREHENSIVE METABOLIC PANEL
ALT: 70 U/L — ABNORMAL HIGH (ref 17–63)
ANION GAP: 10 (ref 5–15)
AST: 94 U/L — AB (ref 15–41)
Albumin: 3 g/dL — ABNORMAL LOW (ref 3.5–5.0)
Alkaline Phosphatase: 76 U/L (ref 38–126)
BILIRUBIN TOTAL: 0.9 mg/dL (ref 0.3–1.2)
BUN: 24 mg/dL — AB (ref 6–20)
CHLORIDE: 104 mmol/L (ref 101–111)
CO2: 28 mmol/L (ref 22–32)
Calcium: 8.1 mg/dL — ABNORMAL LOW (ref 8.9–10.3)
Creatinine, Ser: 1.4 mg/dL — ABNORMAL HIGH (ref 0.61–1.24)
GFR calc Af Amer: 55 mL/min — ABNORMAL LOW (ref >60)
GFR, EST NON AFRICAN AMERICAN: 47 mL/min — AB (ref >60)
Glucose, Bld: 205 mg/dL — ABNORMAL HIGH (ref 65–99)
POTASSIUM: 3.1 mmol/L — AB (ref 3.5–5.1)
Sodium: 142 mmol/L (ref 135–145)
TOTAL PROTEIN: 5.7 g/dL — AB (ref 6.5–8.1)

## 2015-11-04 LAB — APTT
APTT: 100 s — AB (ref 24–37)
APTT: 35 s (ref 24–37)
APTT: 33 s (ref 24–37)

## 2015-11-04 LAB — LACTIC ACID, PLASMA: Lactic Acid, Venous: 1.9 mmol/L (ref 0.5–2.0)

## 2015-11-04 LAB — CARBOXYHEMOGLOBIN
CARBOXYHEMOGLOBIN: 1 % (ref 0.5–1.5)
Methemoglobin: 0.9 % (ref 0.0–1.5)
O2 SAT: 59.8 %
Total hemoglobin: 13.4 g/dL — ABNORMAL LOW (ref 13.5–18.0)

## 2015-11-04 LAB — TROPONIN I: TROPONIN I: 3.13 ng/mL — AB (ref <0.031)

## 2015-11-04 MED ORDER — ASPIRIN EC 325 MG PO TBEC: 325.0000 mg | DELAYED_RELEASE_TABLET | Freq: Once | ORAL | Status: DC

## 2015-11-04 MED ORDER — POTASSIUM CHLORIDE 10 MEQ/50ML IV SOLN
10.0000 meq | INTRAVENOUS | Status: AC
  Administered 2015-11-04 (×3): 10 meq via INTRAVENOUS
  Filled 2015-11-04 (×2): qty 50

## 2015-11-04 MED ORDER — HEPARIN (PORCINE) IN NACL 100-0.45 UNIT/ML-% IJ SOLN
1250.0000 [IU]/h | INTRAMUSCULAR | Status: DC
  Administered 2015-11-04: 1300 [IU]/h via INTRAVENOUS
  Administered 2015-11-05: 1250 [IU]/h via INTRAVENOUS
  Administered 2015-11-06 – 2015-11-11 (×6): 1100 [IU]/h via INTRAVENOUS
  Administered 2015-11-12 – 2015-11-13 (×2): 1250 [IU]/h via INTRAVENOUS
  Filled 2015-11-04 (×10): qty 250

## 2015-11-04 MED ORDER — INSULIN ASPART 100 UNIT/ML ~~LOC~~ SOLN
10.0000 [IU] | Freq: Once | SUBCUTANEOUS | Status: AC
  Administered 2015-11-04: 10 [IU] via INTRAVENOUS

## 2015-11-04 MED ORDER — INSULIN REGULAR HUMAN 100 UNIT/ML IJ SOLN
INTRAMUSCULAR | Status: DC
  Administered 2015-11-04: 1.4 [IU]/h via INTRAVENOUS
  Filled 2015-11-04: qty 2.5

## 2015-11-04 MED ORDER — POTASSIUM CHLORIDE 10 MEQ/50ML IV SOLN
INTRAVENOUS | Status: AC
  Administered 2015-11-04: 10 meq via INTRAVENOUS
  Filled 2015-11-04: qty 50

## 2015-11-04 MED ORDER — POTASSIUM CHLORIDE 10 MEQ/100ML IV SOLN: 10.0000 meq | INTRAVENOUS | Status: DC

## 2015-11-04 MED ORDER — FUROSEMIDE 10 MG/ML IJ SOLN
40.0000 mg | Freq: Two times a day (BID) | INTRAMUSCULAR | Status: AC
  Administered 2015-11-04 (×2): 40 mg via INTRAVENOUS
  Filled 2015-11-04 (×2): qty 4

## 2015-11-04 MED ORDER — INSULIN ASPART 100 UNIT/ML ~~LOC~~ SOLN: 2.0000 [IU] | SUBCUTANEOUS | Status: DC

## 2015-11-04 MED ORDER — ASPIRIN 325 MG PO TABS
325.0000 mg | ORAL_TABLET | Freq: Once | ORAL | Status: AC
  Administered 2015-11-04: 325 mg via ORAL
  Filled 2015-11-04: qty 1

## 2015-11-04 MED ORDER — PRAVASTATIN SODIUM 40 MG PO TABS
40.0000 mg | ORAL_TABLET | Freq: Every day | ORAL | Status: DC
  Administered 2015-11-04 – 2015-11-12 (×9): 40 mg via ORAL
  Filled 2015-11-04 (×9): qty 1

## 2015-11-04 MED FILL — Medication: Qty: 1 | Status: AC

## 2015-11-04 NOTE — Progress Notes (Addendum)
Patient Name: Randy Nunez Date of Encounter: 11/04/2015  Active Problems:   Essential hypertension, benign   Acute on chronic combined systolic and diastolic CHF (congestive heart failure) ([place])   Atrial fibrillation ([place])   Cardiac arrest ([place])   Acute congestive heart failure ([place])   Length of Stay: 1  SUBJECTIVE  The patient is intubated and sedated. He was intubated the last night. He follows commands, denies chest pain.  CURRENT MEDS . antiseptic oral rinse  7 mL Mouth Rinse QID  . aspirin  81 mg Per Tube Daily  . aztreonam  1 g Intravenous Q8H  . chlorhexidine gluconate (SAGE KIT)  15 mL Mouth Rinse BID  . famotidine (PEPCID) IV  20 mg Intravenous Q24H  . levofloxacin (LEVAQUIN) IV  750 mg Intravenous Q24H  . metoprolol succinate  50 mg Oral Q0600  . pravastatin  40 mg Oral q1800  . vancomycin  750 mg Intravenous Q12H   . sodium chloride 999 mL/hr at 11/23/2015 1746  . amiodarone 30 mg/hr (11/04/15 1042)  . heparin 1,300 Units/hr (11/04/15 1020)  . insulin (NOVOLIN-R) infusion 5.2 mL/hr at 11/04/15 0710  . propofol (DIPRIVAN) infusion 5 mcg/kg/min (11/04/15 1021)   OBJECTIVE  Filed Vitals:   11/04/15 0751 11/04/15 0800 11/04/15 0815 11/04/15 0900  BP:  86/64 86/64 100/65  Pulse:  97 119 111  Temp: 99.8 F (37.7 C)     TempSrc: Axillary     Resp:  '18 17 15  '$ Height:      Weight:      SpO2:  98% 99% 99%    Intake/Output Summary (Last 24 hours) at 11/04/15 1111 Last data filed at 11/04/15 0900  Gross per 24 hour  Intake 1105.07 ml  Output   2465 ml  Net -1359.93 ml   Filed Weights   11/15/2015 1430 11/04/15 0500  Weight: 209 lb (94.802 kg) 213 lb 3 oz (96.7 kg)   PHYSICAL EXAM  General: intubated sedated, follow commands, moves all extremities Psych: Normal affect. HEENT:  Normal  Neck: Supple without bruits or JVD. Lungs:  Resp regular and unlabored, some rales at bases. Heart: RRR no s3, s4, or murmurs. Abdomen: Soft, non-tender, non-distended,  BS + x 4.  Extremities: No clubbing, cyanosis or edema. DP/PT/Radials 2+ and equal bilaterally.  Accessory Clinical Findings  CBC  Recent Labs  11/07/2015 1545 11/26/2015 1550 11/04/15 0340  WBC 26.8*  --  17.0*  NEUTROABS 9.4*  --  13.6*  HGB 16.5 19.0* 13.8  HCT 49.4 56.0* 40.9  MCV 93.0  --  89.7  PLT 298  --  588   Basic Metabolic Panel  Recent Labs  11/13/2015 1545 11/30/2015 1550 11/04/15 0340  NA 133* 135 142  K 3.3* 4.2 3.1*  CL 94* 96* 104  CO2 20*  --  28  GLUCOSE 578* 571* 205*  BUN 29* 45* 24*  CREATININE 1.65* 1.40* 1.40*  CALCIUM 8.6*  --  8.1*   Liver Function Tests  Recent Labs  11/26/2015 1545 11/04/15 0340  AST 140* 94*  ALT 80* 70*  ALKPHOS 117 76  BILITOT 0.8 0.9  PROT 7.6 5.7*  ALBUMIN 4.1 3.0*    Recent Labs  11/23/2015 1545 11/05/2015 2225 11/04/15 0340  TROPONINI 0.03 1.62* 3.13*    Recent Labs  11/10/2015 2225  TRIG 357*   Dg Chest Portable 1 View  11/13/2015  CLINICAL DATA:  Acute respiratory failure.  Central line placement. EXAM: PORTABLE CHEST 1 VIEW COMPARISON:  11/13/2015 FINDINGS: The endotracheal tube and NG tubes are stable. New right IJ central venous catheter tip is in the distal SVC near the cavoatrial junction. No complicating features. Persistent cardiac enlargement and diffuse pulmonary edema. IMPRESSION: Right IJ center venous catheter tip is in the distal SVC. The endotracheal tube and NG tubes are stable. Persistent pulmonary edema. Electronically Signed   By: Marijo Sanes M.D.   On: 11/02/2015 17:39   Dg Chest Portable 1 View  11/07/2015  CLINICAL DATA:  Sudden shortness of breath, diaphoretic. EXAM: PORTABLE CHEST 1 VIEW COMPARISON:  Chest x-rays dated 09/12/2015 and 08/22/2013. FINDINGS: Cardiomegaly is stable. Median sternotomy wires are stable in alignment. Endotracheal tube appears well positioned with tip just above the level of the carina. Enteric tube passes below the diaphragm. There is central pulmonary vascular  congestion and bilateral interstitial edema suggesting volume overload/ CHF. No pleural effusion or pneumothorax seen. IMPRESSION: 1. Cardiomegaly with central pulmonary vascular congestion and bilateral interstitial edema indicating CHF/volume overload. 2. Endotracheal tube well positioned with tip approximately 2 cm above the level of the carina. Electronically Signed   By: Franki Cabot M.D.   On: 11/18/2015 15:57   TELE: a-fib with RVR   ASSESSMENT AND PLAN  # NSTEMI - Troponin elevation in the setting of respiratory failure - The patient required intubation the last night sec to respiratory failure, he is intubated and sedated, follows commends, denies chest pain.  We don't know his code status, family doesn't know, we will wait until he is more stable and extubated to make the decision. - meanwhile continue metoprolol heparin drip, pravastatin, aspirin - repeat TTE to evaluate for wall motion abnormalities, - continue cycling troponin  # PEA Arrest - May have been due to above versus primary respiratory etiology, such as pulmonary edema or pneumonia.  - We accordingly agree with diuretic & antimicrobial efforts.  - Though there is not overt evidence of VT, we are not opposed to the continuation of Amiodarone for now.  # h/o Chronic Afib - We have transitioned him from Apixaban to Heparin in the setting of ACS.  - Otherwise, we will continue his current Metoprolol. Agree with iv amiodarone as he has RVR - We will hold his Diltiazem for now as he is hypotensive.  # h/o HFrEF - Mildly volume overloaded on physical exam. - We agree with his diuresis as per above. - Given that he is adequately perfused on physical exam, we have continued his home Metoprolol but stopped his Diltiazem.   # h/o HTN - Hold home Diltiazem. Continue home Metoprolol.   The patient was seen earlier this morning at 3 am for a consultation, however required further attention and decision making regarding  critical consition, spent 35 minutes and half of the time with the patient/family.  Signed, Dorothy Spark MD, [facility] 11/04/2015

## 2015-11-04 NOTE — Progress Notes (Signed)
ANTICOAGULATION CONSULT NOTE - Initial Consult  Pharmacy Consult for Heparin (while holding apixaban) Indication: chest pain/ACS and atrial fibrillation  Patient Measurements: Height: 5\' 11"  (180.3 cm) Weight: 209 lb (94.802 kg) IBW/kg (Calculated) : 75.3  Vital Signs: Temp: 98.3 F (36.8 C) (04/03 0340) Temp Source: Axillary (04/03 0340) BP: 88/65 mmHg (04/03 0415) Pulse Rate: 57 (04/03 0415)  Labs:  Recent Labs  11/27/2015 1545 11/22/2015 1550 11/02/2015 2225 11/24/2015 2330 11/04/15 0340  HGB 16.5 19.0*  --   --  13.8  HCT 49.4 56.0*  --   --  40.9  PLT 298  --   --   --  212  APTT  --   --   --  33  --   LABPROT  --   --   --  15.5*  --   INR  --   --   --  1.22  --   CREATININE 1.65* 1.40*  --   --  1.40*  TROPONINI 0.03  --  1.62*  --  3.13*    Estimated Creatinine Clearance: 52.8 mL/min (by C-G formula based on Cr of 1.4).   Medical History: Past Medical History  Diagnosis Date  . Diabetes mellitus without complication (HCC)     x  8 yrs.  . Hypertension   . CAD (coronary artery disease)     a. s/p CABG 2001. b. NSTEMI 08/2013 - cath done, med rx recommended.  . Atrial fibrillation (HCC)     a. Dx 08/2013. Placed on eliquis. Plan DCCV as outpt. Also has had WCT c/w aberrancy.  . Chronic combined systolic and diastolic CHF (congestive heart failure) (HCC)   . Acute respiratory failure (HCC)     a. 08/2013 - VDRF due to pulm edema.   Assessment: 75 y/o M [facility] after brief cardiac arrest, apixaban PTA for afib, pt received a dose of apixaban on 4/2 ~2200 so will start heparin at 1000 today. CBC good this AM. Troponin elevated. Mild bump in Scr. Checking baseline heparin level and aPTT this AM. Will likely be using aPTT to dose for the next 24-48 hours given apixaban influence on anti-Xa level.   Goal of Therapy:  Heparin level 0.3-0.7 units/ml aPTT 66-102 seconds Monitor platelets by anticoagulation protocol: Yes   Plan:  -Checking baseline anti-Xa  level and aPTT -Start heparin drip at 1300 units/hr at 1000 -1800 aPTT -Daily CBC/HL/aPTT -Dose using aPTT until correlation between aPTT and HL occurs   Abran Duke 11/04/2015,4:47 AM

## 2015-11-04 NOTE — Progress Notes (Signed)
VASCULAR LAB PRELIMINARY  PRELIMINARY  PRELIMINARY  PRELIMINARY  Bilateral lower extremity venous duplex completed.     Bilateral:  No evidence of DVT, superficial thrombosis, or Baker's Cyst.   Jenetta Loges, RVT, RDMS 11/04/2015, 11:28 AM

## 2015-11-04 NOTE — Progress Notes (Signed)
eLink Physician-Brief Progress Note Patient Name: TEJA ARREOLA DOB: 1940/10/30 MRN: [id number]   Date of Service  11/04/2015  HPI/Events of Note  Glu eleavted  eICU Interventions  IV reg insulin x 1 Repeat in 1 hr Current is about 300     Intervention Category Major Interventions: Hyperglycemia - active titration of insulin therapy  Nelda Bucks. 11/04/2015, 12:28 AM

## 2015-11-04 NOTE — Consult Note (Signed)
Unable to complete HCPOA nd Advanced Directive.  This is patient driven and patient is sedated and intubated.

## 2015-11-04 NOTE — Progress Notes (Signed)
Call placed to Dr. Tyson Alias in E-ICU at appropximately 0015 to verify if patient should be on general medical hyperglycemia orders or on ICU hyperglycemia protocol and subsequent transition to glucostabilizer.  Reason for request is two CBG results > 300 on 11/27/2015 at 2000 and 2345.  Instructions from Dr. Tyson Alias via telephone were to recheck another CBG now (0030).  After that result of 287, his instructions are reflected in his orders to give IV insulin dose x 1 and recheck in one hour.  After recheck at 0130, Dr. Tyson Alias stated to follow ICU Hyperglycemia phase 1 orders for "moderate scale" at that time, which require moving to phase 2.  Phase 2 initiated by RN per phase 1 guidelines.

## 2015-11-04 NOTE — Progress Notes (Signed)
PULMONARY / CRITICAL CARE MEDICINE   Name: Randy Nunez MRN: [id number] DOB: 1940-09-27    ADMISSION DATE:  2015/11/27  CHIEF COMPLAINT:  Respiratory failure and cardiac arrest  HISTORY OF PRESENT ILLNESS:   Randy Nunez is a 75 y.o. male with diabetes mellitus, hypertenstion, CAD s/p CABG, chronic atrial fibrillation and HFrEF who presented to the emergency department with shortness of breath earlier today. History is obtained by reports and chart review as his family who is present in our ICU was not with him during these events.  Randy Nunez reported severe shortness of breath after eating this afternoon with the feeling of being "full" after being normal this morning. He eventually started sweating and asked his neighbor in his assisted care living center to take him to the ED for breathing difficulties. He was conversant when he arrived in the ED, but after transfer to the ED room, he was noted to be in extremis, ashen colored and lost his pulse. CPR was initiated for PEA cardiac arrest and the patient had ROSC after intubation without the need for ACLS medications or shocks. Immediately after intubation he was noted to have an irregular wide complex tachycardia and was given a bolus of amiodarone followed by an infusion. It was noted he had pink frothy sputum in his ETT. He was then transferred to [facility] ICU.   PAST MEDICAL HISTORY :  He  has a past medical history of Diabetes mellitus without complication (HCC); Hypertension; CAD (coronary artery disease); Atrial fibrillation (HCC); Chronic combined systolic and diastolic CHF (congestive heart failure) (HCC); and Acute respiratory failure (HCC).  PAST SURGICAL HISTORY: He  has past surgical history that includes Left heart cath (2015); Coronary artery bypass graft (2001); and left heart catheterization with coronary angiogram (N/A, 08/21/2013).  Allergies  Allergen Reactions  . Penicillins     Has patient had a PCN reaction causing  immediate rash, facial/tongue/throat swelling, SOB or lightheadedness with hypotension: unknown Has patient had a PCN reaction causing severe rash involving mucus membranes or skin necrosis: Nounknown Has patient had a PCN reaction that required hospitalization Nono Has patient had a PCN reaction occurring within the last 10 years: Nono If all of the above answers are "NO", then may proceed with Ceph    No current facility-administered medications on file prior to encounter.   Current Outpatient Prescriptions on File Prior to Encounter  Medication Sig  . ALPRAZolam (XANAX) 0.5 MG tablet Take 0.5 mg by mouth daily as needed for anxiety.  Marland Kitchen apixaban (ELIQUIS) 5 MG TABS tablet TAKE (1) TABLET TWICE DAILY.  Marland Kitchen aspirin 81 MG chewable tablet Chew 1 tablet (81 mg total) by mouth daily.  . calcium-vitamin D (OSCAL WITH D) 500-200 MG-UNIT per tablet Take 1 tablet by mouth daily.  Marland Kitchen diltiazem (CARDIZEM LA) 120 MG 24 hr tablet Take 3 tablets (360 mg total) by mouth daily.  . insulin aspart (NOVOLOG) 100 UNIT/ML FlexPen Inject 10 Units into the skin 3 (three) times daily with meals.  . Insulin Detemir (LEVEMIR) 100 UNIT/ML Pen Inject 20 Units into the skin daily at 10 pm.  . Insulin Pen Needle 31G X 6 MM MISC Use as directed (Patient not taking: Reported on 09/27/2015)  . metoprolol succinate (TOPROL-XL) 50 MG 24 hr tablet Take 1 tablet (50 mg total) by mouth 2 (two) times daily. Take with or immediately following a meal.  . Multiple Vitamin (MULTIVITAMIN WITH MINERALS) TABS tablet Take 1 tablet by mouth daily.  . potassium  chloride SA (K-DUR,KLOR-CON) 20 MEQ tablet Take 2 tablets (40 mEq total) by mouth daily.  . pravastatin (PRAVACHOL) 40 MG tablet Take 1 tablet (40 mg total) by mouth at bedtime.  . torsemide (DEMADEX) 20 MG tablet Take 2 tablets (40 mg total) by mouth 2 (two) times daily.    FAMILY HISTORY:  His indicated that his mother is deceased. He indicated that his father is deceased. He  indicated that his sister is alive. He indicated that his brother is alive. He indicated that his daughter is alive. He indicated that his other is alive.   SOCIAL HISTORY: He  reports that he has never smoked. He does not have any smokeless tobacco history on file. He reports that he does not drink alcohol or use illicit drugs.  REVIEW OF SYSTEMS:   Unable to obtain  SUBJECTIVE:   VITAL SIGNS: BP 100/65 mmHg  Pulse 111  Temp(Src) 99.8 F (37.7 C) (Axillary)  Resp 15  Ht 5\' 11"  (1.803 m)  Wt 96.7 kg (213 lb 3 oz)  BMI 29.75 kg/m2  SpO2 99%  HEMODYNAMICS:    VENTILATOR SETTINGS: Vent Mode:  [-] PRVC FiO2 (%):  [40 %-100 %] 40 % Set Rate:  [14 bmp-16 bmp] 16 bmp Vt Set:  [550 mL] 550 mL PEEP:  [5 cmH20-8 cmH20] 8 cmH20 Plateau Pressure:  [19 cmH20-22 cmH20] 20 cmH20  INTAKE / OUTPUT: I/O last 3 completed shifts: In: 1011 [I.V.:521; NG/GT:90; IV Piggyback:400] Out: 2350 [Urine:1550; Emesis/NG output:800]  PHYSICAL EXAMINATION: General: Sedated, moving extremities spontaneously, not interactive, intubated Neuro: Low tone in all extremities, no clonus or hyperreflexia, pupils ~44mm bilaterally and reactive, no facial droop noted HEENT: Moist mucous membranes, clear tongue, ETT in place through mouth Cardiovascular: Irregularly irregular, no murmurs, previous sternotomy scar noted Lungs: Crackles present bilaterally, particularly in the bases Abdomen: Obese, soft, nontender, no appreciable hepatosplenomegaly Musculoskeletal: Warm extremities, 2+ pitting edema in legs Skin: Hyperpigmented skin in feet  LABS:  BMET  Recent Labs Lab 11/25/2015 1545 11/08/2015 1550 11/04/15 0340  NA 133* 135 142  K 3.3* 4.2 3.1*  CL 94* 96* 104  CO2 20*  --  28  BUN 29* 45* 24*  CREATININE 1.65* 1.40* 1.40*  GLUCOSE 578* 571* 205*    Electrolytes  Recent Labs Lab 11/12/2015 1545 11/04/15 0340  CALCIUM 8.6* 8.1*    CBC  Recent Labs Lab 11/13/2015 1545 11/08/2015 1550  11/04/15 0340  WBC 26.8*  --  17.0*  HGB 16.5 19.0* 13.8  HCT 49.4 56.0* 40.9  PLT 298  --  212    Coag's  Recent Labs Lab 11/17/2015 2330 11/04/15 0515  APTT 33 35  INR 1.22  --     Sepsis Markers  Recent Labs Lab 11/22/2015 1630 11/24/2015 2030 11/04/15 0340  LATICACIDVEN 8.40* 2.6* 1.9    ABG  Recent Labs Lab 11/29/2015 1620 11/23/2015 2038  PHART 7.221* 7.360  PCO2ART 50.6* 46.1*  PO2ART 116* 77.0*    Liver Enzymes  Recent Labs Lab 11/15/2015 1545 11/04/15 0340  AST 140* 94*  ALT 80* 70*  ALKPHOS 117 76  BILITOT 0.8 0.9  ALBUMIN 4.1 3.0*    Cardiac Enzymes  Recent Labs Lab 11/02/2015 1545 11/10/2015 2225 11/04/15 0340  TROPONINI 0.03 1.62* 3.13*    Glucose  Recent Labs Lab 11/04/15 0025 11/04/15 0125 11/04/15 0252 11/04/15 0340 11/04/15 0516 11/04/15 0606  GLUCAP 287* 262* 204* 191* 173* 181*    Imaging Dg Chest Portable 1 View  11/30/2015  CLINICAL  DATA:  Acute respiratory failure.  Central line placement. EXAM: PORTABLE CHEST 1 VIEW COMPARISON:  11/09/2015 FINDINGS: The endotracheal tube and NG tubes are stable. New right IJ central venous catheter tip is in the distal SVC near the cavoatrial junction. No complicating features. Persistent cardiac enlargement and diffuse pulmonary edema. IMPRESSION: Right IJ center venous catheter tip is in the distal SVC. The endotracheal tube and NG tubes are stable. Persistent pulmonary edema. Electronically Signed   By: Rudie Meyer M.D.   On: 11/13/2015 17:39   Dg Chest Portable 1 View  11/26/2015  CLINICAL DATA:  Sudden shortness of breath, diaphoretic. EXAM: PORTABLE CHEST 1 VIEW COMPARISON:  Chest x-rays dated 09/12/2015 and 08/22/2013. FINDINGS: Cardiomegaly is stable. Median sternotomy wires are stable in alignment. Endotracheal tube appears well positioned with tip just above the level of the carina. Enteric tube passes below the diaphragm. There is central pulmonary vascular congestion and bilateral  interstitial edema suggesting volume overload/ CHF. No pleural effusion or pneumothorax seen. IMPRESSION: 1. Cardiomegaly with central pulmonary vascular congestion and bilateral interstitial edema indicating CHF/volume overload. 2. Endotracheal tube well positioned with tip approximately 2 cm above the level of the carina. Electronically Signed   By: Bary Richard M.D.   On: 11/17/2015 15:57     STUDIES:   CULTURES: Resp 4/2 >>  Blood 4/2 >>  Urine 4/2 >>   ANTIBIOTICS: vanco 4/2 >> 4/3 Aztreonam 4/2 >> 4/3 levaquin 4/2 >>   SIGNIFICANT EVENTS:  LINES/TUBES: Right Internal jugular CVC 4/2 >>  Foley catheter 4/2 >>  OG tube 4/2 >>   DISCUSSION: ROBB SIBAL is a 75 y.o. male with diabetes mellitus, hypertenstion, CAD s/p CABG, chronic atrial fibrillation and HFrEF who presented to the emergency department with shortness of breath earlier today with subsequent PEA cardiac arrest and intubation.   His clinical picture is very similar to his presentation in Feb 2017 of acute on chronic congestive heart failure exacerbation with evidence of hypervolemia on examination, elevated BNP (408 2/9 --> 619 today), and pulmonary edema on CXR. Due to lack of history, we have to consider alternatives as well. Healthcare associated pneumonia is a possibility with his elevated white count and pulmonary findings and will be covered for while we get culture data; Acute coronary event is possible in light of his severe CAD and we will trend troponins  ASSESSMENT / PLAN:  PULMONARY A: Acute hypoxemic respiratory failure Acute cardiogenic pulmonary edema Possible HCAP, doubt P:   PRVC 8cc/kg Wean PEEP and Fio2 as able Empiric abx as below Diuresis if possible  CARDIOVASCULAR A:  Acute on chronic systolic congestive heart failure exacerbation PEA arrest (likely respiratory etiology) Chronic atrial fibrillation (narrow complex) , normal rate NSTEMI, ? Stress-related Intermittent LBBB P:   Amiodarone  ASA, heparin, statin,  Initiate diuresis if able to tolerate Follow troponin  cardiology consultation, ? Any role for L heart cath. TTE deferred at this time, ? Any role to repeat   RENAL A:   Acute on chronic kidney failure P:   Follow BMP Start lasix 40 bid on 4/3   GASTROINTESTINAL A:   SUP P:   PPI Start feeds 4/4 if to remain ventilated  HEMATOLOGIC A:   anticoag for A fib and ACS P:  Heparin gtt   INFECTIOUS A:   Possible HCAP P:   Empiric abx started 4/2 Continue levaquin, stop aztreonam and vanco given lower suspicion that this is PNA  ENDOCRINE A:   DM P:  SSI per protocol  NEUROLOGIC A:   S/p Cardiac arrest Sedation for MV P:   RASS goal: -1 Given short duration, fact that he did not require ACLS will defer hypothermia Propofol + fentanyl prn.    FAMILY  - Updates: Updated his two sisters, two daughters and their spouses at bedside on 4/2. Will continue FULL CODE for now after discussion with family; but it is noted that he stated on 09/12/15 that he did not want to be placed on the mechanical ventilator (DNR/DNI) to Dr Ross Marcus. Will readdress with patient once he has capacity  - Inter-disciplinary family meet or Palliative Care meeting due by:  11/10/15   Independent CC time 40 minutes   Levy Pupa, MD, PhD 11/04/2015, 11:28 AM [facility] [phone] or if no answer [phone]

## 2015-11-04 NOTE — Progress Notes (Signed)
ANTICOAGULATION CONSULT NOTE - Initial Consult  Pharmacy Consult for Heparin (while holding apixaban) Indication: chest pain/ACS and atrial fibrillation  Patient Measurements: Height: 5\' 11"  (180.3 cm) Weight: 213 lb 3 oz (96.7 kg) IBW/kg (Calculated) : 75.3  Vital Signs: Temp: 98.3 F (36.8 C) (04/03 1652) Temp Source: Axillary (04/03 1652) BP: 142/61 mmHg (04/03 1800) Pulse Rate: 92 (04/03 1800)  Labs:  Recent Labs  2015/11/18 1545 Nov 18, 2015 1550 11-18-2015 2225 November 18, 2015 2330 11/04/15 0340 11/04/15 0515 11/04/15 1752  HGB 16.5 19.0*  --   --  13.8  --   --   HCT 49.4 56.0*  --   --  40.9  --   --   PLT 298  --   --   --  212  --   --   APTT  --   --   --  33  --  35 100*  LABPROT  --   --   --  15.5*  --   --   --   INR  --   --   --  1.22  --   --   --   HEPARINUNFRC  --   --   --   --   --  1.74*  --   CREATININE 1.65* 1.40*  --   --  1.40*  --   --   TROPONINI 0.03  --  1.62*  --  3.13*  --   --     Estimated Creatinine Clearance: 53.3 mL/min (by C-G formula based on Cr of 1.4).   Medical History: Past Medical History  Diagnosis Date  . Diabetes mellitus without complication (HCC)     x  8 yrs.  . Hypertension   . CAD (coronary artery disease)     a. s/p CABG 2001. b. NSTEMI 08/2013 - cath done, med rx recommended.  . Atrial fibrillation (HCC)     a. Dx 08/2013. Placed on eliquis. Plan DCCV as outpt. Also has had WCT c/w aberrancy.  . Chronic combined systolic and diastolic CHF (congestive heart failure) (HCC)   . Acute respiratory failure (HCC)     a. 08/2013 - VDRF due to pulm edema.   Assessment: 75 y/o M on apixaban PTA for afib, transfer from [facility] after brief cardiac arrest, He is on IV heparin while apixaban is on hold. aPTT level = 100, high-end therapeutic.    Goal of Therapy:  Heparin level 0.3-0.7 units/ml aPTT 66-102 seconds Monitor platelets by anticoagulation protocol: Yes   Plan:  -Decrease heparin rate slightly to 1250 units/hr -f/u AM  labs -Daily CBC/HL/aPTT -Dose using aPTT until correlation between aPTT and HL occurs   Bayard Hugger, PharmD, BCPS  Clinical Pharmacist  Pager: [phone]   11/04/2015,7:17 PM

## 2015-11-04 NOTE — Care Management Note (Signed)
Case Management Note  Patient Details  Name: Randy Nunez MRN: [id number] Date of Birth: 06-19-1941  Subjective/Objective:                    Action/Plan:  Per bedside nurse; pt is from an assisted living facility.  Pt is intubated.  CM contacted both sister Randy Nunez and daughter listed in shadow chart; both said they did not know what facility.  Sister instructed CM to contact pts other sister Randy Nunez at [phone] -  CM left voice mail requesting call back.   Expected Discharge Date:                  Expected Discharge Plan:   (From asssited living facility )  In-House Referral:  Clinical Social Work  Discharge planning Services  CM Consult  Post Acute Care Choice:    Choice offered to:     DME Arranged:    DME Agency:     HH Arranged:    HH Agency:     Status of Service:  In process, will continue to follow  Medicare Important Message Given:    Date Medicare IM Given:    Medicare IM give by:    Date Additional Medicare IM Given:    Additional Medicare Important Message give by:     If discussed at Long Length of Stay Meetings, dates discussed:    Additional Comments:  Cherylann Parr, RN 11/04/2015, 4:10 PM

## 2015-11-04 NOTE — Consult Note (Addendum)
History & Physical    Patient ID: Randy Nunez MRN: [id number], DOB/AGE: 1941/05/31  Admit date: 11/02/2015 Primary Physician: [name], MD Primary Cardiologist: Tobias Alexander, MD  History of Present Illness    75 yo M w a h/o CAD s/p CABG (2001), HFrEF, chronic Afib, DM, & HTN was transferred from [facility] where he presented today with dyspnea & suffered a PEA arrest while in the ED.  Prior to his report of dyspnea this morning, he had reportedly been at his baseline.  He was urgently intubated & subsequently had ROSC without ACLS.  Due to a wide complex tachyarrrhythmia, he was given an Amiodarone bolus & started on a gtt.  His EKG there initially demonstrated Afib with a rate-related RBBB, but he has now developed a LBBB throughout the course of the evening.  Otherwise, his EKG demonstrates nonspecific ST-T abnormalities.  His initial troponin was 0.03 but has risen to 1.62 & then 3.13.  Also, his initial Lactate was initially 12 but is now 2.  Notably he had two recent admissions:  09/12/15 to 09/14/15 for  HCAP/Sepsis/CHF exacerbation & 08/29/15 to 08/31/15 for CHF exacerbation.  Past Medical History    Past Medical History  Diagnosis Date  . Diabetes mellitus without complication (HCC)     x  8 yrs.  . Hypertension   . CAD (coronary artery disease)     a. s/p CABG 2001. b. NSTEMI 08/2013 - cath done, med rx recommended.  . Atrial fibrillation (HCC)     a. Dx 08/2013. Placed on eliquis. Plan DCCV as outpt. Also has had WCT c/w aberrancy.  . Chronic combined systolic and diastolic CHF (congestive heart failure) (HCC)   . Acute respiratory failure (HCC)     a. 08/2013 - VDRF due to pulm edema.    Past Surgical History  Procedure Laterality Date  . Left heart cath  2015    Dr Allyson Sabal, with stenting  . Coronary artery bypass graft  2001  . Left heart catheterization with coronary angiogram N/A 08/21/2013    Procedure: LEFT HEART CATHETERIZATION WITH CORONARY ANGIOGRAM;  Surgeon:  Runell Gess, MD;  Location: [facility] CATH LAB;  Service: Cardiovascular;  Laterality: N/A;    Allergies:  PCN  Home Medications    Prior to Admission medications   Medication Sig Start Date End Date Taking? Authorizing Provider  ALPRAZolam Prudy Feeler) 0.5 MG tablet Take 0.5 mg by mouth daily as needed for anxiety.    Historical Provider, MD  apixaban (ELIQUIS) 5 MG TABS tablet TAKE (1) TABLET TWICE DAILY. 10/01/14   Lars Masson, MD  aspirin 81 MG chewable tablet Chew 1 tablet (81 mg total) by mouth daily. 08/23/13   Lonia Blood, MD  calcium-vitamin D (OSCAL WITH D) 500-200 MG-UNIT per tablet Take 1 tablet by mouth daily.    Historical Provider, MD  diltiazem (CARDIZEM LA) 120 MG 24 hr tablet Take 3 tablets (360 mg total) by mouth daily. 10/01/14   Lars Masson, MD  insulin aspart (NOVOLOG) 100 UNIT/ML FlexPen Inject 10 Units into the skin 3 (three) times daily with meals. 09/14/15   Erick Blinks, MD  Insulin Detemir (LEVEMIR) 100 UNIT/ML Pen Inject 20 Units into the skin daily at 10 pm. 09/14/15   Erick Blinks, MD  Insulin Pen Needle 31G X 6 MM MISC Use as directed Patient not taking: Reported on 09/27/2015 09/14/15   Erick Blinks, MD  metoprolol succinate (TOPROL-XL) 50 MG 24 hr tablet Take 1  tablet (50 mg total) by mouth 2 (two) times daily. Take with or immediately following a meal. 10/01/14   Lars Masson, MD  Multiple Vitamin (MULTIVITAMIN WITH MINERALS) TABS tablet Take 1 tablet by mouth daily.    Historical Provider, MD  potassium chloride SA (K-DUR,KLOR-CON) 20 MEQ tablet Take 2 tablets (40 mEq total) by mouth daily. 10/01/14   Lars Masson, MD  pravastatin (PRAVACHOL) 40 MG tablet Take 1 tablet (40 mg total) by mouth at bedtime. 10/01/14   Lars Masson, MD  torsemide (DEMADEX) 20 MG tablet Take 2 tablets (40 mg total) by mouth 2 (two) times daily. 09/14/15   Erick Blinks, MD   Family History    Family History  Problem Relation Age of Onset  . Hypertension     . Hyperlipidemia    . Alzheimer's disease Mother   . Heart attack Father    Social History    Social History   Social History  . Marital Status: Single    Spouse Name: N/A  . Number of Children: N/A  . Years of Education: N/A   Occupational History  . Not on file.   Social History Main Topics  . Smoking status: Never Smoker   . Smokeless tobacco: Not on file  . Alcohol Use: No  . Drug Use: No  . Sexual Activity: Not Currently   Other Topics Concern  . Not on file   Social History Narrative    Review of Systems    General:  No chills, fever, night sweats or weight changes.  Cardiovascular:  No chest pain, dyspnea on exertion, edema, orthopnea, palpitations, paroxysmal nocturnal dyspnea. Dermatological: No rash, lesions/masses Respiratory: No cough, dyspnea Urologic: No hematuria, dysuria Abdominal:   No nausea, vomiting, diarrhea, bright red blood per rectum, melena, or hematemesis Neurologic:  No visual changes, wkns, changes in mental status. All other systems reviewed and are otherwise negative except as noted above.  Physical Exam    Blood pressure 148/135, pulse 129, temperature 98.3 F (36.8 C), temperature source Axillary, resp. rate 12, height 5\' 11"  (1.803 m), weight 94.802 kg (209 lb), SpO2 99 %.  General: Pleasant, NAD Psych: Normal affect. Neuro: Alert and oriented X 3. Moves all extremities spontaneously. HEENT: Normal  Neck: Supple without bruits or JVD. Lungs:  Resp regular and unlabored, CTA. Heart: RRR no s3, s4, or murmurs. Abdomen: Soft, non-tender, non-distended, BS + x 4.  Extremities: No clubbing, cyanosis or edema. DP/PT/Radials 2+ and equal bilaterally.  Labs    Troponin [facility] Test)  Recent Labs  11/13/2015 1546  TROPIPOC 0.00    Recent Labs  11/09/2015 1545 11/16/2015 2225 11/04/15 0340  TROPONINI 0.03 1.62* 3.13*   Lab Results  Component Value Date   WBC 17.0* 11/04/2015   HGB 13.8 11/04/2015   HCT 40.9  11/04/2015   MCV 89.7 11/04/2015   PLT 212 11/04/2015    Recent Labs Lab 11/04/15 0340  NA 142  K 3.1*  CL 104  CO2 28  BUN 24*  CREATININE 1.40*  CALCIUM 8.1*  PROT 5.7*  BILITOT 0.9  ALKPHOS 76  ALT 70*  AST 94*  GLUCOSE 205*   Lab Results  Component Value Date   CHOL 245* 08/30/2015   HDL 38* 08/30/2015   LDLCALC 152* 08/30/2015   TRIG 357* 11/06/2015   No results found for: [facility]   Radiology Studies    Dg Chest Portable 1 View  11/09/2015  CLINICAL DATA:  Acute respiratory failure.  Central line placement. EXAM: PORTABLE CHEST 1 VIEW COMPARISON:  11/06/2015 FINDINGS: The endotracheal tube and NG tubes are stable. New right IJ central venous catheter tip is in the distal SVC near the cavoatrial junction. No complicating features. Persistent cardiac enlargement and diffuse pulmonary edema. IMPRESSION: Right IJ center venous catheter tip is in the distal SVC. The endotracheal tube and NG tubes are stable. Persistent pulmonary edema. Electronically Signed   By: Rudie Meyer M.D.   On: November 06, 2015 17:39   Dg Chest Portable 1 View  2015/11/06  CLINICAL DATA:  Sudden shortness of breath, diaphoretic. EXAM: PORTABLE CHEST 1 VIEW COMPARISON:  Chest x-rays dated 09/12/2015 and 08/22/2013. FINDINGS: Cardiomegaly is stable. Median sternotomy wires are stable in alignment. Endotracheal tube appears well positioned with tip just above the level of the carina. Enteric tube passes below the diaphragm. There is central pulmonary vascular congestion and bilateral interstitial edema suggesting volume overload/ CHF. No pleural effusion or pneumothorax seen. IMPRESSION: 1. Cardiomegaly with central pulmonary vascular congestion and bilateral interstitial edema indicating CHF/volume overload. 2. Endotracheal tube well positioned with tip approximately 2 cm above the level of the carina. Electronically Signed   By: Bary Richard M.D.   On: 11-06-15 15:57   Assessment & Plan    A/P  75 yo M w a  h/o CAD s/p CABG (2001), HFrEF, chronic Afib, DM, & HTN was transferred from [facility] where he presented today with dyspnea & suffered a PEA arrest while in the ED, found to have troponin elevation, currently intubated treated for respiratory failure, which may be multifactorial with CHF exacerbation +/- PNA.  # Troponin elevation in the setting of respiratory failure - While his intermittent LBBB is concerning, it can be confirmed from his 11/04/15 that when he is not in LBBB, there is no underlying STEMI.  It can be difficult to distinguish a primary cardiac event from demand secondary to his respiratory failure, though the trajectory of his troponin elevation raises concern.  - Will plan to treat for ACS with ASA, Heparin, Metoprolol, & Pravastatin.   - Given the difficulty with his prior TTE from 08/30/15, it is unlikely that a repeat TTE will provide diagnostic utility for discriminating the presence of ischemia. - If the family is agreeable, we should likely plan for a cardiac catheterization.  This will need to be clarified ASAP as there is notation in the record that the pt may not have desired such aggressive efforts.  # PEA Arrest - May have been due to above versus primary respiratory etiology, such as pulmonary edema or pneumonia.   - We accordingly agree with diuretic & antimicrobial efforts.   - Though there is not overt evidence of VT, we are not opposed to the continuation of Amiodarone for now.  # h/o Chronic Afib - We have transitioned him from Apixaban to Heparin in the setting of ACS.   - Otherwise, we will continue his current Metoprolol. - We will hold his Diltiazem for now.  # h/o HFrEF - Mildly volume overloaded on physical exam. - We agree with his diuresis as per above. - Given that he is adequately perfused on physical exam, we have continued his home Metoprolol but stopped his Diltiazem.    # h/o HTN - Hold home Diltiazem.  Continue home Metoprolol.     Signed, Julaine Hua, MD 11/04/2015, 5:04 AM

## 2015-11-05 ENCOUNTER — Other Ambulatory Visit: Payer: Self-pay | Admitting: Licensed Clinical Social Worker

## 2015-11-05 ENCOUNTER — Inpatient Hospital Stay (HOSPITAL_COMMUNITY): Payer: PPO

## 2015-11-05 DIAGNOSIS — I5043 Acute on chronic combined systolic (congestive) and diastolic (congestive) heart failure: Secondary | ICD-10-CM

## 2015-11-05 DIAGNOSIS — I469 Cardiac arrest, cause unspecified: Secondary | ICD-10-CM

## 2015-11-05 LAB — GLUCOSE, CAPILLARY
GLUCOSE-CAPILLARY: 100 mg/dL — AB (ref 65–99)
GLUCOSE-CAPILLARY: 111 mg/dL — AB (ref 65–99)
GLUCOSE-CAPILLARY: 111 mg/dL — AB (ref 65–99)
GLUCOSE-CAPILLARY: 132 mg/dL — AB (ref 65–99)
GLUCOSE-CAPILLARY: 140 mg/dL — AB (ref 65–99)
GLUCOSE-CAPILLARY: 93 mg/dL (ref 65–99)
GLUCOSE-CAPILLARY: 99 mg/dL (ref 65–99)
Glucose-Capillary: 123 mg/dL — ABNORMAL HIGH (ref 65–99)
Glucose-Capillary: 134 mg/dL — ABNORMAL HIGH (ref 65–99)
Glucose-Capillary: 184 mg/dL — ABNORMAL HIGH (ref 65–99)

## 2015-11-05 LAB — BASIC METABOLIC PANEL
Anion gap: 10 (ref 5–15)
BUN: 17 mg/dL (ref 6–20)
CHLORIDE: 106 mmol/L (ref 101–111)
CO2: 27 mmol/L (ref 22–32)
CREATININE: 1.34 mg/dL — AB (ref 0.61–1.24)
Calcium: 8.3 mg/dL — ABNORMAL LOW (ref 8.9–10.3)
GFR calc Af Amer: 58 mL/min — ABNORMAL LOW (ref >60)
GFR calc non Af Amer: 50 mL/min — ABNORMAL LOW (ref >60)
GLUCOSE: 119 mg/dL — AB (ref 65–99)
Potassium: 3 mmol/L — ABNORMAL LOW (ref 3.5–5.1)
SODIUM: 143 mmol/L (ref 135–145)

## 2015-11-05 LAB — CULTURE, RESPIRATORY W GRAM STAIN: Special Requests: NORMAL

## 2015-11-05 LAB — CULTURE, RESPIRATORY: CULTURE: NORMAL

## 2015-11-05 LAB — HEPARIN LEVEL (UNFRACTIONATED): HEPARIN UNFRACTIONATED: 1.32 [IU]/mL — AB (ref 0.30–0.70)

## 2015-11-05 LAB — APTT: aPTT: 111 seconds — ABNORMAL HIGH (ref 24–37)

## 2015-11-05 LAB — TROPONIN I: Troponin I: 1.66 ng/mL (ref <0.031)

## 2015-11-05 MED ORDER — INSULIN GLARGINE 100 UNIT/ML ~~LOC~~ SOLN
5.0000 [IU] | Freq: Every day | SUBCUTANEOUS | Status: DC
  Administered 2015-11-05 – 2015-11-06 (×2): 5 [IU] via SUBCUTANEOUS
  Filled 2015-11-05 (×2): qty 0.05

## 2015-11-05 MED ORDER — PRO-STAT SUGAR FREE PO LIQD
30.0000 mL | Freq: Three times a day (TID) | ORAL | Status: DC
  Administered 2015-11-05 – 2015-11-11 (×18): 30 mL
  Filled 2015-11-05 (×18): qty 30

## 2015-11-05 MED ORDER — VITAL HIGH PROTEIN PO LIQD: 1000.0000 mL | ORAL | Status: DC

## 2015-11-05 MED ORDER — DILTIAZEM HCL 100 MG IV SOLR
5.0000 mg/h | INTRAVENOUS | Status: DC
  Filled 2015-11-05: qty 100

## 2015-11-05 MED ORDER — POTASSIUM CHLORIDE 20 MEQ/15ML (10%) PO SOLN
40.0000 meq | Freq: Four times a day (QID) | ORAL | Status: AC
  Administered 2015-11-05 (×2): 40 meq
  Filled 2015-11-05 (×2): qty 30

## 2015-11-05 MED ORDER — GLUCERNA 1.2 CAL PO LIQD
1000.0000 mL | ORAL | Status: DC
  Filled 2015-11-05: qty 1000

## 2015-11-05 MED ORDER — FUROSEMIDE 10 MG/ML IJ SOLN
40.0000 mg | Freq: Once | INTRAMUSCULAR | Status: AC
  Administered 2015-11-05: 40 mg via INTRAVENOUS
  Filled 2015-11-05: qty 4

## 2015-11-05 MED ORDER — INSULIN ASPART 100 UNIT/ML ~~LOC~~ SOLN
0.0000 [IU] | SUBCUTANEOUS | Status: DC
  Administered 2015-11-05 (×3): 2 [IU] via SUBCUTANEOUS
  Administered 2015-11-06: 5 [IU] via SUBCUTANEOUS
  Administered 2015-11-06: 8 [IU] via SUBCUTANEOUS
  Administered 2015-11-06: 3 [IU] via SUBCUTANEOUS
  Administered 2015-11-06 (×3): 8 [IU] via SUBCUTANEOUS
  Administered 2015-11-06: 5 [IU] via SUBCUTANEOUS
  Administered 2015-11-07 (×5): 8 [IU] via SUBCUTANEOUS
  Administered 2015-11-08: 11 [IU] via SUBCUTANEOUS
  Administered 2015-11-08: 8 [IU] via SUBCUTANEOUS
  Administered 2015-11-08: 5 [IU] via SUBCUTANEOUS
  Administered 2015-11-08 (×2): 8 [IU] via SUBCUTANEOUS
  Administered 2015-11-08: 11 [IU] via SUBCUTANEOUS
  Administered 2015-11-09: 8 [IU] via SUBCUTANEOUS
  Administered 2015-11-09 (×2): 5 [IU] via SUBCUTANEOUS
  Administered 2015-11-09: 11 [IU] via SUBCUTANEOUS
  Administered 2015-11-09: 5 [IU] via SUBCUTANEOUS
  Administered 2015-11-09: 11 [IU] via SUBCUTANEOUS
  Administered 2015-11-09: 8 [IU] via SUBCUTANEOUS
  Administered 2015-11-10: 11 [IU] via SUBCUTANEOUS

## 2015-11-05 MED ORDER — VITAL AF 1.2 CAL PO LIQD
1000.0000 mL | ORAL | Status: DC
  Administered 2015-11-05 – 2015-11-10 (×9): 1000 mL

## 2015-11-05 NOTE — Progress Notes (Signed)
Patient Name: Randy Nunez Date of Encounter: 11/05/2015  Active Problems:   Essential hypertension, benign   Acute on chronic combined systolic and diastolic CHF (congestive heart failure) ([place])   Atrial fibrillation ([place])   Cardiac arrest ([place])   Acute congestive heart failure ([place])   Respiratory arrest ([place])   Length of Stay: 2  SUBJECTIVE  The patient is intubated and sedated.   CURRENT MEDS . antiseptic oral rinse  7 mL Mouth Rinse QID  . aspirin  81 mg Per Tube Daily  . chlorhexidine gluconate (SAGE KIT)  15 mL Mouth Rinse BID  . famotidine (PEPCID) IV  20 mg Intravenous Q24H  . insulin aspart  0-15 Units Subcutaneous 6 times per day  . insulin glargine  5 Units Subcutaneous Daily  . levofloxacin (LEVAQUIN) IV  750 mg Intravenous Q24H  . metoprolol succinate  50 mg Oral Q0600  . pravastatin  40 mg Oral q1800   . amiodarone 30 mg/hr (11/05/15 1000)  . feeding supplement (GLUCERNA 1.2 CAL)    . heparin 1,100 Units/hr (11/05/15 1000)  . insulin (NOVOLIN-R) infusion Stopped (11/05/15 0700)  . propofol (DIPRIVAN) infusion 20 mcg/kg/min (11/05/15 1043)   OBJECTIVE  Filed Vitals:   11/05/15 0600 11/05/15 0700 11/05/15 0800 11/05/15 0900  BP: 107/60 109/55 134/68 106/57  Pulse: 43 45 92 92  Temp:   97 F (36.1 C)   TempSrc:   Axillary   Resp: '16 16 16 '$ 0  Height:      Weight:      SpO2: 100% 100% 100% 99%    Intake/Output Summary (Last 24 hours) at 11/05/15 1046 Last data filed at 11/05/15 1000  Gross per 24 hour  Intake 1517.45 ml  Output   1655 ml  Net -137.55 ml   Filed Weights   11/06/2015 1430 11/04/15 0500 11/05/15 0300  Weight: 209 lb (94.802 kg) 213 lb 3 oz (96.7 kg) 205 lb 11 oz (93.3 kg)   PHYSICAL EXAM  General: intubated sedated Psych: Normal affect. HEENT:  Normal  Neck: Supple without bruits or JVD. Lungs:  Resp regular and unlabored, some rales at bases. Heart: RRR no s3, s4, or murmurs. Abdomen: Soft, non-tender, non-distended, BS +  x 4.  Extremities: No clubbing, cyanosis or edema. DP/PT/Radials 2+ and equal bilaterally.  Accessory Clinical Findings  CBC  Recent Labs  11/05/2015 1545 11/09/2015 1550 11/04/15 0340  WBC 26.8*  --  17.0*  NEUTROABS 9.4*  --  13.6*  HGB 16.5 19.0* 13.8  HCT 49.4 56.0* 40.9  MCV 93.0  --  89.7  PLT 298  --  500   Basic Metabolic Panel  Recent Labs  11/22/2015 1545 11/13/2015 1550 11/04/15 0340  NA 133* 135 142  K 3.3* 4.2 3.1*  CL 94* 96* 104  CO2 20*  --  28  GLUCOSE 578* 571* 205*  BUN 29* 45* 24*  CREATININE 1.65* 1.40* 1.40*  CALCIUM 8.6*  --  8.1*   Liver Function Tests  Recent Labs  11/10/2015 1545 11/04/15 0340  AST 140* 94*  ALT 80* 70*  ALKPHOS 117 76  BILITOT 0.8 0.9  PROT 7.6 5.7*  ALBUMIN 4.1 3.0*    Recent Labs  11/26/2015 2225 11/04/15 0340 11/05/15 0857  TROPONINI 1.62* 3.13* 1.66*    Recent Labs  11/15/2015 2225  TRIG 357*   Dg Chest Portable 1 View  11/15/2015  CLINICAL DATA:  Acute respiratory failure.  Central line placement. EXAM: PORTABLE CHEST 1 VIEW COMPARISON:  11/10/2015 FINDINGS: The endotracheal tube and NG tubes are stable. New right IJ central venous catheter tip is in the distal SVC near the cavoatrial junction. No complicating features. Persistent cardiac enlargement and diffuse pulmonary edema. IMPRESSION: Right IJ center venous catheter tip is in the distal SVC. The endotracheal tube and NG tubes are stable. Persistent pulmonary edema. Electronically Signed   By: Marijo Sanes M.D.   On: 11/07/2015 17:39   Dg Chest Portable 1 View  11/24/2015  CLINICAL DATA:  Sudden shortness of breath, diaphoretic. EXAM: PORTABLE CHEST 1 VIEW COMPARISON:  Chest x-rays dated 09/12/2015 and 08/22/2013. FINDINGS: Cardiomegaly is stable. Median sternotomy wires are stable in alignment. Endotracheal tube appears well positioned with tip just above the level of the carina. Enteric tube passes below the diaphragm. There is central pulmonary vascular  congestion and bilateral interstitial edema suggesting volume overload/ CHF. No pleural effusion or pneumothorax seen. IMPRESSION: 1. Cardiomegaly with central pulmonary vascular congestion and bilateral interstitial edema indicating CHF/volume overload. 2. Endotracheal tube well positioned with tip approximately 2 cm above the level of the carina. Electronically Signed   By: Franki Cabot M.D.   On: 11/02/2015 15:57   TELE: a-fib with RVR   ASSESSMENT AND PLAN  # NSTEMI - Troponin elevation in the setting of respiratory failure - The patient required intubation on 4/2 sec to respiratory failure, he is intubated and sedated, follows commends, denies chest pain.  We don't know his code status, family doesn't know, we will wait until he is more stable and extubated to make the decision. - meanwhile continue metoprolol heparin drip, pravastatin, aspirin - repeat TTE to evaluate for wall motion abnormalities, - continue cycling troponin  # PEA Arrest - May have been due to above versus primary respiratory etiology, such as pulmonary edema or pneumonia.  - We accordingly agree with diuretic & antimicrobial efforts.  - Though there is not overt evidence of VT, we are not opposed to the continuation of Amiodarone for now.  # h/o Chronic Afib - We have transitioned him from Apixaban to Heparin in the setting of ACS.  - continue iv amiodarone as he has RVR - start diltiazem, drip at 5 mg /hr and uptitrate for ventricular rate < 100  # h/o HFrEF - Mildly volume overloaded on physical exam. - We agree with his diuresis as per above. - Given that he is adequately perfused on physical exam, we have continued his home Metoprolol but stopped his Diltiazem.   # h/o HTN - add diltiazem.   Signed, Dorothy Spark MD, [facility] 11/05/2015

## 2015-11-05 NOTE — Progress Notes (Signed)
eLink Physician-Brief Progress Note Patient Name: Randy Nunez DOB: 1940/08/18 MRN: [id number]   Date of Service  11/05/2015  HPI/Events of Note  1.8 insulin, controlled  eICU Interventions  lantus low dose as not on TF yet, add SSI Dc drip     Intervention Category Major Interventions: Hyperglycemia - active titration of insulin therapy  Nelda Bucks. 11/05/2015, 4:49 AM

## 2015-11-05 NOTE — Progress Notes (Signed)
PULMONARY / CRITICAL CARE MEDICINE   Name: Randy Nunez MRN: [id number] DOB: 1940-09-14    ADMISSION DATE:  11/12/2015  CHIEF COMPLAINT:  Respiratory failure and cardiac arrest  HISTORY OF PRESENT ILLNESS:   Randy Nunez is a 75 y.o. male with diabetes mellitus, hypertenstion, CAD s/p CABG, chronic atrial fibrillation and HFrEF who presented to the emergency department with shortness of breath earlier today. History is obtained by reports and chart review as his family who is present in our ICU was not with him during these events.  Randy Nunez reported severe shortness of breath after eating this afternoon with the feeling of being "full" after being normal this morning. He eventually started sweating and asked his neighbor in his assisted care living center to take him to the ED for breathing difficulties. He was conversant when he arrived in the ED, but after transfer to the ED room, he was noted to be in extremis, ashen colored and lost his pulse. CPR was initiated for PEA cardiac arrest and the patient had ROSC after intubation without the need for ACLS medications or shocks. Immediately after intubation he was noted to have an irregular wide complex tachycardia and was given a bolus of amiodarone followed by an infusion. It was noted he had pink frothy sputum in his ETT. He was then transferred to [facility] ICU.    SUBJECTIVE:   VITAL SIGNS: BP 106/57 mmHg  Pulse 92  Temp(Src) 97 F (36.1 C) (Axillary)  Resp 0  Ht 5\' 11"  (1.803 m)  Wt 205 lb 11 oz (93.3 kg)  BMI 28.70 kg/m2  SpO2 99%  HEMODYNAMICS:    VENTILATOR SETTINGS: Vent Mode:  [-] PRVC FiO2 (%):  [40 %] 40 % Set Rate:  [16 bmp] 16 bmp Vt Set:  [550 mL] 550 mL PEEP:  [8 cmH20] 8 cmH20 Plateau Pressure:  [18 cmH20-20 cmH20] 19 cmH20  INTAKE / OUTPUT: I/O last 3 completed shifts: In: 2739.7 [I.V.:1699.7; NG/GT:240; IV Piggyback:800] Out: 4025 [Urine:3225; Emesis/NG output:800]  PHYSICAL EXAMINATION: General:  Sedated, moving extremities spontaneously, not interactive, intubated Neuro: Low tone in all extremities, no clonus or hyperreflexia, pupils ~33mm bilaterally and reactive, no facial droop noted HEENT: Moist mucous membranes, clear tongue, ETT in place through mouth Cardiovascular: Irregularly irregular, no murmurs, previous sternotomy scar noted, afib rvr 124 Lungs: Crackles present bilaterally, particularly in the bases Abdomen: Obese, soft, nontender, no appreciable hepatosplenomegaly Musculoskeletal: Warm extremities, 2+ pitting edema in legs Skin: Hyperpigmented skin in feet  LABS:  BMET  Recent Labs Lab 11/12/2015 1545 11/26/2015 1550 11/04/15 0340  NA 133* 135 142  K 3.3* 4.2 3.1*  CL 94* 96* 104  CO2 20*  --  28  BUN 29* 45* 24*  CREATININE 1.65* 1.40* 1.40*  GLUCOSE 578* 571* 205*    Electrolytes  Recent Labs Lab 11/29/2015 1545 11/04/15 0340  CALCIUM 8.6* 8.1*    CBC  Recent Labs Lab 11/24/2015 1545 11/25/2015 1550 11/04/15 0340  WBC 26.8*  --  17.0*  HGB 16.5 19.0* 13.8  HCT 49.4 56.0* 40.9  PLT 298  --  212    Coag's  Recent Labs Lab 11/16/2015 2330 11/04/15 0515 11/04/15 1752 11/05/15 0514  APTT 33 35 100* 111*  INR 1.22  --   --   --     Sepsis Markers  Recent Labs Lab 11/24/2015 1630 11/07/2015 2030 11/04/15 0340  LATICACIDVEN 8.40* 2.6* 1.9    ABG  Recent Labs Lab 11/11/2015 1620 11/05/2015 2038  PHART 7.221* 7.360  PCO2ART 50.6* 46.1*  PO2ART 116* 77.0*    Liver Enzymes  Recent Labs Lab 11/09/2015 1545 11/04/15 0340  AST 140* 94*  ALT 80* 70*  ALKPHOS 117 76  BILITOT 0.8 0.9  ALBUMIN 4.1 3.0*    Cardiac Enzymes  Recent Labs Lab 11/09/2015 2225 11/04/15 0340 11/05/15 0857  TROPONINI 1.62* 3.13* 1.66*    Glucose  Recent Labs Lab 11/04/15 1959 11/04/15 2130 11/04/15 2238 11/04/15 2352 11/05/15 0050 11/05/15 0759  GLUCAP 116* 100* 101* 123* 99 111*    Imaging No results found.   STUDIES:    CULTURES: Resp 4/2 >> nl flora Blood 4/2 >>  Urine 4/2 >> neg  ANTIBIOTICS: vanco 4/2 >> 4/3 Aztreonam 4/2 >> 4/3 levaquin 4/2 >>   SIGNIFICANT EVENTS:  LINES/TUBES: Right Internal jugular CVC 4/2 >>  Foley catheter 4/2 >>  OG tube 4/2 >>   DISCUSSION: Randy Nunez is a 75 y.o. male with diabetes mellitus, hypertenstion, CAD s/p CABG, chronic atrial fibrillation and HFrEF who presented to the emergency department with shortness of breath earlier today with subsequent PEA cardiac arrest and intubation.   His clinical picture is very similar to his presentation in Feb 2017 of acute on chronic congestive heart failure exacerbation with evidence of hypervolemia on examination, elevated BNP (408 2/9 --> 619 today), and pulmonary edema on CXR. Due to lack of history, we have to consider alternatives as well. Healthcare associated pneumonia is a possibility with his elevated white count and pulmonary findings and will be covered for while we get culture data; Acute coronary event is possible in light of his severe CAD and we will trend troponins   Intake/Output Summary (Last 24 hours) at 11/05/15 1035 Last data filed at 11/05/15 0900  Gross per 24 hour  Intake 1482.52 ml  Output   1655 ml  Net -172.48 ml    PULMONARY A: Acute hypoxemic respiratory failure Acute cardiogenic pulmonary edema Possible HCAP, doubt P:   PRVC 8cc/kg Wean PEEP and Fio2 as able Empiric abx as below Diuresis as he can tolerate  CARDIOVASCULAR A:  Acute on chronic systolic congestive heart failure exacerbation PEA arrest (likely respiratory etiology) Chronic atrial fibrillation (narrow complex) , normal rate NSTEMI, ? Stress-related Intermittent LBBB P:  Amiodarone  ASA, heparin, statin,  Initiate diuresis , bp ok but afib with rvr continues. On amio and cards following Follow troponin  cardiology consultation, ? Any role for L heart cath. TTE deferred at this time, ? Any role to repeat    RENAL Lab Results  Component Value Date   CREATININE 1.40* 11/04/2015   CREATININE 1.40* 11/13/2015   CREATININE 1.65* 11/10/2015    A:   Acute on chronic kidney failure P:   Follow BMP Start lasix 40 bid on 4/3   GASTROINTESTINAL A:   SUP P:   PPI Start feeds 4/4 if to remain ventilated  HEMATOLOGIC A:   anticoag for A fib and ACS P:  Heparin gtt   INFECTIOUS A:   Possible HCAP P:   Empiric abx started 4/2 Continue levaquin, stop aztreonam and vanco given lower suspicion that this is PNA  ENDOCRINE CBG (last 3)   Recent Labs  11/04/15 2352 11/05/15 0050 11/05/15 0759  GLUCAP 123* 99 111*     A:   DM P:   SSI per protocol  NEUROLOGIC A:   S/p Cardiac arrest Sedation for MV P:   RASS goal: -1 Given short duration, fact that he  did not require ACLS will defer hypothermia Propofol + fentanyl prn.    FAMILY  - Updates: Updated his two sisters, two daughters and their spouses at bedside on 4/2. Will continue FULL CODE for now after discussion with family; but it is noted that he stated on 09/12/15 that he did not want to be placed on the mechanical ventilator (DNR/DNI) to Dr Ross Marcus. Will readdress with patient once he has capacity. 4/4 no family at bedside.  - Inter-disciplinary family meet or Palliative Care meeting due by:  11/10/15   Brett Canales Minor ACNP Randy Nunez PCCM Pager [phone] till 3 pm If no answer page [phone] 11/05/2015, 10:31 AM   Attending Note:  I have examined patient, reviewed labs, studies and notes. I have discussed the case with S Minor, and I agree with the data and plans as amended above. Cardiogenic pulmonary edema with Acute respiratory failure. Successful diuresis. On eval remains on PEEP 8, weaning fiO2. Check CXR and BMP now, in am. Independent critical care time is 35 minutes.   Levy Pupa, MD, PhD 11/05/2015, 10:53 AM [facility] [phone] or if no answer [phone]

## 2015-11-05 NOTE — Progress Notes (Signed)
eLink Physician-Brief Progress Note Patient Name: Randy Nunez DOB: [birth year] MRN: [id number]   Date of Service  11/05/2015  HPI/Events of Note    eICU Interventions  Hypokalemia -repleted Lasix x1     Intervention Category Intermediate Interventions: Electrolyte abnormality - evaluation and management  Yamil Dougher V. 11/05/2015, 5:18 PM

## 2015-11-05 NOTE — Progress Notes (Signed)
Pt's sister, Carolan Clines, provided details for pt's living arrangements. Pt resides at [facility], [address] [place], [place] [address]. He lives independently. Pearl also provided a number for a friend, Candise Bowens, [phone], who also resides at the Assisted Living Facility.

## 2015-11-05 NOTE — Progress Notes (Addendum)
Initial Nutrition Assessment  DOCUMENTATION CODES:   Not applicable  INTERVENTION:    Initiate Vital AF 1.2 formula at 25 ml/hr and increase by 10 ml every 4 hours to goal rate of 55 ml/hr    Prostat liquid protein 30 ml TID  TF regimen + current Propofol infusion to provide 1958 kcals, 144 gm protein, 1071 ml of free water  NUTRITION DIAGNOSIS:   Inadequate oral intake related to inability to eat as evidenced by NPO status  GOAL:   Patient will meet greater than or equal to 90% of their needs  MONITOR:   TF tolerance, Vent status, Labs, Weight trends, I & O's  REASON FOR ASSESSMENT:   Consult  Enteral/tube feeding initiation and management  ASSESSMENT:   75 y.o. male with DM, HTN, CAD s/p CABG, chronic atrial fibrillation and HFrEF who presented to the emergency department with shortness of breath earlier today. History is obtained by reports and chart review as his family who is present in our ICU was not with him during these events.  Patient is currently intubated on ventilator support -- OGT in place MV: 9.9 L/min Temp (24hrs), Avg:98.3 F (36.8 C), Min:97 F (36.1 C), Max:99.4 F (37.4 C)   Propofol: 2.8 ml/hr ----> 74 fat kcals   CCM note reviewed.  Pt with acute on chronic congestive heart failure exacerbation with PEA arrest (likely respiratory etiology).  Nutrition focused physical exam completed.  No muscle or subcutaneous fat depletion noticed.  Diet Order:  Diet NPO time specified Except for: Sips with Meds  Skin:  Reviewed, no issues  Last BM:  N/A  Height:   Ht Readings from Last 1 Encounters:  11/04/15 5\' 11"  (1.803 m)    Weight:   Wt Readings from Last 1 Encounters:  11/05/15 205 lb 11 oz (93.3 kg)    Ideal Body Weight:  78.1 kg  BMI:  Body mass index is 28.7 kg/(m^2).  Estimated Nutritional Needs:   Kcal:  1958  Protein:  135-145 gm  Fluid:  per MD  EDUCATION NEEDS:   No education needs identified at this  time  Maureen Chatters, RD, LDN Pager #: [phone] After-Hours Pager #: [phone]

## 2015-11-05 NOTE — Progress Notes (Signed)
ANTICOAGULATION CONSULT NOTE  Pharmacy Consult for Heparin (while holding apixaban) Indication: chest pain/ACS and atrial fibrillation  Patient Measurements: Height: 5\' 11"  (180.3 cm) Weight: 205 lb 11 oz (93.3 kg) IBW/kg (Calculated) : 75.3  Vital Signs: Temp: 98.7 F (37.1 C) (04/04 0411) Temp Source: Axillary (04/04 0411) BP: 109/55 mmHg (04/04 0700) Pulse Rate: 45 (04/04 0700)  Labs:  Recent Labs  01-Dec-2015 1545 12-01-2015 1550 12/01/15 2225  December 01, 2015 2330 11/04/15 0340 11/04/15 0515 11/04/15 1752 11/05/15 0514  HGB 16.5 19.0*  --   --   --  13.8  --   --   --   HCT 49.4 56.0*  --   --   --  40.9  --   --   --   PLT 298  --   --   --   --  212  --   --   --   APTT  --   --   --   < > 33  --  35 100* 111*  LABPROT  --   --   --   --  15.5*  --   --   --   --   INR  --   --   --   --  1.22  --   --   --   --   HEPARINUNFRC  --   --   --   --   --   --  1.74*  --  1.32*  CREATININE 1.65* 1.40*  --   --   --  1.40*  --   --   --   TROPONINI 0.03  --  1.62*  --   --  3.13*  --   --   --   < > = values in this interval not displayed.  Estimated Creatinine Clearance: 52.4 mL/min (by C-G formula based on Cr of 1.4).   Medical History: Past Medical History  Diagnosis Date  . Diabetes mellitus without complication (HCC)     x  8 yrs.  . Hypertension   . CAD (coronary artery disease)     a. s/p CABG 2001. b. NSTEMI 08/2013 - cath done, med rx recommended.  . Atrial fibrillation (HCC)     a. Dx 08/2013. Placed on eliquis. Plan DCCV as outpt. Also has had WCT c/w aberrancy.  . Chronic combined systolic and diastolic CHF (congestive heart failure) (HCC)   . Acute respiratory failure (HCC)     a. 08/2013 - VDRF due to pulm edema.   Assessment: 75 y/o M on apixaban PTA for afib, transfer from [facility] after brief cardiac arrest, He is on IV heparin for NSTEMI/afib (apixaban is on hold). -aPTT= 111 (above goal) -heparin level= 1.32 (elevated due to influence of apixiban)  Goal of  Therapy:  Heparin level 0.3-0.7 units/ml aPTT 66-102 seconds Monitor platelets by anticoagulation protocol: Yes   Plan:  -Decrease heparin rate slightly to 1150 units/hr -f/u AM labs -Daily CBC/HL/aPTT -Dose using aPTT until correlation between aPTT and HL occurs   Harland German, Pharm D 11/05/2015 7:48 AM

## 2015-11-05 NOTE — Progress Notes (Signed)
ANTICOAGULATION CONSULT NOTE  Pharmacy Consult for Heparin (while holding apixaban) Indication: chest pain/ACS and atrial fibrillation  Patient Measurements: Height: 5\' 11"  (180.3 cm) Weight: 205 lb 11 oz (93.3 kg) IBW/kg (Calculated) : 75.3  Vital Signs: Temp: 98.7 F (37.1 C) (04/04 0411) Temp Source: Axillary (04/04 0411) BP: 109/55 mmHg (04/04 0700) Pulse Rate: 45 (04/04 0700)  Labs:  Recent Labs  11/08/2015 1545 11/13/2015 1550 11/10/2015 2225  11/28/2015 2330 11/04/15 0340 11/04/15 0515 11/04/15 1752 11/05/15 0514  HGB 16.5 19.0*  --   --   --  13.8  --   --   --   HCT 49.4 56.0*  --   --   --  40.9  --   --   --   PLT 298  --   --   --   --  212  --   --   --   APTT  --   --   --   < > 33  --  35 100* 111*  LABPROT  --   --   --   --  15.5*  --   --   --   --   INR  --   --   --   --  1.22  --   --   --   --   HEPARINUNFRC  --   --   --   --   --   --  1.74*  --  1.32*  CREATININE 1.65* 1.40*  --   --   --  1.40*  --   --   --   TROPONINI 0.03  --  1.62*  --   --  3.13*  --   --   --   < > = values in this interval not displayed.  Estimated Creatinine Clearance: 52.4 mL/min (by C-G formula based on Cr of 1.4).   Medical History: Past Medical History  Diagnosis Date  . Diabetes mellitus without complication (HCC)     x  8 yrs.  . Hypertension   . CAD (coronary artery disease)     a. s/p CABG 2001. b. NSTEMI 08/2013 - cath done, med rx recommended.  . Atrial fibrillation (HCC)     a. Dx 08/2013. Placed on eliquis. Plan DCCV as outpt. Also has had WCT c/w aberrancy.  . Chronic combined systolic and diastolic CHF (congestive heart failure) (HCC)   . Acute respiratory failure (HCC)     a. 08/2013 - VDRF due to pulm edema.   Assessment: 75 y/o M on apixaban PTA for afib, transfer from [facility] after brief cardiac arrest, He is on IV heparin for NSTEMI/afib (apixaban is on hold). -aPTT= 111 (above goal) -heparin level= 1.32 (elevated due to influence of apixiban)  Goal of  Therapy:  Heparin level 0.3-0.7 units/ml aPTT 66-102 seconds Monitor platelets by anticoagulation protocol: Yes   Plan:  -Decrease heparin rate slightly to 1100 units/hr -f/u AM labs -Daily CBC/HL/aPTT -Dose using aPTT until correlation between aPTT and HL occurs   Harland German, Pharm D 11/05/2015 7:51 AM

## 2015-11-05 NOTE — Patient Outreach (Signed)
Assessment:  Client is currently a patient at [facility] in [place]  Client had previously presented to the hospital with shortness of breath.   He had been admitted to the Intensive Care Unit at [facility].    He was intubated at the hospital.  CSW called [facility] on 11/05/15 and spoke via phone with Camelia Eng, RN at Intensive Care Unit at [facility]. CSW verified that Camelia Eng was RN at [facility]. CSW introduced self to Terri and informed her that CSW was seeking update information on client status/needs. Terri said that client still was receiving care at [facility] in the Intensive Care Unit. She said client was still intubated. She said that client had not been able to wean off ventilator as of yet. She said that she had not seen family members of client at the hospital but that a minister from [employer] had visited client recently.  CSW updated RN on client's home living situation. Client had been driving himself to his medical appointments and had been caring for his own needs with no family support. Client had been experiencing some financial challenges related to medical bills incurred. CSW had started talking with client about financial challenges of client.  Terri, RN, said she was not sure how long client might be at the Intensive Care Unit at [facility].  CSW gave Camelia Eng, Charity fundraiser, at [facility], CSW name and contact number.  CSW thanked Terri, Charity fundraiser for update information on client status at present.  Plan:  Client to cooperate with care providers for client at [facility] for the next 30 days. CSW to call client in two weeks to assess status and needs of client at that time.  [name].Soila Printup MSW, LCSW Licensed Clinical Social Worker [facility] Care Management [phone]      Plan:

## 2015-11-06 DIAGNOSIS — J9601 Acute respiratory failure with hypoxia: Principal | ICD-10-CM

## 2015-11-06 LAB — GLUCOSE, CAPILLARY
GLUCOSE-CAPILLARY: 211 mg/dL — AB (ref 65–99)
GLUCOSE-CAPILLARY: 276 mg/dL — AB (ref 65–99)
Glucose-Capillary: 205 mg/dL — ABNORMAL HIGH (ref 65–99)
Glucose-Capillary: 270 mg/dL — ABNORMAL HIGH (ref 65–99)
Glucose-Capillary: 275 mg/dL — ABNORMAL HIGH (ref 65–99)

## 2015-11-06 LAB — CBC
HCT: 35.9 % — ABNORMAL LOW (ref 39.0–52.0)
HEMOGLOBIN: 11.4 g/dL — AB (ref 13.0–17.0)
MCH: 29.2 pg (ref 26.0–34.0)
MCHC: 31.8 g/dL (ref 30.0–36.0)
MCV: 91.8 fL (ref 78.0–100.0)
Platelets: 196 10*3/uL (ref 150–400)
RBC: 3.91 MIL/uL — ABNORMAL LOW (ref 4.22–5.81)
RDW: 14.3 % (ref 11.5–15.5)
WBC: 8.8 10*3/uL (ref 4.0–10.5)

## 2015-11-06 LAB — BASIC METABOLIC PANEL
Anion gap: 10 (ref 5–15)
BUN: 21 mg/dL — ABNORMAL HIGH (ref 6–20)
CALCIUM: 8.5 mg/dL — AB (ref 8.9–10.3)
CHLORIDE: 105 mmol/L (ref 101–111)
CO2: 26 mmol/L (ref 22–32)
CREATININE: 1.24 mg/dL (ref 0.61–1.24)
GFR calc non Af Amer: 55 mL/min — ABNORMAL LOW (ref >60)
Glucose, Bld: 237 mg/dL — ABNORMAL HIGH (ref 65–99)
Potassium: 4.1 mmol/L (ref 3.5–5.1)
SODIUM: 141 mmol/L (ref 135–145)

## 2015-11-06 LAB — PHOSPHORUS: PHOSPHORUS: 3 mg/dL (ref 2.5–4.6)

## 2015-11-06 LAB — MAGNESIUM: MAGNESIUM: 1.9 mg/dL (ref 1.7–2.4)

## 2015-11-06 LAB — APTT: APTT: 78 s — AB (ref 24–37)

## 2015-11-06 LAB — TRIGLYCERIDES: Triglycerides: 236 mg/dL — ABNORMAL HIGH (ref <150)

## 2015-11-06 LAB — HEPARIN LEVEL (UNFRACTIONATED): HEPARIN UNFRACTIONATED: 0.83 [IU]/mL — AB (ref 0.30–0.70)

## 2015-11-06 MED ORDER — FUROSEMIDE 10 MG/ML IJ SOLN
80.0000 mg | Freq: Once | INTRAMUSCULAR | Status: AC
  Administered 2015-11-06: 80 mg via INTRAVENOUS

## 2015-11-06 MED ORDER — FUROSEMIDE 10 MG/ML IJ SOLN
40.0000 mg | Freq: Two times a day (BID) | INTRAMUSCULAR | Status: DC
  Administered 2015-11-06 – 2015-11-07 (×3): 40 mg via INTRAVENOUS
  Filled 2015-11-06 (×3): qty 4

## 2015-11-06 MED ORDER — FAMOTIDINE 40 MG/5ML PO SUSR
20.0000 mg | Freq: Two times a day (BID) | ORAL | Status: DC
  Administered 2015-11-06 – 2015-11-12 (×13): 20 mg via ORAL
  Filled 2015-11-06 (×13): qty 2.5

## 2015-11-06 MED ORDER — FUROSEMIDE 10 MG/ML IJ SOLN
INTRAMUSCULAR | Status: AC
  Filled 2015-11-06: qty 8

## 2015-11-06 MED ORDER — FUROSEMIDE 10 MG/ML IJ SOLN: 80.0000 mg | Freq: Once | INTRAMUSCULAR | Status: DC

## 2015-11-06 MED ORDER — POTASSIUM CHLORIDE 20 MEQ PO PACK
40.0000 meq | PACK | Freq: Two times a day (BID) | ORAL | Status: AC
  Administered 2015-11-06 (×2): 40 meq
  Filled 2015-11-06 (×3): qty 2

## 2015-11-06 MED ORDER — INSULIN GLARGINE 100 UNIT/ML ~~LOC~~ SOLN
15.0000 [IU] | Freq: Every day | SUBCUTANEOUS | Status: DC
  Administered 2015-11-06 – 2015-11-08 (×3): 15 [IU] via SUBCUTANEOUS
  Filled 2015-11-06 (×5): qty 0.15

## 2015-11-06 NOTE — Progress Notes (Signed)
PULMONARY / CRITICAL CARE MEDICINE   Name: Randy Nunez MRN: [id number] DOB: [birth year]    ADMISSION DATE:  11/02/2015  CHIEF COMPLAINT:  Respiratory failure and cardiac arrest  HISTORY OF PRESENT ILLNESS:   Randy Nunez is a 75 y.o. male with diabetes mellitus, hypertenstion, CAD s/p CABG, chronic atrial fibrillation and HFrEF who presented to the emergency department with shortness of breath earlier today. History is obtained by reports and chart review as his family who is present in our ICU was not with him during these events.  Randy Nunez reported severe shortness of breath after eating this afternoon with the feeling of being "full" after being normal this morning. He eventually started sweating and asked his neighbor in his assisted care living center to take him to the ED for breathing difficulties. He was conversant when he arrived in the ED, but after transfer to the ED room, he was noted to be in extremis, ashen colored and lost his pulse. CPR was initiated for PEA cardiac arrest and the patient had ROSC after intubation without the need for ACLS medications or shocks. Immediately after intubation he was noted to have an irregular wide complex tachycardia and was given a bolus of amiodarone followed by an infusion. It was noted he had pink frothy sputum in his ETT. He was then transferred to [facility] ICU.    SUBJECTIVE:  No progress diuresis last 24h Developed frothy secretions when placed on PSV w PEEP 8 this am > back to [facility]   VITAL SIGNS: BP 127/99 mmHg  Pulse 120  Temp(Src) 98.4 F (36.9 C) (Oral)  Resp 24  Ht 5\' 11"  (1.803 m)  Wt 94.9 kg (209 lb 3.5 oz)  BMI 29.19 kg/m2  SpO2 100%  HEMODYNAMICS:    VENTILATOR SETTINGS: Vent Mode:  [-] PRVC FiO2 (%):  [40 %-100 %] 100 % Set Rate:  [16 bmp] 16 bmp Vt Set:  [550 mL] 550 mL PEEP:  [8 cmH20] 8 cmH20 Pressure Support:  [5 cmH20] 5 cmH20 Plateau Pressure:  [16 cmH20-23 cmH20] 20 cmH20  INTAKE / OUTPUT: I/O last 3  completed shifts: In: 3115 [I.V.:1853.3; NG/GT:861.7; IV Piggyback:400] Out: 2565 [Urine:2565]  PHYSICAL EXAMINATION: General: Sedated, moving extremities spontaneously, not interactive, intubated Neuro: Low tone in all extremities, pupils ~74mm bilaterally and reactive HEENT: Moist mucous membranes, clear tongue, ETT in place Cardiovascular: Irregularly irregular, no murmurs, previous sternotomy scar noted, afib rvr 120's Lungs: Crackles present bilaterally, particularly in the bases Abdomen: Obese, soft, nontender, no appreciable hepatosplenomegaly Musculoskeletal: Warm extremities, 2+ pitting edema in legs Skin: Hyperpigmented skin in feet  LABS:  BMET  Recent Labs Lab 11/04/15 0340 11/05/15 0857 11/06/15 0420  NA 142 143 141  K 3.1* 3.0* 4.1  CL 104 106 105  CO2 28 27 26   BUN 24* 17 21*  CREATININE 1.40* 1.34* 1.24  GLUCOSE 205* 119* 237*    Electrolytes  Recent Labs Lab 11/04/15 0340 11/05/15 0857 11/06/15 0420  CALCIUM 8.1* 8.3* 8.5*  MG  --   --  1.9  PHOS  --   --  3.0    CBC  Recent Labs Lab 11/25/2015 1545 11/10/2015 1550 11/04/15 0340 11/06/15 0420  WBC 26.8*  --  17.0* 8.8  HGB 16.5 19.0* 13.8 11.4*  HCT 49.4 56.0* 40.9 35.9*  PLT 298  --  212 196    Coag's  Recent Labs Lab 11/05/2015 2330  11/04/15 1752 11/05/15 0514 11/06/15 0420  APTT 33  < > 100*  111* 78*  INR 1.22  --   --   --   --   < > = values in this interval not displayed.  Sepsis Markers  Recent Labs Lab November 14, 2015 1630 Nov 14, 2015 2030 11/04/15 0340  LATICACIDVEN 8.40* 2.6* 1.9    ABG  Recent Labs Lab Nov 14, 2015 1620 14-Nov-2015 2038  PHART 7.221* 7.360  PCO2ART 50.6* 46.1*  PO2ART 116* 77.0*    Liver Enzymes  Recent Labs Lab November 14, 2015 1545 11/04/15 0340  AST 140* 94*  ALT 80* 70*  ALKPHOS 117 76  BILITOT 0.8 0.9  ALBUMIN 4.1 3.0*    Cardiac Enzymes  Recent Labs Lab 11/28/2015 2225 11/04/15 0340 11/05/15 0857  TROPONINI 1.62* 3.13* 1.66*     Glucose  Recent Labs Lab 11/05/15 1302 11/05/15 1703 11/05/15 1926 11/05/15 2339 11/06/15 0401 11/06/15 0756  GLUCAP 140* 134* 132* 184* 211* 205*    Imaging Dg Chest Port 1 View  11/05/2015  CLINICAL DATA:  Respiratory failure. EXAM: PORTABLE CHEST 1 VIEW COMPARISON:  Nov 14, 2015 FINDINGS: Endotracheal tube, right central line and NG tube remain in place, unchanged. Prior CABG. Heart is mildly enlarged with vascular congestion. Interstitial prominence may reflect interstitial edema, improving since prior study. No confluent opacities or effusions. IMPRESSION: Improving interstitial edema pattern. Support devices are stable. Electronically Signed   By: Charlett Nose M.D.   On: 11/05/2015 11:06     STUDIES:   CULTURES: Resp 4/2 >> normal flora Blood 4/2 >>  Urine 4/2 >> negative  ANTIBIOTICS: vanco 4/2 >> 4/3 Aztreonam 4/2 >> 4/3 levaquin 4/2 >> 4/5  SIGNIFICANT EVENTS:  LINES/TUBES: ETT 4/2 >>  Right Internal jugular CVC 4/2 >>  Foley catheter 4/2 >>  OG tube 4/2 >>   DISCUSSION: Randy Nunez is a 75 y.o. male with diabetes mellitus, hypertenstion, CAD s/p CABG, chronic atrial fibrillation and HFrEF who presented to the emergency department with shortness of breath 4/2 with subsequent PEA cardiac arrest and intubation. Clinical picture consistent with cardiogenic edema.    Intake/Output Summary (Last 24 hours) at 11/06/15 0909 Last data filed at 11/06/15 0600  Gross per 24 hour  Intake 1981.02 ml  Output   1340 ml  Net 641.02 ml    PULMONARY A: Acute hypoxemic respiratory failure Acute cardiogenic pulmonary edema Possible HCAP, doubt P:   PRVC 8cc/kg Wean PEEP and Fio2 as able > will increase PEEP back to 10 4/5, wean FiO2 as able. Do not believe he is in a position for SBT until more volume removal Empiric abx as below; low threshold to d/c Diuresis as he can tolerate > increase lasix  4/5  CARDIOVASCULAR A:  Acute on chronic systolic congestive  heart failure exacerbation PEA arrest (likely respiratory etiology) Chronic atrial fibrillation (narrow complex) , tachycardic NSTEMI, ? Stress-related  Intermittent LBBB P:  Amiodarone  ASA, heparin, statin,  Increase lasix 4/5 Cardiology following, no plan for L heart cath at this time. TTE deferred, ? Timing repeat  RENAL Lab Results  Component Value Date   CREATININE 1.24 11/06/2015   CREATININE 1.34* 11/05/2015   CREATININE 1.40* 11/04/2015    A:   Acute on chronic kidney failure Hypokalemia  P:   Replace electrolytes as indicated Follow BMP Increase lasix to 80 q12h 4/5, minimize IVF as able   GASTROINTESTINAL A:   SUP P:   PPI TF vital 1.2  HEMATOLOGIC A:   anticoag for A fib and ACS P:  Heparin gtt   INFECTIOUS A:   Possible HCAP  P:   Empiric abx started 4/2 D/c levaquin 4/5   ENDOCRINE CBG (last 3)   Recent Labs  11/05/15 2339 11/06/15 0401 11/06/15 0756  GLUCAP 184* 211* 205*  A:   DM P:   SSI per protocol  NEUROLOGIC A:   S/p Cardiac arrest Sedation for MV P:   RASS goal: -1 Given short duration, fact that he did not require ACLS will defer hypothermia Propofol + fentanyl prn.    FAMILY  - Updates: Updated his two sisters, two daughters and their spouses at bedside on 4/2. Will continue FULL CODE for now after discussion with family; but it is noted that he stated on 09/12/15 that he did not want to be placed on the mechanical ventilator (DNR/DNI) to Dr Ross Marcus. Will readdress with patient once he has capacity. 4/4 no family at bedside.  - Inter-disciplinary family meet or Palliative Care meeting due by:  11/10/15  Independent CC time 40 minutes  Levy Pupa, MD, PhD 11/06/2015, 9:09 AM [facility] [phone] or if no answer [phone]

## 2015-11-06 NOTE — Progress Notes (Addendum)
Inpatient Diabetes Program Recommendations  AACE/ADA: New Consensus Statement on Inpatient Glycemic Control (2015)  Target Ranges:  Prepandial:   less than 140 mg/dL      Peak postprandial:   less than 180 mg/dL (1-2 hours)      Critically ill patients:  140 - 180 mg/dL   Review of Glycemic Control:  Results for KHASE, KAPPEL (MRN [id number]) as of 11/06/2015 11:27  Ref. Range 11/05/2015 19:26 11/05/2015 23:39 11/06/2015 04:01 11/06/2015 07:56  Glucose-Capillary Latest Ref Range: 65-99 mg/dL 301 (H) 601 (H) 093 (H) 205 (H)   Diabetes history: Type 2 diabetes Outpatient Diabetes medications: Novolog 10 units tid with meals, Levemir 20 units daily at HS Current orders for Inpatient glycemic control:  Novolog moderate q 4 hours, Lantus 5 units daily, Vital tube feed 55 cc/hr Inpatient Diabetes Program Recommendations:    May consider adding Novolog tube feed coverage 2 units q 4 hours. Also may consider increasing Lantus to 10 units daily.  Thanks, Beryl Meager, RN, BC-ADM Inpatient Diabetes Coordinator Pager [phone] (8a-5p)

## 2015-11-06 NOTE — Progress Notes (Signed)
Patient Name: Randy Nunez Date of Encounter: 11/06/2015  Active Problems:   Essential hypertension, benign   Acute on chronic combined systolic and diastolic CHF (congestive heart failure) ([place])   Atrial fibrillation ([place])   Cardiac arrest ([place])   Acute congestive heart failure ([place])   Respiratory arrest ([place])   Length of Stay: 3  SUBJECTIVE  The patient is intubated and sedated.   CURRENT MEDS . antiseptic oral rinse  7 mL Mouth Rinse QID  . aspirin  81 mg Per Tube Daily  . chlorhexidine gluconate (SAGE KIT)  15 mL Mouth Rinse BID  . famotidine (PEPCID) IV  20 mg Intravenous Q24H  . feeding supplement (PRO-STAT SUGAR FREE 64)  30 mL Per Tube TID  . furosemide      . furosemide  80 mg Intravenous Once  . insulin aspart  0-15 Units Subcutaneous 6 times per day  . insulin glargine  5 Units Subcutaneous Daily  . metoprolol succinate  50 mg Oral Q0600  . potassium chloride  40 mEq Per Tube BID  . pravastatin  40 mg Oral q1800   . amiodarone 30 mg/hr (11/06/15 1000)  . diltiazem (CARDIZEM) infusion Stopped (11/05/15 1200)  . feeding supplement (VITAL AF 1.2 CAL) 1,000 mL (11/06/15 1000)  . heparin 1,100 Units/hr (11/06/15 1000)  . insulin (NOVOLIN-R) infusion Stopped (11/05/15 0700)  . propofol (DIPRIVAN) infusion 30 mcg/kg/min (11/06/15 1001)   OBJECTIVE  Filed Vitals:   11/06/15 0915 11/06/15 0930 11/06/15 0945 11/06/15 1000  BP: 108/75 105/76 113/67 159/84  Pulse: 99     Temp:      TempSrc:      Resp: _0 Height:      Weight:      SpO2: 100%   100%    Intake/Output Summary (Last 24 hours) at 11/06/15 1116 Last data filed at 11/06/15 0900  Gross per 24 hour  Intake 2192.47 ml  Output   1450 ml  Net 742.47 ml   Filed Weights   11/04/15 0500 11/05/15 0300 11/06/15 0500  Weight: 213 lb 3 oz (96.7 kg) 205 lb 11 oz (93.3 kg) 209 lb 3.5 oz (94.9 kg)   PHYSICAL EXAM  General: intubated sedated Psych: Normal affect. HEENT:  Normal  Neck: Supple  without bruits or JVD. Lungs:  Resp regular and unlabored, some rales at bases. Heart: RRR no s3, s4, or murmurs. Abdomen: Soft, non-tender, non-distended, BS + x 4.  Extremities: No clubbing, cyanosis or edema. DP/PT/Radials 2+ and equal bilaterally.  Accessory Clinical Findings  CBC  Recent Labs  11/28/2015 1545  11/04/15 0340 11/06/15 0420  WBC 26.8*  --  17.0* 8.8  NEUTROABS 9.4*  --  13.6*  --   HGB 16.5  < > 13.8 11.4*  HCT 49.4  < > 40.9 35.9*  MCV 93.0  --  89.7 91.8  PLT 298  --  212 196  < > = values in this interval not displayed. Basic Metabolic Panel  Recent Labs  11/05/15 0857 11/06/15 0420  NA 143 141  K 3.0* 4.1  CL 106 105  CO2 27 26  GLUCOSE 119* 237*  BUN 17 21*  CREATININE 1.34* 1.24  CALCIUM 8.3* 8.5*  MG  --  1.9  PHOS  --  3.0   Liver Function Tests  Recent Labs  11/10/2015 1545 11/04/15 0340  AST 140* 94*  ALT 80* 70*  ALKPHOS 117 76  BILITOT 0.8 0.9  PROT 7.6 5.7*  ALBUMIN 4.1 3.0*    Recent Labs  11/07/2015 2225 11/04/15 0340 11/05/15 0857  TROPONINI 1.62* 3.13* 1.66*    Recent Labs  11/09/2015 2225  TRIG 357*   Dg Chest Port 1 View  11/05/2015  CLINICAL DATA:  Respiratory failure. EXAM: PORTABLE CHEST 1 VIEW COMPARISON:  11/05/2015 FINDINGS: Endotracheal tube, right central line and NG tube remain in place, unchanged. Prior CABG. Heart is mildly enlarged with vascular congestion. Interstitial prominence may reflect interstitial edema, improving since prior study. No confluent opacities or effusions. IMPRESSION: Improving interstitial edema pattern. Support devices are stable. Electronically Signed   By: Rolm Baptise M.D.   On: 11/05/2015 11:06   Dg Chest Portable 1 View  11/21/2015  CLINICAL DATA:  Acute respiratory failure.  Central line placement. EXAM: PORTABLE CHEST 1 VIEW COMPARISON:  11/08/2015 FINDINGS: The endotracheal tube and NG tubes are stable. New right IJ central venous catheter tip is in the distal SVC near the  cavoatrial junction. No complicating features. Persistent cardiac enlargement and diffuse pulmonary edema. IMPRESSION: Right IJ center venous catheter tip is in the distal SVC. The endotracheal tube and NG tubes are stable. Persistent pulmonary edema. Electronically Signed   By: Marijo Sanes M.D.   On: 11/25/2015 17:39   Dg Chest Portable 1 View  11/10/2015  CLINICAL DATA:  Sudden shortness of breath, diaphoretic. EXAM: PORTABLE CHEST 1 VIEW COMPARISON:  Chest x-rays dated 09/12/2015 and 08/22/2013. FINDINGS: Cardiomegaly is stable. Median sternotomy wires are stable in alignment. Endotracheal tube appears well positioned with tip just above the level of the carina. Enteric tube passes below the diaphragm. There is central pulmonary vascular congestion and bilateral interstitial edema suggesting volume overload/ CHF. No pleural effusion or pneumothorax seen. IMPRESSION: 1. Cardiomegaly with central pulmonary vascular congestion and bilateral interstitial edema indicating CHF/volume overload. 2. Endotracheal tube well positioned with tip approximately 2 cm above the level of the carina. Electronically Signed   By: Franki Cabot M.D.   On: 11/09/2015 15:57   TELE: a-fib with RVR   ASSESSMENT AND PLAN  # NSTEMI - Troponin elevation in the setting of respiratory failure - The patient required intubation on 4/2 sec to respiratory failure, he is intubated and sedated, follows commends, denies chest pain.  We don't know his code status, family doesn't know, we will wait until he is more stable and extubated to make the decision. - troponin downtrending peaked at 3.13 --> 1.66 - meanwhile continue metoprolol, pravastatin, aspirin - repeat TTE to evaluate for wall motion abnormalities,  # PEA Arrest - May have been due to above versus primary respiratory etiology, such as pulmonary edema or pneumonia.  - We accordingly agree with diuretic & antimicrobial efforts.  - Though there is not overt evidence  of VT, we are not opposed to the continuation of Amiodarone for now.  # h/o Chronic Afib - We have transitioned him from Apixaban to Heparin in the setting of ACS.  - continue iv amiodarone as he has RVR - started diltiazem drip yesterday, now rate better controlled  # h/o HFrEF - Mildly volume overloaded on physical exam. - start lasix 40 mg iv BID, Crea improving 1.24  # h/o HTN - controlled  Signed, Dorothy Spark MD, [facility] 11/06/2015

## 2015-11-06 NOTE — Progress Notes (Signed)
Event note:  0810-present.  Pt was awake and alert without distress.  Just after recent repositioning and start of cpap trial, pt developed flash pulmonary edema with copious thick frothy secretions.  Notified RT who stopped trial and placed pt back on full support.  Notified Dr. Delton Coombes who ordered 80mg  iv lasix.  Gave lasix waiting for diuresis.  Pt seems to be settling out at this time and no distress noted.

## 2015-11-06 NOTE — Progress Notes (Signed)
Placed pt on wean of PS 5, PEEP 8, 40% within a couple of minutes pt became diaphoretic, large amounts of frothy, pink tinged secretions, HR 140's and desat.  Placed back on vent full support and 100% FIO2.

## 2015-11-06 NOTE — Progress Notes (Signed)
eLink Physician-Brief Progress Note Patient Name: Randy Nunez DOB: 1940/12/26 MRN: [id number]   Date of Service  11/06/2015  HPI/Events of Note  CBGs high  eICU Interventions  Increase lantus to 15 U bedtime      Intervention Category Intermediate Interventions: Hyperglycemia - evaluation and treatment  ALVA,RAKESH V. 11/06/2015, 7:28 PM

## 2015-11-06 NOTE — Progress Notes (Signed)
ANTICOAGULATION CONSULT NOTE  Pharmacy Consult for Heparin (while holding apixaban) Indication: chest pain/ACS and atrial fibrillation  Patient Measurements: Height: 5\' 11"  (180.3 cm) Weight: 209 lb 3.5 oz (94.9 kg) IBW/kg (Calculated) : 75.3  Vital Signs: Temp: 98.9 F (37.2 C) (04/05 1200) Temp Source: Oral (04/05 1200) BP: 101/69 mmHg (04/05 1200) Pulse Rate: 103 (04/05 1200)  Labs:  Recent Labs  11/07/2015 1545 11/05/2015 1550 11/13/2015 2225  11/20/2015 2330 11/04/15 0340 11/04/15 0515 11/04/15 1752 11/05/15 0514 11/05/15 0857 11/06/15 0420  HGB 16.5 19.0*  --   --   --  13.8  --   --   --   --  11.4*  HCT 49.4 56.0*  --   --   --  40.9  --   --   --   --  35.9*  PLT 298  --   --   --   --  212  --   --   --   --  196  APTT  --   --   --   < > 33  --  35 100* 111*  --  78*  LABPROT  --   --   --   --  15.5*  --   --   --   --   --   --   INR  --   --   --   --  1.22  --   --   --   --   --   --   HEPARINUNFRC  --   --   --   --   --   --  1.74*  --  1.32*  --  0.83*  CREATININE 1.65* 1.40*  --   --   --  1.40*  --   --   --  1.34* 1.24  TROPONINI 0.03  --  1.62*  --   --  3.13*  --   --   --  1.66*  --   < > = values in this interval not displayed.  Estimated Creatinine Clearance: 59.6 mL/min (by C-G formula based on Cr of 1.24).   Medical History: Past Medical History  Diagnosis Date  . Diabetes mellitus without complication (HCC)     x  8 yrs.  . Hypertension   . CAD (coronary artery disease)     a. s/p CABG 2001. b. NSTEMI 08/2013 - cath done, med rx recommended.  . Atrial fibrillation (HCC)     a. Dx 08/2013. Placed on eliquis. Plan DCCV as outpt. Also has had WCT c/w aberrancy.  . Chronic combined systolic and diastolic CHF (congestive heart failure) (HCC)   . Acute respiratory failure (HCC)     a. 08/2013 - VDRF due to pulm edema.   Assessment: 75 y/o M on apixaban PTA for afib, [facility] from [facility] after brief cardiac arrest. He is on IV heparin for  NSTEMI/afib (apixaban is on hold) and also noted with VDRF. -aPTT= 78 (at goal) -heparin level= 0.83 (elevated due to influence of apixiban)  Goal of Therapy:  Heparin level 0.3-0.7 units/ml aPTT 66-102 seconds Monitor platelets by anticoagulation protocol: Yes   Plan:  -No heparin changes needed -Daily CBC/HL/aPTT -Dose using aPTT until correlation between aPTT and HL occurs   Harland German, Pharm D 11/06/2015 12:57 PM

## 2015-11-06 NOTE — Care Management Important Message (Signed)
Important Message  Patient Details  Name: Randy Nunez MRN: [id number] Date of Birth: 01/03/1941   Medicare Important Message Given:  Yes    Kyla Balzarine 11/06/2015, 11:40 AM

## 2015-11-07 ENCOUNTER — Inpatient Hospital Stay (HOSPITAL_COMMUNITY): Payer: PPO

## 2015-11-07 LAB — GLUCOSE, CAPILLARY
GLUCOSE-CAPILLARY: 266 mg/dL — AB (ref 65–99)
GLUCOSE-CAPILLARY: 268 mg/dL — AB (ref 65–99)
GLUCOSE-CAPILLARY: 274 mg/dL — AB (ref 65–99)
Glucose-Capillary: 266 mg/dL — ABNORMAL HIGH (ref 65–99)
Glucose-Capillary: 266 mg/dL — ABNORMAL HIGH (ref 65–99)
Glucose-Capillary: 269 mg/dL — ABNORMAL HIGH (ref 65–99)

## 2015-11-07 LAB — BASIC METABOLIC PANEL
Anion gap: 11 (ref 5–15)
BUN: 32 mg/dL — ABNORMAL HIGH (ref 6–20)
CALCIUM: 8.5 mg/dL — AB (ref 8.9–10.3)
CO2: 26 mmol/L (ref 22–32)
CREATININE: 1.53 mg/dL — AB (ref 0.61–1.24)
Chloride: 104 mmol/L (ref 101–111)
GFR, EST AFRICAN AMERICAN: 49 mL/min — AB (ref >60)
GFR, EST NON AFRICAN AMERICAN: 42 mL/min — AB (ref >60)
GLUCOSE: 276 mg/dL — AB (ref 65–99)
Potassium: 3.9 mmol/L (ref 3.5–5.1)
Sodium: 141 mmol/L (ref 135–145)

## 2015-11-07 LAB — MAGNESIUM: Magnesium: 2 mg/dL (ref 1.7–2.4)

## 2015-11-07 LAB — APTT: APTT: 78 s — AB (ref 24–37)

## 2015-11-07 LAB — HEPARIN LEVEL (UNFRACTIONATED): HEPARIN UNFRACTIONATED: 0.59 [IU]/mL (ref 0.30–0.70)

## 2015-11-07 MED ORDER — PROPOFOL 1000 MG/100ML IV EMUL
5.0000 ug/kg/min | INTRAVENOUS | Status: DC
  Administered 2015-11-07: 40 ug/kg/min via INTRAVENOUS
  Administered 2015-11-07: 45 ug/kg/min via INTRAVENOUS
  Administered 2015-11-08 (×2): 35 ug/kg/min via INTRAVENOUS
  Filled 2015-11-07 (×4): qty 100

## 2015-11-07 MED ORDER — FUROSEMIDE 10 MG/ML IJ SOLN
60.0000 mg | Freq: Two times a day (BID) | INTRAMUSCULAR | Status: DC
  Administered 2015-11-07 – 2015-11-08 (×2): 60 mg via INTRAVENOUS
  Filled 2015-11-07 (×2): qty 6

## 2015-11-07 MED ORDER — POTASSIUM CHLORIDE 20 MEQ/15ML (10%) PO SOLN
40.0000 meq | Freq: Every day | ORAL | Status: DC
  Administered 2015-11-07 – 2015-11-10 (×4): 40 meq
  Filled 2015-11-07 (×5): qty 30

## 2015-11-07 NOTE — Progress Notes (Signed)
Inpatient Diabetes Program Recommendations  AACE/ADA: New Consensus Statement on Inpatient Glycemic Control (2015)  Target Ranges:  Prepandial:   less than 140 mg/dL      Peak postprandial:   less than 180 mg/dL (1-2 hours)      Critically ill patients:  140 - 180 mg/dL   Review of Glycemic Control Results for MARON, ROSHTO (MRN [id number]) as of 11/07/2015 12:24  Ref. Range 11/06/2015 17:06 11/06/2015 19:24 11/06/2015 23:50 11/07/2015 04:00 11/07/2015 08:08 11/07/2015 11:37  Glucose-Capillary Latest Ref Range: 65-99 mg/dL 263 (H) 785 (H) 885 (H) 266 (H) 266 (H) 269 (H)    Inpatient Diabetes Program Recommendations:  Noted increase in Lantus to 15 units daily. May consider adding Novolog tube feed coverage 2 units q 4 hours.  Thank you, Billy Fischer. Jerris Fleer, RN, MSN, CDE Inpatient Glycemic Control Team Team Pager [phone] (8am-5pm) 11/07/2015 12:50 PM

## 2015-11-07 NOTE — Care Management Note (Addendum)
Case Management Note  Patient Details  Name: HERBERTO ROLON MRN: [id number] Date of Birth: 22-Apr-1941  Subjective/Objective:                    Action/Plan:  11/05/15:  Sister Deloris contacted CM and informed CM that pt recently moved into a low income housing community in [place] named [facility]; not assisted living - pt completely independent.    Per bedside nurse; pt is from an assisted living facility.  Pt is intubated.  CM contacted both sister Delois and daughter listed in shadow chart; both said they did not know what facility.  Sister instructed CM to contact pts other sister Carolan Clines at [phone] -  CM left voice mail requesting call back.   Expected Discharge Date:                  Expected Discharge Plan:   (Per sister:  from low income housing; not assisted living)  In-House Referral:  Clinical Social Work  Economist  CM Consult  Post Acute Care Choice:    Choice offered to:     DME Arranged:    DME Agency:     HH Arranged:    HH Agency:     Status of Service:  In process, will continue to follow  Medicare Important Message Given:  Yes Date Medicare IM Given:    Medicare IM give by:    Date Additional Medicare IM Given:    Additional Medicare Important Message give by:     If discussed at Long Length of Stay Meetings, dates discussed:    Additional Comments:  Cherylann Parr, RN 11/07/2015, 9:31 AM

## 2015-11-07 NOTE — Progress Notes (Signed)
Inpatient Diabetes Program Recommendations  AACE/ADA: New Consensus Statement on Inpatient Glycemic Control (2015)  Target Ranges:  Prepandial:   less than 140 mg/dL      Peak postprandial:   less than 180 mg/dL (1-2 hours)      Critically ill patients:  140 - 180 mg/dL   Inpatient Diabetes Program Recommendations:  Spoke with RN and discussed elevated CBGS and recommendations of Novolog 2 units q 4 hrs. Nurse to discuss with physician.  Billy Fischer Kashlyn Salinas, RN, MSN, CDE Inpatient Glycemic Control Team Team Pager [phone] (8am-5pm) 11/07/2015 3:23 PM

## 2015-11-07 NOTE — Progress Notes (Signed)
11/07/2015 10:05 AM Nursing note Pt. Sister, Vita Erm contacted per request Dr. Delton Coombes and family meeting arranged for tomorrow 11/08/15 at 0900 to update on plan of care. Pt. Sister to notify remaining family members to be in attendance. Will continue to closely monitor patient.  Zohal Reny, Blanchard Kelch

## 2015-11-07 NOTE — Progress Notes (Signed)
PULMONARY / CRITICAL CARE MEDICINE   Name: Randy Nunez MRN: [id number] DOB: [birth year]    ADMISSION DATE:  2015/11/26  CHIEF COMPLAINT:  Respiratory failure and cardiac arrest  HISTORY OF PRESENT ILLNESS:   Randy Nunez is a 75 y.o. male with diabetes mellitus, hypertenstion, CAD s/p CABG, chronic atrial fibrillation and HFrEF who presented to the emergency department with shortness of breath earlier today. History is obtained by reports and chart review as his family who is present in our ICU was not with him during these events.  Randy Nunez reported severe shortness of breath after eating this afternoon with the feeling of being "full" after being normal this morning. He eventually started sweating and asked his neighbor in his assisted care living center to take him to the ED for breathing difficulties. He was conversant when he arrived in the ED, but after transfer to the ED room, he was noted to be in extremis, ashen colored and lost his pulse. CPR was initiated for PEA cardiac arrest and the patient had ROSC after intubation without the need for ACLS medications or shocks. Immediately after intubation he was noted to have an irregular wide complex tachycardia and was given a bolus of amiodarone followed by an infusion. It was noted he had pink frothy sputum in his ETT. He was then transferred to [facility] ICU.    SUBJECTIVE:  Fio2 0.60, PEEP 10 Sedated, no SBT this am given vent settings  VITAL SIGNS: BP 82/53 mmHg  Pulse 68  Temp(Src) 98.3 F (36.8 C) (Axillary)  Resp 16  Ht 5\' 11"  (1.803 m)  Wt 93.9 kg (207 lb 0.2 oz)  BMI 28.89 kg/m2  SpO2 100%  HEMODYNAMICS:    VENTILATOR SETTINGS: Vent Mode:  [-] PRVC FiO2 (%):  [60 %-100 %] 60 % Set Rate:  [16 bmp] 16 bmp Vt Set:  [550 mL] 550 mL PEEP:  [10 cmH20] 10 cmH20 Plateau Pressure:  [15 cmH20-24 cmH20] 23 cmH20  INTAKE / OUTPUT: I/O last 3 completed shifts: In: 4319.6 [I.V.:1894.6; NG/GT:2225; IV Piggyback:200] Out:  3505 [Urine:3505]  PHYSICAL EXAMINATION: General: Sedated, moving extremities spontaneously, not interactive, intubated Neuro: Low tone in all extremities, pupils ~53mm bilaterally and reactive HEENT: Moist mucous membranes, ETT in place Cardiovascular: Irregularly irregular, no murmurs, previous sternotomy scar noted, afib 90's Lungs: Crackles present bilaterally, particularly in the bases Abdomen: Obese, soft, nontender, no appreciable hepatosplenomegaly Musculoskeletal: Warm extremities, 2+ pitting edema in legs Skin: Hyperpigmented skin in feet  LABS:  BMET  Recent Labs Lab 11/05/15 0857 11/06/15 0420 11/07/15 0423  NA 143 141 141  K 3.0* 4.1 3.9  CL 106 105 104  CO2 27 26 26   BUN 17 21* 32*  CREATININE 1.34* 1.24 1.53*  GLUCOSE 119* 237* 276*    Electrolytes  Recent Labs Lab 11/05/15 0857 11/06/15 0420 11/07/15 0423  CALCIUM 8.3* 8.5* 8.5*  MG  --  1.9 2.0  PHOS  --  3.0  --     CBC  Recent Labs Lab 11/26/2015 1545 26-Nov-2015 1550 11/04/15 0340 11/06/15 0420  WBC 26.8*  --  17.0* 8.8  HGB 16.5 19.0* 13.8 11.4*  HCT 49.4 56.0* 40.9 35.9*  PLT 298  --  212 196    Coag's  Recent Labs Lab 2015/11/26 2330  11/05/15 0514 11/06/15 0420 11/07/15 0423  APTT 33  < > 111* 78* 78*  INR 1.22  --   --   --   --   < > = values  in this interval not displayed.  Sepsis Markers  Recent Labs Lab Nov 06, 2015 1630 11-06-2015 2030 11/04/15 0340  LATICACIDVEN 8.40* 2.6* 1.9    ABG  Recent Labs Lab 11/06/15 1620 November 06, 2015 2038  PHART 7.221* 7.360  PCO2ART 50.6* 46.1*  PO2ART 116* 77.0*    Liver Enzymes  Recent Labs Lab 11-06-2015 1545 11/04/15 0340  AST 140* 94*  ALT 80* 70*  ALKPHOS 117 76  BILITOT 0.8 0.9  ALBUMIN 4.1 3.0*    Cardiac Enzymes  Recent Labs Lab 11-06-15 2225 11/04/15 0340 11/05/15 0857  TROPONINI 1.62* 3.13* 1.66*    Glucose  Recent Labs Lab 11/06/15 1155 11/06/15 1706 11/06/15 1924 11/06/15 2350 11/07/15 0400  11/07/15 0808  GLUCAP 270* 275* 276* 268* 266* 266*    Imaging Dg Chest Port 1 View  11/07/2015  CLINICAL DATA:  Ventilator dependent respiratory failure EXAM: PORTABLE CHEST 1 VIEW COMPARISON:  Portable chest x-ray of 11/05/2015 and 2015/11/06 FINDINGS: There is little change in probable interstitial edema pattern with small effusions and cardiomegaly. The tip of the endotracheal tube is only 1.4 cm above the carina. Right IJ central venous line overlies the lower SVC and and NG tube coils in the stomach. Mild cardiomegaly is stable. IMPRESSION: 1. Little change in interstitial edema pattern. 2. Tip of endotracheal tube only 1.4 cm above the carina. Electronically Signed   By: Dwyane Dee M.D.   On: 11/07/2015 08:13     STUDIES:   CULTURES: Resp 4/2 >> normal flora Blood 4/2 >>  Urine 4/2 >> negative  ANTIBIOTICS: vanco 4/2 >> 4/3 Aztreonam 4/2 >> 4/3 levaquin 4/2 >> 4/5  SIGNIFICANT EVENTS:  LINES/TUBES: ETT 4/2 >>  Right Internal jugular CVC 4/2 >>  Foley catheter 4/2 >>  OG tube 4/2 >>   DISCUSSION: Randy Nunez is a 75 y.o. male with diabetes mellitus, hypertenstion, CAD s/p CABG, chronic atrial fibrillation and HFrEF who presented to the emergency department with shortness of breath 4/2 with subsequent PEA cardiac arrest and intubation. Clinical picture consistent with cardiogenic edema.    Intake/Output Summary (Last 24 hours) at 11/07/15 0949 Last data filed at 11/07/15 0900  Gross per 24 hour  Intake 2893.64 ml  Output   2655 ml  Net 238.64 ml    PULMONARY A: Acute hypoxemic respiratory failure Acute cardiogenic pulmonary edema Possible HCAP, doubt P:   PRVC 8cc/kg Wean PEEP and Fio2 as able > continue PEEP 10, wean FiO2 as able.  Do not believe he is in a position for SBT until more volume removal Treated w empiric abx, now off Diuresis as he can tolerate > increase lasix again on 4/6  CARDIOVASCULAR A:  Acute on chronic systolic congestive heart  failure exacerbation PEA arrest (likely respiratory etiology) Chronic atrial fibrillation (narrow complex) , tachycardic NSTEMI, ? Stress-related  Intermittent LBBB P:  Amiodarone gtt ASA, heparin gtt, statin,  Increase lasix 4/6 Cardiology following, no plan for L heart cath at this time. TTE deferred, ? Timing repeat  RENAL Lab Results  Component Value Date   CREATININE 1.53* 11/07/2015   CREATININE 1.24 11/06/2015   CREATININE 1.34* 11/05/2015  A:   Acute on chronic kidney failure Hypokalemia  P:   Replace electrolytes as indicated Follow BMP Lasix to 60 q12h 4/6, minimize IVF as able   GASTROINTESTINAL A:   SUP P:   PPI TF vital 1.2  HEMATOLOGIC A:   anticoag for A fib and ACS P:  Heparin gtt   INFECTIOUS A:  Possible HCAP P:   Empiric abx started 4/2 D/c'd levaquin 4/5, treated 4 days  ENDOCRINE CBG (last 3)   Recent Labs  11/06/15 2350 11/07/15 0400 11/07/15 0808  GLUCAP 268* 266* 266*  A:   DM P:   SSI per protocol  NEUROLOGIC A:   S/p Cardiac arrest Sedation for MV P:   RASS goal: -1 Given short duration, fact that he did not require ACLS, defer hypothermia Propofol + fentanyl prn.    FAMILY  - Updates: Updated his two sisters, two daughters and their spouses at bedside on 4/2. Will continue FULL CODE for now after discussion with family; but it is noted that he stated on 09/12/15 that he did not want to be placed on the mechanical ventilator (DNR/DNI) to Dr Ross Marcus.  4/6 no family at bedside.  Will work to set up family meeting for 4/7  - Inter-disciplinary family meet or Palliative Care meeting due by:  11/10/15  Independent CC time 35 minutes  Levy Pupa, MD, PhD 11/07/2015, 9:49 AM [facility] [phone] or if no answer [phone]

## 2015-11-07 NOTE — Progress Notes (Signed)
ANTICOAGULATION CONSULT NOTE  Pharmacy Consult for Heparin (while holding apixaban) Indication: chest pain/ACS and atrial fibrillation  Patient Measurements: Height: 5\' 11"  (180.3 cm) Weight: 207 lb 0.2 oz (93.9 kg) IBW/kg (Calculated) : 75.3  Vital Signs: Temp: 98.6 F (37 C) (04/06 1100) Temp Source: Axillary (04/06 1100) BP: 99/66 mmHg (04/06 1300) Pulse Rate: 104 (04/06 1300)  Labs:  Recent Labs  11/05/15 0514 11/05/15 0857 11/06/15 0420 11/07/15 0423 11/07/15 0424  HGB  --   --  11.4*  --   --   HCT  --   --  35.9*  --   --   PLT  --   --  196  --   --   APTT 111*  --  78* 78*  --   HEPARINUNFRC 1.32*  --  0.83*  --  0.59  CREATININE  --  1.34* 1.24 1.53*  --   TROPONINI  --  1.66*  --   --   --     Estimated Creatinine Clearance: 48 mL/min (by C-G formula based on Cr of 1.53).   Medical History: Past Medical History  Diagnosis Date  . Diabetes mellitus without complication (HCC)     x  8 yrs.  . Hypertension   . CAD (coronary artery disease)     a. s/p CABG 2001. b. NSTEMI 08/2013 - cath done, med rx recommended.  . Atrial fibrillation (HCC)     a. Dx 08/2013. Placed on eliquis. Plan DCCV as outpt. Also has had WCT c/w aberrancy.  . Chronic combined systolic and diastolic CHF (congestive heart failure) (HCC)   . Acute respiratory failure (HCC)     a. 08/2013 - VDRF due to pulm edema.   Assessment: 75 y/o M on apixaban PTA for afib, transfer from [facility] after brief cardiac arrest. He is on IV heparin for NSTEMI/afib (apixaban is on hold) and also noted with VDRF. --heparin level= 0.59, aPTT= 0.59  Goal of Therapy:  Heparin level 0.3-0.7 units/ml aPTT 66-102 seconds Monitor platelets by anticoagulation protocol: Yes   Plan:  -No heparin changes needed -Daily CBC/HL/aPTT -will consider changing to heparin levels only on 4/7  Harland German, Pharm D 11/07/2015 1:19 PM

## 2015-11-07 NOTE — Progress Notes (Signed)
11/07/2015 1545 Spoke with Pola Corn Secretary and message relayed for MD to review Diabetes Coordinator recommendations for patient.  Will await further orders if indicated.  Velvet Moomaw, Blanchard Kelch

## 2015-11-08 ENCOUNTER — Inpatient Hospital Stay (HOSPITAL_COMMUNITY): Payer: PPO

## 2015-11-08 LAB — CBC
HCT: 38.8 % — ABNORMAL LOW (ref 39.0–52.0)
Hemoglobin: 12.2 g/dL — ABNORMAL LOW (ref 13.0–17.0)
MCH: 29.3 pg (ref 26.0–34.0)
MCHC: 31.4 g/dL (ref 30.0–36.0)
MCV: 93.3 fL (ref 78.0–100.0)
PLATELETS: 226 10*3/uL (ref 150–400)
RBC: 4.16 MIL/uL — ABNORMAL LOW (ref 4.22–5.81)
RDW: 14.5 % (ref 11.5–15.5)
WBC: 9.8 10*3/uL (ref 4.0–10.5)

## 2015-11-08 LAB — GLUCOSE, CAPILLARY
GLUCOSE-CAPILLARY: 317 mg/dL — AB (ref 65–99)
GLUCOSE-CAPILLARY: 297 mg/dL — AB (ref 65–99)
Glucose-Capillary: 205 mg/dL — ABNORMAL HIGH (ref 65–99)
Glucose-Capillary: 275 mg/dL — ABNORMAL HIGH (ref 65–99)
Glucose-Capillary: 296 mg/dL — ABNORMAL HIGH (ref 65–99)
Glucose-Capillary: 314 mg/dL — ABNORMAL HIGH (ref 65–99)

## 2015-11-08 LAB — CULTURE, BLOOD (ROUTINE X 2)
CULTURE: NO GROWTH
CULTURE: NO GROWTH

## 2015-11-08 LAB — BASIC METABOLIC PANEL
Anion gap: 11 (ref 5–15)
BUN: 40 mg/dL — AB (ref 6–20)
CALCIUM: 9 mg/dL (ref 8.9–10.3)
CHLORIDE: 104 mmol/L (ref 101–111)
CO2: 28 mmol/L (ref 22–32)
CREATININE: 1.44 mg/dL — AB (ref 0.61–1.24)
GFR calc Af Amer: 53 mL/min — ABNORMAL LOW (ref >60)
GFR calc non Af Amer: 46 mL/min — ABNORMAL LOW (ref >60)
Glucose, Bld: 344 mg/dL — ABNORMAL HIGH (ref 65–99)
Potassium: 4.3 mmol/L (ref 3.5–5.1)
Sodium: 143 mmol/L (ref 135–145)

## 2015-11-08 LAB — PHOSPHORUS: Phosphorus: 4.7 mg/dL — ABNORMAL HIGH (ref 2.5–4.6)

## 2015-11-08 LAB — HEPARIN LEVEL (UNFRACTIONATED): Heparin Unfractionated: 0.57 IU/mL (ref 0.30–0.70)

## 2015-11-08 LAB — APTT: aPTT: 74 seconds — ABNORMAL HIGH (ref 24–37)

## 2015-11-08 LAB — MAGNESIUM: MAGNESIUM: 2.2 mg/dL (ref 1.7–2.4)

## 2015-11-08 MED ORDER — FUROSEMIDE 10 MG/ML IJ SOLN
60.0000 mg | Freq: Three times a day (TID) | INTRAMUSCULAR | Status: DC
  Administered 2015-11-08 – 2015-11-09 (×3): 60 mg via INTRAVENOUS
  Filled 2015-11-08 (×3): qty 6

## 2015-11-08 MED ORDER — FENTANYL CITRATE (PF) 100 MCG/2ML IJ SOLN
50.0000 ug | Freq: Once | INTRAMUSCULAR | Status: AC
  Administered 2015-11-08: 50 ug via INTRAVENOUS
  Filled 2015-11-08: qty 2

## 2015-11-08 MED ORDER — CHLORHEXIDINE GLUCONATE 0.12% ORAL RINSE (MEDLINE KIT)
15.0000 mL | Freq: Two times a day (BID) | OROMUCOSAL | Status: DC
  Administered 2015-11-08 – 2015-11-13 (×10): 15 mL via OROMUCOSAL

## 2015-11-08 MED ORDER — SODIUM CHLORIDE 0.9 % IV SOLN
25.0000 ug/h | INTRAVENOUS | Status: DC
  Administered 2015-11-08: 225 ug/h via INTRAVENOUS
  Administered 2015-11-08: 25 ug/h via INTRAVENOUS
  Administered 2015-11-09: 125 ug/h via INTRAVENOUS
  Administered 2015-11-09: 30 ug/h via INTRAVENOUS
  Administered 2015-11-11: 70 ug/h via INTRAVENOUS
  Administered 2015-11-12: 75 ug/h via INTRAVENOUS
  Administered 2015-11-12: 100 ug/h via INTRAVENOUS
  Filled 2015-11-08 (×7): qty 50

## 2015-11-08 MED ORDER — FENTANYL BOLUS VIA INFUSION
25.0000 ug | INTRAVENOUS | Status: DC | PRN
  Administered 2015-11-09: 25 ug via INTRAVENOUS
  Filled 2015-11-08: qty 25

## 2015-11-08 MED ORDER — ANTISEPTIC ORAL RINSE SOLUTION (CORINZ)
7.0000 mL | OROMUCOSAL | Status: DC
  Administered 2015-11-08 – 2015-11-13 (×51): 7 mL via OROMUCOSAL

## 2015-11-08 NOTE — Progress Notes (Signed)
Changes made per MD request.

## 2015-11-08 NOTE — Consult Note (Signed)
   [facility] CM Inpatient Consult   11/08/2015  Randy Nunez 10/24/1940 [id number] Patient was active with [facility] Care Management for chronic disease management services prior to admission.  Patient has been engaged by a [employer] and CSW.  Patient is currently intubated, will follow progress for appropriate needs.   Of note, [facility] Care Management services does not replace or interfere with any services that are needed or arranged by inpatient case management or social work.  For additional questions or referrals please contact: Charlesetta Shanks, RN BSN CCM [facility]  [phone] business mobile phone Toll free office [phone]

## 2015-11-08 NOTE — Progress Notes (Signed)
PULMONARY / CRITICAL CARE MEDICINE   Name: JAMAREON SHIMEL MRN: [id number] DOB: Sep 24, 1940    ADMISSION DATE:  11/22/2015  CHIEF COMPLAINT:  Respiratory failure and cardiac arrest  HISTORY OF PRESENT ILLNESS:   ELLWOOD STEIDLE is a 75 y.o. male with diabetes mellitus, hypertenstion, CAD s/p CABG, chronic atrial fibrillation and HFrEF who presented to the emergency department with shortness of breath earlier today. History is obtained by reports and chart review as his family who is present in our ICU was not with him during these events.  Mr. Hinch reported severe shortness of breath after eating this afternoon with the feeling of being "full" after being normal this morning. He eventually started sweating and asked his neighbor in his assisted care living center to take him to the ED for breathing difficulties. He was conversant when he arrived in the ED, but after transfer to the ED room, he was noted to be in extremis, ashen colored and lost his pulse. CPR was initiated for PEA cardiac arrest and the patient had ROSC after intubation without the need for ACLS medications or shocks. Immediately after intubation he was noted to have an irregular wide complex tachycardia and was given a bolus of amiodarone followed by an infusion. It was noted he had pink frothy sputum in his ETT. He was then transferred to [facility] ICU.    SUBJECTIVE:  Fio2 0.60, PEEP 10, SpO2 100% Sedated No clinical changes reported He is still net positive last 24 hours  VITAL SIGNS: BP 94/57 mmHg  Pulse 106  Temp(Src) 98.4 F (36.9 C) (Axillary)  Resp 16  Ht '5\' 11"'$  (1.803 m)  Wt 93.6 kg (206 lb 5.6 oz)  BMI 28.79 kg/m2  SpO2 100%  HEMODYNAMICS:    VENTILATOR SETTINGS: Vent Mode:  [-] PRVC FiO2 (%):  [60 %] 60 % Set Rate:  [16 bmp] 16 bmp Vt Set:  [550 mL] 550 mL PEEP:  [10 cmH20] 10 cmH20 Plateau Pressure:  [21 cmH20-22 cmH20] 22 cmH20  INTAKE / OUTPUT: I/O last 3 completed shifts: In: 4264.2  [I.V.:1999.2; NG/GT:2265] Out: 3490 [IRCVE:9381]  PHYSICAL EXAMINATION: General: Sedated, moving extremities spontaneously, not interactive, intubated Neuro: Low tone in all extremities, pupils ~18m bilaterally and reactive HEENT: Moist mucous membranes, ETT in place Cardiovascular: Irregularly irregular, no murmurs, previous sternotomy scar noted, afib 90's Lungs: Crackles present bilaterally, particularly in the bases Abdomen: Obese, soft, nontender, no appreciable hepatosplenomegaly Musculoskeletal: Warm extremities, 2+ pitting edema in legs Skin: Hyperpigmented skin in feet  LABS:  BMET  Recent Labs Lab 11/06/15 0420 11/07/15 0423 11/08/15 0449  NA 141 141 143  K 4.1 3.9 4.3  CL 105 104 104  CO2 '26 26 28  '$ BUN 21* 32* 40*  CREATININE 1.24 1.53* 1.44*  GLUCOSE 237* 276* 344*    Electrolytes  Recent Labs Lab 11/06/15 0420 11/07/15 0423 11/08/15 0449  CALCIUM 8.5* 8.5* 9.0  MG 1.9 2.0 2.2  PHOS 3.0  --  4.7*    CBC  Recent Labs Lab 11/04/15 0340 11/06/15 0420 11/08/15 0449  WBC 17.0* 8.8 9.8  HGB 13.8 11.4* 12.2*  HCT 40.9 35.9* 38.8*  PLT 212 196 226    Coag's  Recent Labs Lab 11/30/2015 2330  11/06/15 0420 11/07/15 0423 11/08/15 0449  APTT 33  < > 78* 78* 74*  INR 1.22  --   --   --   --   < > = values in this interval not displayed.  Sepsis Markers  Recent  Labs Lab 11/17/2015 1630 11/30/2015 2030 11/04/15 0340  LATICACIDVEN 8.40* 2.6* 1.9    ABG  Recent Labs Lab 12/01/2015 1620 11/11/2015 2038  PHART 7.221* 7.360  PCO2ART 50.6* 46.1*  PO2ART 116* 77.0*    Liver Enzymes  Recent Labs Lab 11/19/2015 1545 11/04/15 0340  AST 140* 94*  ALT 80* 70*  ALKPHOS 117 76  BILITOT 0.8 0.9  ALBUMIN 4.1 3.0*    Cardiac Enzymes  Recent Labs Lab 11/24/2015 2225 11/04/15 0340 11/05/15 0857  TROPONINI 1.62* 3.13* 1.66*    Glucose  Recent Labs Lab 11/07/15 0808 11/07/15 1137 11/07/15 1608 11/07/15 1929 11/07/15 2356  11/08/15 0403  GLUCAP 266* 269* 266* 274* 275* 314*    Imaging Dg Chest Port 1 View  11/08/2015  CLINICAL DATA:  Acute respiratory failure EXAM: PORTABLE CHEST 1 VIEW COMPARISON:  11/07/2015; 11/05/2015; 09/12/2015; 08/29/2015 FINDINGS: Grossly unchanged enlarged cardiac silhouette and mediastinal contours post median sternotomy and CABG. Stable position of support apparatus. Pulmonary vasculature remains indistinct with cephalization of flow. Bilateral mid and lower lung heterogeneous opacities, right greater than left, are grossly unchanged. No new focal airspace opacities. Unchanged suspected trace bilateral effusions. No pneumothorax. Unchanged bones. IMPRESSION: 1.  Stable positioning of support apparatus.  No pneumothorax. 2. Similar findings of pulmonary edema and bibasilar opacities, right greater than left, atelectasis versus infiltrate. Electronically Signed   By: Sandi Mariscal M.D.   On: 11/08/2015 07:55     STUDIES:   CULTURES: Resp 4/2 >> normal flora Blood 4/2 >>  Urine 4/2 >> negative  ANTIBIOTICS: vanco 4/2 >> 4/3 Aztreonam 4/2 >> 4/3 levaquin 4/2 >> 4/5  SIGNIFICANT EVENTS:  LINES/TUBES: ETT 4/2 >>  Right Internal jugular CVC 4/2 >>  Foley catheter 4/2 >>  OG tube 4/2 >>   DISCUSSION: TALIS IWAN is a 75 y.o. male with diabetes mellitus, hypertenstion, CAD s/p CABG, chronic atrial fibrillation and HFrEF who presented to the emergency department with shortness of breath 4/2 with subsequent PEA cardiac arrest and intubation. Clinical picture consistent with cardiogenic edema.    Intake/Output Summary (Last 24 hours) at 11/08/15 0855 Last data filed at 11/08/15 0800  Gross per 24 hour  Intake 2993.3 ml  Output   2760 ml  Net  233.3 ml    PULMONARY A: Acute hypoxemic respiratory failure Acute cardiogenic pulmonary edema Possible HCAP, doubt P:   PRVC 8cc/kg Wean PEEP and Fio2 as able > PEEP to 8, wean FiO2 as able.  Do not believe he is in a position for  SBT until more volume removal Treated w empiric abx, now off Diuresis as he can tolerate > increase lasix again on 4/7  CARDIOVASCULAR A:  Acute on chronic systolic congestive heart failure exacerbation PEA arrest (likely respiratory etiology) Chronic atrial fibrillation (narrow complex) , tachycardic NSTEMI, ? Stress-related  Intermittent LBBB P:  Amiodarone gtt ASA, heparin gtt, statin,  Increase lasix 4/7 Cardiology following, no plan for L heart cath at this time. TTE deferred, ? Timing repeat  RENAL Lab Results  Component Value Date   CREATININE 1.44* 11/08/2015   CREATININE 1.53* 11/07/2015   CREATININE 1.24 11/06/2015  A:   Acute on chronic kidney failure Hypokalemia  P:   Replace electrolytes as indicated Follow BMP Lasix to 60 q8h 4/7, minimize IVF as able   GASTROINTESTINAL A:   SUP P:   PPI TF vital 1.2  HEMATOLOGIC A:   anticoag for A fib and ACS P:  Heparin gtt  INFECTIOUS A:  Possible HCAP P:   Empiric abx started 4/2 D/c'd levaquin 4/5, treated 4 days  ENDOCRINE CBG (last 3)   Recent Labs  11/07/15 1929 11/07/15 2356 11/08/15 0403  GLUCAP 274* 275* 314*  A:   DM P:   SSI per protocol  NEUROLOGIC A:   S/p Cardiac arrest Sedation for MV P:   RASS goal: -1 Given short duration, fact that he did not require ACLS, deferred hypothermia On propofol, will try to convert to quad-strength fentanyl gtt with versed prn pushes.     FAMILY  - Updates: Updated his two sisters, two daughters and their spouses at bedside on 4/2. Will continue FULL CODE for now after discussion with family; but it is noted that he stated on 09/12/15 that he did not want to be placed on the mechanical ventilator (DNR/DNI) to Dr Thayer Jew.    - Inter-disciplinary family meeting performed with Shelda Pal 11/08/15 >> Dr Lamonte Sakai met with 2 sisters and his niece, reviewed pt's underlying problems, his current status and his prognosis. They understand that he  has been stable for last few days but not in a place where he can be extubated. We agreed to continue medical management, efforts at diuresis through the weekend. If progress made then will discuss potentially extending his care further with goal for extubation. If no progress through the weekend then we will discuss possible withdrawal of MV and transition to palliation. In the meantime we did agree that he would not benefit from CPR should he arrest or become unstable. I will place DNR orders on the chart. His sisters will update his daughters about his status and these plans. We will include them in any decision making regarding withdrawal vs continued support.   Independent CC time 64 minutes  Baltazar Apo, MD, PhD 11/08/2015, 8:55 AM [facility] [phone] or if no answer [phone]

## 2015-11-08 NOTE — Progress Notes (Signed)
Inpatient Diabetes Program Recommendations  AACE/ADA: New Consensus Statement on Inpatient Glycemic Control (2015)  Target Ranges:  Prepandial:   less than 140 mg/dL      Peak postprandial:   less than 180 mg/dL (1-2 hours)      Critically ill patients:  140 - 180 mg/dL   Review of Glycemic Control Results for WELLES, SELIG (MRN [id number]) as of 11/08/2015 07:39  Ref. Range 11/07/2015 08:08 11/07/2015 11:37 11/07/2015 16:08 11/07/2015 19:29 11/07/2015 23:56 11/08/2015 04:03  Glucose-Capillary Latest Ref Range: 65-99 mg/dL 650 (H) 354 (H) 656 (H) 274 (H) 275 (H) 314 (H)    Inpatient Diabetes Program Recommendations:  Please review patient received Novolog 51 units correction over the past 24 hrs. Please consider Increase of Levemir to 20 units q hs + tube feeding coverage 4 units Novolog q 4 hrs.  Thank you, Billy Fischer. Terrace Chiem, RN, MSN, CDE Inpatient Glycemic Control Team Team Pager [phone] (8am-5pm) 11/08/2015 7:58 AM

## 2015-11-08 NOTE — Progress Notes (Signed)
ANTICOAGULATION CONSULT NOTE  Pharmacy Consult for Heparin (while holding apixaban) Indication: chest pain/ACS and atrial fibrillation  Patient Measurements: Height: 5\' 11"  (180.3 cm) Weight: 206 lb 5.6 oz (93.6 kg) IBW/kg (Calculated) : 75.3  Vital Signs: Temp: 98.4 F (36.9 C) (04/07 0825) Temp Source: Axillary (04/07 0825) BP: 86/64 mmHg (04/07 1100) Pulse Rate: 99 (04/07 1100)  Labs:  Recent Labs  11/06/15 0420 11/07/15 0423 11/07/15 0424 11/08/15 0449  HGB 11.4*  --   --  12.2*  HCT 35.9*  --   --  38.8*  PLT 196  --   --  226  APTT 78* 78*  --  74*  HEPARINUNFRC 0.83*  --  0.59 0.57  CREATININE 1.24 1.53*  --  1.44*    Estimated Creatinine Clearance: 51 mL/min (by C-G formula based on Cr of 1.44).   Medical History: Past Medical History  Diagnosis Date  . Diabetes mellitus without complication (HCC)     x  8 yrs.  . Hypertension   . CAD (coronary artery disease)     a. s/p CABG 2001. b. NSTEMI 08/2013 - cath done, med rx recommended.  . Atrial fibrillation (HCC)     a. Dx 08/2013. Placed on eliquis. Plan DCCV as outpt. Also has had WCT c/w aberrancy.  . Chronic combined systolic and diastolic CHF (congestive heart failure) (HCC)   . Acute respiratory failure (HCC)     a. 08/2013 - VDRF due to pulm edema.   Assessment: 75 y/o M on apixaban PTA for afib, transfer from [facility] after brief cardiac arrest. He is on IV heparin for NSTEMI/afib (apixaban is on hold) and also noted with VDRF. --heparin level= 0.57, aPTT= 74, hg= 12.2, plt= 226  Goal of Therapy:  Heparin level 0.3-0.7 units/ml aPTT 66-102 seconds Monitor platelets by anticoagulation protocol: Yes   Plan:  -No heparin changes needed -Daily CBC/HL/aPTT -d/c daily aPTTs  Harland German, Pharm D 11/08/2015 11:10 AM

## 2015-11-08 NOTE — Progress Notes (Signed)
Pt transported on vent from 2S12 to 2H02 on vent at 100% FIO2. Vitals remained stable throughout.

## 2015-11-09 ENCOUNTER — Inpatient Hospital Stay (HOSPITAL_COMMUNITY): Payer: PPO

## 2015-11-09 DIAGNOSIS — I482 Chronic atrial fibrillation: Secondary | ICD-10-CM

## 2015-11-09 DIAGNOSIS — L8990 Pressure ulcer of unspecified site, unspecified stage: Secondary | ICD-10-CM | POA: Insufficient documentation

## 2015-11-09 LAB — BASIC METABOLIC PANEL
Anion gap: 12 (ref 5–15)
BUN: 47 mg/dL — ABNORMAL HIGH (ref 6–20)
CALCIUM: 8.7 mg/dL — AB (ref 8.9–10.3)
CHLORIDE: 105 mmol/L (ref 101–111)
CO2: 29 mmol/L (ref 22–32)
CREATININE: 1.35 mg/dL — AB (ref 0.61–1.24)
GFR calc non Af Amer: 49 mL/min — ABNORMAL LOW (ref >60)
GFR, EST AFRICAN AMERICAN: 57 mL/min — AB (ref >60)
GLUCOSE: 330 mg/dL — AB (ref 65–99)
Potassium: 3.8 mmol/L (ref 3.5–5.1)
Sodium: 146 mmol/L — ABNORMAL HIGH (ref 135–145)

## 2015-11-09 LAB — GLUCOSE, CAPILLARY
GLUCOSE-CAPILLARY: 217 mg/dL — AB (ref 65–99)
GLUCOSE-CAPILLARY: 247 mg/dL — AB (ref 65–99)
GLUCOSE-CAPILLARY: 294 mg/dL — AB (ref 65–99)
GLUCOSE-CAPILLARY: 307 mg/dL — AB (ref 65–99)
Glucose-Capillary: 234 mg/dL — ABNORMAL HIGH (ref 65–99)
Glucose-Capillary: 290 mg/dL — ABNORMAL HIGH (ref 65–99)
Glucose-Capillary: 306 mg/dL — ABNORMAL HIGH (ref 65–99)

## 2015-11-09 LAB — MAGNESIUM: MAGNESIUM: 2.1 mg/dL (ref 1.7–2.4)

## 2015-11-09 LAB — HEPARIN LEVEL (UNFRACTIONATED)
HEPARIN UNFRACTIONATED: 0.31 [IU]/mL (ref 0.30–0.70)
Heparin Unfractionated: 0.35 IU/mL (ref 0.30–0.70)

## 2015-11-09 LAB — CBC
HCT: 39.5 % (ref 39.0–52.0)
HEMOGLOBIN: 12.3 g/dL — AB (ref 13.0–17.0)
MCH: 29.5 pg (ref 26.0–34.0)
MCHC: 31.1 g/dL (ref 30.0–36.0)
MCV: 94.7 fL (ref 78.0–100.0)
Platelets: 210 10*3/uL (ref 150–400)
RBC: 4.17 MIL/uL — ABNORMAL LOW (ref 4.22–5.81)
RDW: 14.5 % (ref 11.5–15.5)
WBC: 11.3 10*3/uL — ABNORMAL HIGH (ref 4.0–10.5)

## 2015-11-09 MED ORDER — INSULIN GLARGINE 100 UNIT/ML ~~LOC~~ SOLN
20.0000 [IU] | Freq: Every day | SUBCUTANEOUS | Status: DC
  Administered 2015-11-09: 20 [IU] via SUBCUTANEOUS
  Filled 2015-11-09 (×2): qty 0.2

## 2015-11-09 MED ORDER — SODIUM CHLORIDE 0.9% FLUSH: 10.0000 mL | INTRAVENOUS | Status: DC | PRN

## 2015-11-09 MED ORDER — DEXMEDETOMIDINE HCL IN NACL 200 MCG/50ML IV SOLN
0.4000 ug/kg/h | INTRAVENOUS | Status: DC
  Administered 2015-11-09: 0.4 ug/kg/h via INTRAVENOUS
  Administered 2015-11-09: 0.5 ug/kg/h via INTRAVENOUS
  Administered 2015-11-09: 0.4 ug/kg/h via INTRAVENOUS
  Administered 2015-11-09 – 2015-11-10 (×2): 0.5 ug/kg/h via INTRAVENOUS
  Administered 2015-11-10: 0.6 ug/kg/h via INTRAVENOUS
  Administered 2015-11-10 (×2): 0.5 ug/kg/h via INTRAVENOUS
  Administered 2015-11-11 (×2): 0.4 ug/kg/h via INTRAVENOUS
  Administered 2015-11-11: 0.3 ug/kg/h via INTRAVENOUS
  Administered 2015-11-12 – 2015-11-13 (×5): 0.4 ug/kg/h via INTRAVENOUS
  Filled 2015-11-09 (×19): qty 50

## 2015-11-09 MED ORDER — MIDAZOLAM HCL 2 MG/2ML IJ SOLN
2.0000 mg | Freq: Once | INTRAMUSCULAR | Status: AC
  Administered 2015-11-09: 2 mg via INTRAVENOUS
  Filled 2015-11-09: qty 2

## 2015-11-09 MED ORDER — SODIUM CHLORIDE 0.9% FLUSH
10.0000 mL | Freq: Two times a day (BID) | INTRAVENOUS | Status: DC
  Administered 2015-11-09 (×2): 10 mL
  Administered 2015-11-10: 20 mL
  Administered 2015-11-11 – 2015-11-12 (×4): 10 mL

## 2015-11-09 MED ORDER — FUROSEMIDE 10 MG/ML IJ SOLN
60.0000 mg | Freq: Two times a day (BID) | INTRAMUSCULAR | Status: DC
  Administered 2015-11-09 – 2015-11-10 (×2): 60 mg via INTRAVENOUS
  Filled 2015-11-09 (×2): qty 6

## 2015-11-09 MED ORDER — METOPROLOL TARTRATE 25 MG/10 ML ORAL SUSPENSION
25.0000 mg | Freq: Two times a day (BID) | ORAL | Status: DC
  Administered 2015-11-09 – 2015-11-12 (×5): 25 mg
  Filled 2015-11-09 (×6): qty 10

## 2015-11-09 NOTE — Progress Notes (Addendum)
Patient Name: Randy Nunez Date of Encounter: 11/09/2015  Active Problems:   Essential hypertension, benign   Acute on chronic combined systolic and diastolic CHF (congestive heart failure) ([place])   Atrial fibrillation ([place])   Cardiac arrest ([place])   Acute congestive heart failure ([place])   Respiratory arrest ([place])   Length of Stay: 6  SUBJECTIVE  75 yo admitted with respiratory failure and cardiac ( PEA ) arrest.   The patient is intubated and sedated.   CURRENT MEDS . antiseptic oral rinse  7 mL Mouth Rinse 10 times per day  . aspirin  81 mg Per Tube Daily  . chlorhexidine gluconate (SAGE KIT)  15 mL Mouth Rinse BID  . famotidine  20 mg Oral BID  . feeding supplement (PRO-STAT SUGAR FREE 64)  30 mL Per Tube TID  . furosemide  60 mg Intravenous 3 times per day  . insulin aspart  0-15 Units Subcutaneous 6 times per day  . insulin glargine  15 Units Subcutaneous QHS  . metoprolol succinate  50 mg Oral Q0600  . potassium chloride  40 mEq Per Tube Daily  . pravastatin  40 mg Oral q1800   . amiodarone 30 mg/hr (11/08/15 2153)  . diltiazem (CARDIZEM) infusion Stopped (11/05/15 1200)  . feeding supplement (VITAL AF 1.2 CAL) 1,000 mL (11/09/15 0422)  . fentaNYL infusion INTRAVENOUS 250 mcg/hr (11/09/15 0425)  . heparin 1,100 Units/hr (11/09/15 0431)  . insulin (NOVOLIN-R) infusion Stopped (11/05/15 0700)  . propofol (DIPRIVAN) infusion Stopped (11/08/15 1205)   OBJECTIVE  Filed Vitals:   11/09/15 0400 11/09/15 0500 11/09/15 0600 11/09/15 0730  BP: 113/77 93/60 106/48 97/64  Pulse: 74 74 75 73  Temp: 98.6 F (37 C)     TempSrc: Oral     Resp: _0 Height:      Weight: 204 lb 9.4 oz (92.8 kg)     SpO2: 93% 100% 100% 100%    Intake/Output Summary (Last 24 hours) at 11/09/15 0750 Last data filed at 11/09/15 0600  Gross per 24 hour  Intake 2798.99 ml  Output   4220 ml  Net -1421.01 ml   Filed Weights   11/07/15 0500 11/08/15 0500 11/09/15 0400  Weight: 207  lb 0.2 oz (93.9 kg) 206 lb 5.6 oz (93.6 kg) 204 lb 9.4 oz (92.8 kg)   PHYSICAL EXAM  General: intubated sedated Psych: Normal affect. HEENT:  Normal  Neck: Supple without bruits or JVD. Lungs:  Resp regular and unlabored, some rales at bases. Heart: RRR no s3, s4, or murmurs. Abdomen: Soft, non-tender, non-distended, BS + x 4.  Extremities: No clubbing, cyanosis or edema. DP/PT/Radials 2+ and equal bilaterally.  Accessory Clinical Findings  CBC  Recent Labs  11/08/15 0449 11/09/15 0409  WBC 9.8 11.3*  HGB 12.2* 12.3*  HCT 38.8* 39.5  MCV 93.3 94.7  PLT 226 700   Basic Metabolic Panel  Recent Labs  11/08/15 0449 11/09/15 0409  NA 143 146*  K 4.3 3.8  CL 104 105  CO2 28 29  GLUCOSE 344* 330*  BUN 40* 47*  CREATININE 1.44* 1.35*  CALCIUM 9.0 8.7*  MG 2.2 2.1  PHOS 4.7*  --    Liver Function Tests No results for input(s): AST, ALT, ALKPHOS, BILITOT, PROT, ALBUMIN in the last 72 hours. No results for input(s): CKTOTAL, CKMB, CKMBINDEX, TROPONINI in the last 72 hours.  Recent Labs  11/06/15 2103  TRIG 236*   Dg Chest [facility]  11/09/2015  CLINICAL DATA:  75 year old male with a history of acute respiratory failure. EXAM: PORTABLE CHEST 1 VIEW COMPARISON:  11/08/2015, 11/07/2015 FINDINGS: Cardiomediastinal silhouette unchanged in size and contour. Surgical changes of median sternotomy and CABG. Endotracheal tube appears to terminate approximately 5.4 cm above the carina. Gastric tube terminates out of the field of view. Right IJ approach central venous catheter appears to terminate in the superior vena cava. Similar appearance of mixed interstitial and airspace opacities bilateral lungs. No pneumothorax. No displaced fracture. IMPRESSION: Similar appearance of the chest x-ray with persisting mixed interstitial and airspace opacity, potentially a combination of atelectasis, edema, and/ or consolidation. No large pleural effusion. Endotracheal tube has been slightly  withdrawn, and terminates suitably above the carina, measuring approximately 5.5 cm. Otherwise unchanged support apparatus. Signed, Dulcy Fanny. Earleen Newport, DO [facility] Electronically Signed   By: Corrie Mckusick D.O.   On: 11/09/2015 07:08   Dg Chest Port 1 View  11/08/2015  CLINICAL DATA:  Acute respiratory failure EXAM: PORTABLE CHEST 1 VIEW COMPARISON:  11/07/2015; 11/05/2015; 09/12/2015; 08/29/2015 FINDINGS: Grossly unchanged enlarged cardiac silhouette and mediastinal contours post median sternotomy and CABG. Stable position of support apparatus. Pulmonary vasculature remains indistinct with cephalization of flow. Bilateral mid and lower lung heterogeneous opacities, right greater than left, are grossly unchanged. No new focal airspace opacities. Unchanged suspected trace bilateral effusions. No pneumothorax. Unchanged bones. IMPRESSION: 1.  Stable positioning of support apparatus.  No pneumothorax. 2. Similar findings of pulmonary edema and bibasilar opacities, right greater than left, atelectasis versus infiltrate. Electronically Signed   By: Sandi Mariscal M.D.   On: 11/08/2015 07:55   Dg Chest Port 1 View  11/07/2015  CLINICAL DATA:  Ventilator dependent respiratory failure EXAM: PORTABLE CHEST 1 VIEW COMPARISON:  Portable chest x-ray of 11/05/2015 and 11/20/2015 FINDINGS: There is little change in probable interstitial edema pattern with small effusions and cardiomegaly. The tip of the endotracheal tube is only 1.4 cm above the carina. Right IJ central venous line overlies the lower SVC and and NG tube coils in the stomach. Mild cardiomegaly is stable. IMPRESSION: 1. Little change in interstitial edema pattern. 2. Tip of endotracheal tube only 1.4 cm above the carina. Electronically Signed   By: Ivar Drape M.D.   On: 11/07/2015 08:13   Dg Chest Port 1 View  11/05/2015  CLINICAL DATA:  Respiratory failure. EXAM: PORTABLE CHEST 1 VIEW COMPARISON:   11/22/2015 FINDINGS: Endotracheal tube, right central line and NG tube remain in place, unchanged. Prior CABG. Heart is mildly enlarged with vascular congestion. Interstitial prominence may reflect interstitial edema, improving since prior study. No confluent opacities or effusions. IMPRESSION: Improving interstitial edema pattern. Support devices are stable. Electronically Signed   By: Rolm Baptise M.D.   On: 11/05/2015 11:06   Dg Chest Portable 1 View  11/20/2015  CLINICAL DATA:  Acute respiratory failure.  Central line placement. EXAM: PORTABLE CHEST 1 VIEW COMPARISON:  11/21/2015 FINDINGS: The endotracheal tube and NG tubes are stable. New right IJ central venous catheter tip is in the distal SVC near the cavoatrial junction. No complicating features. Persistent cardiac enlargement and diffuse pulmonary edema. IMPRESSION: Right IJ center venous catheter tip is in the distal SVC. The endotracheal tube and NG tubes are stable. Persistent pulmonary edema. Electronically Signed   By: Marijo Sanes M.D.   On: 11/17/2015 17:39   Dg Chest Portable 1 View  12/01/2015  CLINICAL DATA:  Sudden shortness of breath, diaphoretic. EXAM:  PORTABLE CHEST 1 VIEW COMPARISON:  Chest x-rays dated 09/12/2015 and 08/22/2013. FINDINGS: Cardiomegaly is stable. Median sternotomy wires are stable in alignment. Endotracheal tube appears well positioned with tip just above the level of the carina. Enteric tube passes below the diaphragm. There is central pulmonary vascular congestion and bilateral interstitial edema suggesting volume overload/ CHF. No pleural effusion or pneumothorax seen. IMPRESSION: 1. Cardiomegaly with central pulmonary vascular congestion and bilateral interstitial edema indicating CHF/volume overload. 2. Endotracheal tube well positioned with tip approximately 2 cm above the level of the carina. Electronically Signed   By: Franki Cabot M.D.   On: 11/26/2015 15:57   TELE: a-fib with RVR   ASSESSMENT AND  PLAN  # NSTEMI - Troponin elevation in the setting of respiratory failure - The patient required intubation on 4/2 sec to respiratory failure, he is intubated and sedated, follows commends, denies chest pain.   # PEA Arrest - May have been due to above versus primary respiratory etiology, such as pulmonary edema or pneumonia.  - We accordingly agree with diuretic & antimicrobial efforts.  - Though there is not overt evidence of VT,  Continue  Amiodarone for now.    Will probably DC tomorrow    # h/o Chronic Afib - We have transitioned him from Apixaban to Heparin in the setting of ACS.  - continue iv amiodarone for now.   # h/o HFrEF -  . - start lasix 40 mg iv BID, Crea improving 1.24  # h/o HTN - BP is actually low.   Will change Toprol to Metoprolol suspension    Damia Bobrowski, Wonda Cheng, MD  11/09/2015 7:50 AM    [place],  [place], [place]  [address] Pager [phone] Phone: [phone]; Fax: [phone]   [facility]  [address] [place], [place]  [address] [phone]   Fax [phone]

## 2015-11-09 NOTE — Progress Notes (Signed)
PULMONARY / CRITICAL CARE MEDICINE   Name: Randy Nunez MRN: [id number] DOB: September 06, 1940    ADMISSION DATE:  11/22/2015  CHIEF COMPLAINT:  Respiratory failure and cardiac arrest  HISTORY OF PRESENT ILLNESS:   Randy Nunez is a 75 y.o. male with diabetes mellitus, hypertenstion, CAD s/p CABG, chronic atrial fibrillation and HFrEF who presented to outside emergency department with shortness of breath 4/2.  Had subsequent brief PEA arrest with ROSC after intubation.  He was then transferred to [facility] ICU.  Picture c/w cardiogenic pulmonary edema.    SUBJECTIVE:  Vent needs improving.  On 50% FiO2.  Agitated.  Starting to achieve net NEG.    VITAL SIGNS: BP 127/57 mmHg  Pulse 112  Temp(Src) 98 F (36.7 C) (Oral)  Resp 17  Ht _0  (1.803 m)  Wt 92.8 kg (204 lb 9.4 oz)  BMI 28.55 kg/m2  SpO2 95%  HEMODYNAMICS:    VENTILATOR SETTINGS: Vent Mode:  [-] PRVC FiO2 (%):  [40 %-60 %] 50 % Set Rate:  [16 bmp] 16 bmp Vt Set:  [550 mL] 550 mL PEEP:  [8 cmH20] 8 cmH20 Plateau Pressure:  [18 cmH20-27 cmH20] 22 cmH20  INTAKE / OUTPUT: I/O last 3 completed shifts: In: 4251.9 [I.V.:2016.9; NG/GT:2235] Out: 5640 [Urine:5640]  PHYSICAL EXAMINATION: General: Agitated,  Neuro: agitated, follows some simple commands intermittently, MAE, RASS +1  HEENT: Moist mucous membranes, ETT in place Cardiovascular: Irregularly irregular, no murmurs, previous sternotomy scar noted, afib 90's Lungs: resps even, non labored on vent, mild tachypnea, faint bibasilar crackles  Abdomen: Obese, soft, nontender, no appreciable hepatosplenomegaly Musculoskeletal: Warm extremities, 2+ pitting edema in legs Skin: Hyperpigmented skin in feet  LABS:  BMET  Recent Labs Lab 11/07/15 0423 11/08/15 0449 11/09/15 0409  NA 141 143 146*  K 3.9 4.3 3.8  CL 104 104 105  CO2 _1 BUN 32* 40* 47*  CREATININE 1.53* 1.44* 1.35*  GLUCOSE 276* 344* 330*    Electrolytes  Recent Labs Lab  11/06/15 0420 11/07/15 0423 11/08/15 0449 11/09/15 0409  CALCIUM 8.5* 8.5* 9.0 8.7*  MG 1.9 2.0 2.2 2.1  PHOS 3.0  --  4.7*  --     CBC  Recent Labs Lab 11/06/15 0420 11/08/15 0449 11/09/15 0409  WBC 8.8 9.8 11.3*  HGB 11.4* 12.2* 12.3*  HCT 35.9* 38.8* 39.5  PLT 196 226 210    Coag's  Recent Labs Lab 11/05/2015 2330  11/06/15 0420 11/07/15 0423 11/08/15 0449  APTT 33  < > 78* 78* 74*  INR 1.22  --   --   --   --   < > = values in this interval not displayed.  Sepsis Markers  Recent Labs Lab 11/28/2015 1630 11/24/2015 2030 11/04/15 0340  LATICACIDVEN 8.40* 2.6* 1.9    ABG  Recent Labs Lab 11/04/2015 1620 11/10/2015 2038  PHART 7.221* 7.360  PCO2ART 50.6* 46.1*  PO2ART 116* 77.0*    Liver Enzymes  Recent Labs Lab 11/02/2015 1545 11/04/15 0340  AST 140* 94*  ALT 80* 70*  ALKPHOS 117 76  BILITOT 0.8 0.9  ALBUMIN 4.1 3.0*    Cardiac Enzymes  Recent Labs Lab 11/08/2015 2225 11/04/15 0340 11/05/15 0857  TROPONINI 1.62* 3.13* 1.66*    Glucose  Recent Labs Lab 11/08/15 1215 11/08/15 1619 11/08/15 2017 11/09/15 0003 11/09/15 0402 11/09/15 0845  GLUCAP 296* 297* 205* 217* 294* 307*    Imaging Dg Chest Port 1 View  11/09/2015  CLINICAL DATA:  75 year old  male with a history of acute respiratory failure. EXAM: PORTABLE CHEST 1 VIEW COMPARISON:  11/08/2015, 11/07/2015 FINDINGS: Cardiomediastinal silhouette unchanged in size and contour. Surgical changes of median sternotomy and CABG. Endotracheal tube appears to terminate approximately 5.4 cm above the carina. Gastric tube terminates out of the field of view. Right IJ approach central venous catheter appears to terminate in the superior vena cava. Similar appearance of mixed interstitial and airspace opacities bilateral lungs. No pneumothorax. No displaced fracture. IMPRESSION: Similar appearance of the chest x-ray with persisting mixed interstitial and airspace opacity, potentially a combination of  atelectasis, edema, and/ or consolidation. No large pleural effusion. Endotracheal tube has been slightly withdrawn, and terminates suitably above the carina, measuring approximately 5.5 cm. Otherwise unchanged support apparatus. Signed, Dulcy Fanny. Earleen Newport, DO [facility] Radiology Electronically Signed   By: Corrie Mckusick D.O.   On: 11/09/2015 07:08     STUDIES:   CULTURES: Resp 4/2 >> normal flora Blood 4/2 >>  Urine 4/2 >> negative  ANTIBIOTICS: vanco 4/2 >> 4/3 Aztreonam 4/2 >> 4/3 levaquin 4/2 >> 4/5  SIGNIFICANT EVENTS:  LINES/TUBES: ETT 4/2 >>  Right Internal jugular CVC 4/2 >>  Foley catheter 4/2 >>  OG tube 4/2 >>   DISCUSSION: Randy Nunez is a 75 y.o. male with diabetes mellitus, hypertenstion, CAD s/p CABG, chronic atrial fibrillation and HFrEF who presented to the emergency department with shortness of breath 4/2 with subsequent PEA cardiac arrest and intubation. Clinical picture consistent with cardiogenic edema.   PULMONARY A: Acute hypoxemic respiratory failure Acute cardiogenic pulmonary edema Possible HCAP, doubt P:   PRVC 8cc/kg Wean PEEP and Fio2 as able > wean FiO2 as able Volume removal as below  Add precedex -- see neuro SBT in am  Goal net NEG   CARDIOVASCULAR A:  Acute on chronic systolic congestive heart failure exacerbation Hx Afib  PEA arrest (likely respiratory etiology) Chronic atrial fibrillation (narrow complex) , tachycardic NSTEMI, ? Stress-related  Intermittent LBBB P:  Amiodarone gtt ASA, heparin gtt, statin  Lasix 5m IV BID  ? repeat 2D echo  Cardiology following - no plans cath   RENAL A; Acute on chronic kidney failure Hypokalemia  Hypernatremia   P:   Replace electrolytes as indicated Follow BMP Lasix to 60 BID, minimize IVF as able Watch hypernatremia with diuresis   GASTROINTESTINAL A:   SUP P:   PPI TF  HEMATOLOGIC A:   anticoag for A fib and ACS P:   Heparin gtt F/u cbc   INFECTIOUS A:   Possible HCAP P:   Empiric abx started 4/2 D/c'd levaquin 4/5, treated 4 days  ENDOCRINE A: DM P:   SSI per protocol Increase lantus 4/8 to 20units qhs   NEUROLOGIC A:   S/p Cardiac arrest Sedation for MV Agitation, delirium  P:   RASS goal: -1 Given short duration, fact that he did not require ACLS, deferred hypothermia Add precedex and wean fent gtt off as able  Attempt SBT in am   FAMILY  - Updates: no family available 4/8  - Inter-disciplinary family meeting performed with SColletta Maryland RN 11/08/15 >> Dr BLamonte Sakaimet with 2 sisters and his niece, reviewed pt's underlying problems, his current status and his prognosis. They understand that he has been stable for last few days but not in a place where he can be extubated. We agreed to continue medical management, efforts at diuresis through the weekend. If progress made then will discuss potentially extending his  care further with goal for extubation. If no progress through the weekend then we will discuss possible withdrawal of MV and transition to palliation. In the meantime we did agree that he would not benefit from CPR should he arrest or become unstable. I will place DNR orders on the chart. His sisters will update his daughters about his status and these plans. We will include them in any decision making regarding withdrawal vs continued support.    ATTENDING NOTE: I have personally reviewed patient's available data, including medical history, events of note, physical examination and test results as part of my evaluation. I have discussed with resident/NP Nickolas Madrid and other care team providers such as pharmacist, RN and RRT & co-ordinated with consultants.  75 y.o. year old male, admitted for resp distress, Pt with acute hypoxemic resp failure 2/2 cardiogenic pulm edema.  S/P PEA arrest. With NSTEMI. diurescing some. Doing daily SBT.   On exam, sedated, intubated. VSS. Crackles in  BLF. Gr 2 edema. Rest of exam per [facility].    Labs reviewed. Pertinent labs include the following:    Imaging reviewed personally.    Impression/Plan:  Acute Hypoxemic Respiratory Failure secondary to cardiogenic pulm edema.> cont vent support. Needs diuresis. Wean off sedation. Cont amiodarone drip. abx has been discontinued.  NSTEMI/demand ischemia > cont heparin drip, amiodarone drip.  Afib, chronic.   Cont TF Cont other meds.   Code status : DNR  No family around.    The patient is critically ill with multiple organ systems failure and requires high complexity decision making for assessment and support, frequent evaluation and titration of therapies, application of advanced monitoring technologies and extensive interpretation of multiple databases.   Critical Care Time devoted to patient care services described in this note independent of APP time is 35  minutes.   Monica Becton, MD 11/09/2015,   3:00 PM [place] [facility] Pager [phone] After 3 pm or if no answer, call [phone]

## 2015-11-09 NOTE — Progress Notes (Signed)
ANTICOAGULATION CONSULT NOTE  Pharmacy Consult for Heparin (while holding apixaban) Indication: chest pain/ACS and atrial fibrillation  Patient Measurements: Height: 5\' 11"  (180.3 cm) Weight: 204 lb 9.4 oz (92.8 kg) IBW/kg (Calculated) : 75.3  Vital Signs: Temp: 97.5 F (36.4 C) (04/08 1200) Temp Source: Oral (04/08 1200) BP: 98/58 mmHg (04/08 1500) Pulse Rate: 63 (04/08 1500)  Labs:  Recent Labs  11/07/15 0423  11/08/15 0449 11/09/15 0409 11/09/15 1100  HGB  --   --  12.2* 12.3*  --   HCT  --   --  38.8* 39.5  --   PLT  --   --  226 210  --   APTT 78*  --  74*  --   --   HEPARINUNFRC  --   < > 0.57 0.35 0.31  CREATININE 1.53*  --  1.44* 1.35*  --   < > = values in this interval not displayed.  Estimated Creatinine Clearance: 54.2 mL/min (by C-G formula based on Cr of 1.35).   Medical History: Past Medical History  Diagnosis Date  . Diabetes mellitus without complication (HCC)     x  8 yrs.  . Hypertension   . CAD (coronary artery disease)     a. s/p CABG 2001. b. NSTEMI 08/2013 - cath done, med rx recommended.  . Atrial fibrillation (HCC)     a. Dx 08/2013. Placed on eliquis. Plan DCCV as outpt. Also has had WCT c/w aberrancy.  . Chronic combined systolic and diastolic CHF (congestive heart failure) (HCC)   . Acute respiratory failure (HCC)     a. 08/2013 - VDRF due to pulm edema.   Assessment: 75 y/o M on apixaban PTA for afib, transfer from [facility] after brief cardiac arrest. He is on IV heparin for NSTEMI/afib (apixaban is on hold) and also noted with VDRF. Heparin drip 1100 uts/hr HL this am 0.35 but lost IV access so recheck HL after reseatr to ensure adequate levels.  No bleeding note CBC stable  Goal of Therapy:  Heparin level 0.3-0.7 units/ml aPTT 66-102 seconds Monitor platelets by anticoagulation protocol: Yes   Plan:  Continue heparin 1100 uts/hr -Daily CBC/HL  Leota Sauers Pharm.D. CPP, BCPS Clinical Pharmacist [phone] 11/09/2015 3:23 PM

## 2015-11-10 LAB — GLUCOSE, CAPILLARY
GLUCOSE-CAPILLARY: 309 mg/dL — AB (ref 65–99)
GLUCOSE-CAPILLARY: 263 mg/dL — AB (ref 65–99)
GLUCOSE-CAPILLARY: 202 mg/dL — AB (ref 65–99)
GLUCOSE-CAPILLARY: 169 mg/dL — AB (ref 65–99)
GLUCOSE-CAPILLARY: 106 mg/dL — AB (ref 65–99)
GLUCOSE-CAPILLARY: 150 mg/dL — AB (ref 65–99)
Glucose-Capillary: 316 mg/dL — ABNORMAL HIGH (ref 65–99)
Glucose-Capillary: 301 mg/dL — ABNORMAL HIGH (ref 65–99)
Glucose-Capillary: 346 mg/dL — ABNORMAL HIGH (ref 65–99)
Glucose-Capillary: 307 mg/dL — ABNORMAL HIGH (ref 65–99)
Glucose-Capillary: 251 mg/dL — ABNORMAL HIGH (ref 65–99)
Glucose-Capillary: 192 mg/dL — ABNORMAL HIGH (ref 65–99)
Glucose-Capillary: 168 mg/dL — ABNORMAL HIGH (ref 65–99)
Glucose-Capillary: 215 mg/dL — ABNORMAL HIGH (ref 65–99)

## 2015-11-10 LAB — CBC
HCT: 38.1 % — ABNORMAL LOW (ref 39.0–52.0)
Hemoglobin: 11.8 g/dL — ABNORMAL LOW (ref 13.0–17.0)
MCH: 29.4 pg (ref 26.0–34.0)
MCHC: 31 g/dL (ref 30.0–36.0)
MCV: 95 fL (ref 78.0–100.0)
Platelets: 201 K/uL (ref 150–400)
RBC: 4.01 MIL/uL — ABNORMAL LOW (ref 4.22–5.81)
RDW: 14.3 % (ref 11.5–15.5)
WBC: 11.1 K/uL — ABNORMAL HIGH (ref 4.0–10.5)

## 2015-11-10 LAB — BASIC METABOLIC PANEL WITH GFR
Anion gap: 10 (ref 5–15)
BUN: 51 mg/dL — ABNORMAL HIGH (ref 6–20)
CO2: 29 mmol/L (ref 22–32)
Calcium: 8.8 mg/dL — ABNORMAL LOW (ref 8.9–10.3)
Chloride: 108 mmol/L (ref 101–111)
Creatinine, Ser: 1.34 mg/dL — ABNORMAL HIGH (ref 0.61–1.24)
GFR calc Af Amer: 58 mL/min — ABNORMAL LOW
GFR calc non Af Amer: 50 mL/min — ABNORMAL LOW
Glucose, Bld: 352 mg/dL — ABNORMAL HIGH (ref 65–99)
Potassium: 3.9 mmol/L (ref 3.5–5.1)
Sodium: 147 mmol/L — ABNORMAL HIGH (ref 135–145)

## 2015-11-10 LAB — HEPARIN LEVEL (UNFRACTIONATED): Heparin Unfractionated: 0.34 IU/mL (ref 0.30–0.70)

## 2015-11-10 MED ORDER — SODIUM CHLORIDE 0.9 % IV SOLN
INTRAVENOUS | Status: DC
  Administered 2015-11-10: 2.5 [IU]/h via INTRAVENOUS
  Administered 2015-11-11: 5.8 [IU]/h via INTRAVENOUS
  Filled 2015-11-10 (×2): qty 2.5

## 2015-11-10 MED ORDER — FUROSEMIDE 10 MG/ML IJ SOLN
40.0000 mg | Freq: Two times a day (BID) | INTRAMUSCULAR | Status: AC
  Administered 2015-11-10 – 2015-11-11 (×2): 40 mg via INTRAVENOUS
  Filled 2015-11-10 (×2): qty 4

## 2015-11-10 MED ORDER — CLONAZEPAM 0.1 MG/ML ORAL SUSPENSION
0.5000 mg | Freq: Two times a day (BID) | ORAL | Status: DC
  Filled 2015-11-10: qty 5

## 2015-11-10 MED ORDER — CLONAZEPAM 0.5 MG PO TBDP
0.5000 mg | ORAL_TABLET | Freq: Two times a day (BID) | ORAL | Status: DC
  Administered 2015-11-10 – 2015-11-12 (×6): 0.5 mg via ORAL
  Filled 2015-11-10 (×8): qty 1

## 2015-11-10 MED ORDER — QUETIAPINE FUMARATE 50 MG PO TABS
25.0000 mg | ORAL_TABLET | Freq: Two times a day (BID) | ORAL | Status: DC
  Administered 2015-11-10 – 2015-11-12 (×6): 25 mg via ORAL
  Filled 2015-11-10 (×7): qty 1

## 2015-11-10 NOTE — Progress Notes (Signed)
Pt temp of 100.8. Notified MD to discuss. Plan to continue monitoring. If temp 101.5 or greater will consider cultures, antibiotics, and tylenol.

## 2015-11-10 NOTE — Progress Notes (Signed)
Pt became hypotensive at 2212. Called the box to discuss with RN. Decreased precedex to 0.3 and fentanyl to 70. Pt BP increased following changes. Will continue to monitor.

## 2015-11-10 NOTE — Progress Notes (Signed)
ANTICOAGULATION CONSULT NOTE  Pharmacy Consult for Heparin (while holding apixaban) Indication: chest pain/ACS and atrial fibrillation  Patient Measurements: Height: 5\' 11"  (180.3 cm) Weight: 209 lb 3.5 oz (94.9 kg) IBW/kg (Calculated) : 75.3  Vital Signs: Temp: 98.4 F (36.9 C) (04/09 0400) Temp Source: Oral (04/09 0400) BP: 136/59 mmHg (04/09 0726) Pulse Rate: 84 (04/09 0726)  Labs:  Recent Labs  11/08/15 0449 11/09/15 0409 11/09/15 1100 11/10/15 0300 11/10/15 0308  HGB 12.2* 12.3*  --  11.8*  --   HCT 38.8* 39.5  --  38.1*  --   PLT 226 210  --  201  --   APTT 74*  --   --   --   --   HEPARINUNFRC 0.57 0.35 0.31  --  0.34  CREATININE 1.44* 1.35*  --  1.34*  --     Estimated Creatinine Clearance: 55.1 mL/min (by C-G formula based on Cr of 1.34).   Medical History: Past Medical History  Diagnosis Date  . Diabetes mellitus without complication (HCC)     x  8 yrs.  . Hypertension   . CAD (coronary artery disease)     a. s/p CABG 2001. b. NSTEMI 08/2013 - cath done, med rx recommended.  . Atrial fibrillation (HCC)     a. Dx 08/2013. Placed on eliquis. Plan DCCV as outpt. Also has had WCT c/w aberrancy.  . Chronic combined systolic and diastolic CHF (congestive heart failure) (HCC)   . Acute respiratory failure (HCC)     a. 08/2013 - VDRF due to pulm edema.   Assessment: 75 y/o M on apixaban PTA for afib, transfer from [facility] after brief cardiac arrest. He is on IV heparin for NSTEMI/afib (apixaban is on hold) and also noted with VDRF. Heparin drip 1100 uts/hr HL this am 0.34.  No bleeding note CBC stable  Goal of Therapy:  Heparin level 0.3-0.7 units/ml aPTT 66-102 seconds Monitor platelets by anticoagulation protocol: Yes   Plan:  Continue heparin 1100 uts/hr -Daily CBC/HL  Leota Sauers Pharm.D. CPP, BCPS Clinical Pharmacist [phone] 11/10/2015 7:34 AM

## 2015-11-10 NOTE — Progress Notes (Signed)
Patient Name: Randy Nunez Date of Encounter: 11/10/2015  Active Problems:   Essential hypertension, benign   Acute on chronic combined systolic and diastolic CHF (congestive heart failure) ([place])   Atrial fibrillation ([place])   Cardiac arrest ([place])   Acute congestive heart failure ([place])   Respiratory arrest ([place])   Pressure ulcer   Length of Stay: 7  SUBJECTIVE  75 yo admitted with respiratory failure and cardiac ( PEA ) arrest.   The patient is intubated, aggitated this am  Eyes open,    CURRENT MEDS . antiseptic oral rinse  7 mL Mouth Rinse 10 times per day  . aspirin  81 mg Per Tube Daily  . chlorhexidine gluconate (SAGE KIT)  15 mL Mouth Rinse BID  . famotidine  20 mg Oral BID  . feeding supplement (PRO-STAT SUGAR FREE 64)  30 mL Per Tube TID  . furosemide  60 mg Intravenous BID  . insulin aspart  0-15 Units Subcutaneous 6 times per day  . insulin glargine  20 Units Subcutaneous QHS  . metoprolol tartrate  25 mg Per Tube BID  . potassium chloride  40 mEq Per Tube Daily  . pravastatin  40 mg Oral q1800  . sodium chloride flush  10-40 mL Intracatheter Q12H   . amiodarone 30 mg/hr (11/09/15 2155)  . dexmedetomidine 0.4 mcg/kg/hr (11/10/15 0400)  . feeding supplement (VITAL AF 1.2 CAL) 1,000 mL (11/09/15 2334)  . fentaNYL infusion INTRAVENOUS 125 mcg/hr (11/09/15 2337)  . heparin 1,100 Units/hr (11/10/15 0239)   OBJECTIVE  Filed Vitals:   11/10/15 0500 11/10/15 0600 11/10/15 0700 11/10/15 0726  BP: 101/58 119/62 136/59 136/59  Pulse: 57 81 86 84  Temp:      TempSrc:      Resp: '16 16 21 16  '$ Height:      Weight:      SpO2: 100% 100% 99% 99%    Intake/Output Summary (Last 24 hours) at 11/10/15 0748 Last data filed at 11/10/15 0700  Gross per 24 hour  Intake 3272.56 ml  Output   3100 ml  Net 172.56 ml   Filed Weights   11/08/15 0500 11/09/15 0400 11/10/15 0315  Weight: 206 lb 5.6 oz (93.6 kg) 204 lb 9.4 oz (92.8 kg) 209 lb 3.5 oz (94.9 kg)   PHYSICAL  EXAM  General: intubated sedated Psych: Normal affect. HEENT:  Normal  Neck: Supple without bruits or JVD. Lungs:  Resp regular and unlabored, some rales at bases. Heart: RRR no s3, s4, or murmurs. Abdomen: Soft, non-tender, non-distended, BS + x 4.  Extremities: No clubbing, cyanosis or edema. DP/PT/Radials 2+ and equal bilaterally. Neuro - awake,  Not sure he is lucid   Accessory Clinical Findings  CBC  Recent Labs  11/09/15 0409 11/10/15 0300  WBC 11.3* 11.1*  HGB 12.3* 11.8*  HCT 39.5 38.1*  MCV 94.7 95.0  PLT 210 374   Basic Metabolic Panel  Recent Labs  11/08/15 0449 11/09/15 0409 11/10/15 0300  NA 143 146* 147*  K 4.3 3.8 3.9  CL 104 105 108  CO2 '28 29 29  '$ GLUCOSE 344* 330* 352*  BUN 40* 47* 51*  CREATININE 1.44* 1.35* 1.34*  CALCIUM 9.0 8.7* 8.8*  MG 2.2 2.1  --   PHOS 4.7*  --   --    Liver Function Tests No results for input(s): AST, ALT, ALKPHOS, BILITOT, PROT, ALBUMIN in the last 72 hours. No results for input(s): CKTOTAL, CKMB, CKMBINDEX, TROPONINI in the last  72 hours. No results for input(s): CHOL, HDL, LDLCALC, TRIG, CHOLHDL, LDLDIRECT in the last 72 hours. Dg Chest Port 1 View  11/09/2015  CLINICAL DATA:  75 year old male with a history of acute respiratory failure. EXAM: PORTABLE CHEST 1 VIEW COMPARISON:  11/08/2015, 11/07/2015 FINDINGS: Cardiomediastinal silhouette unchanged in size and contour. Surgical changes of median sternotomy and CABG. Endotracheal tube appears to terminate approximately 5.4 cm above the carina. Gastric tube terminates out of the field of view. Right IJ approach central venous catheter appears to terminate in the superior vena cava. Similar appearance of mixed interstitial and airspace opacities bilateral lungs. No pneumothorax. No displaced fracture. IMPRESSION: Similar appearance of the chest x-ray with persisting mixed interstitial and airspace opacity, potentially a combination of atelectasis, edema, and/ or consolidation.  No large pleural effusion. Endotracheal tube has been slightly withdrawn, and terminates suitably above the carina, measuring approximately 5.5 cm. Otherwise unchanged support apparatus. Signed, Dulcy Fanny. Earleen Newport, DO [facility] Radiology Electronically Signed   By: Corrie Mckusick D.O.   On: 11/09/2015 07:08   Dg Chest Port 1 View  11/08/2015  CLINICAL DATA:  Acute respiratory failure EXAM: PORTABLE CHEST 1 VIEW COMPARISON:  11/07/2015; 11/05/2015; 09/12/2015; 08/29/2015 FINDINGS: Grossly unchanged enlarged cardiac silhouette and mediastinal contours post median sternotomy and CABG. Stable position of support apparatus. Pulmonary vasculature remains indistinct with cephalization of flow. Bilateral mid and lower lung heterogeneous opacities, right greater than left, are grossly unchanged. No new focal airspace opacities. Unchanged suspected trace bilateral effusions. No pneumothorax. Unchanged bones. IMPRESSION: 1.  Stable positioning of support apparatus.  No pneumothorax. 2. Similar findings of pulmonary edema and bibasilar opacities, right greater than left, atelectasis versus infiltrate. Electronically Signed   By: Sandi Mariscal M.D.   On: 11/08/2015 07:55   Dg Chest Port 1 View  11/07/2015  CLINICAL DATA:  Ventilator dependent respiratory failure EXAM: PORTABLE CHEST 1 VIEW COMPARISON:  Portable chest x-ray of 11/05/2015 and 11/07/2015 FINDINGS: There is little change in probable interstitial edema pattern with small effusions and cardiomegaly. The tip of the endotracheal tube is only 1.4 cm above the carina. Right IJ central venous line overlies the lower SVC and and NG tube coils in the stomach. Mild cardiomegaly is stable. IMPRESSION: 1. Little change in interstitial edema pattern. 2. Tip of endotracheal tube only 1.4 cm above the carina. Electronically Signed   By: Ivar Drape M.D.   On: 11/07/2015 08:13   Dg Chest Port 1 View  11/05/2015  CLINICAL DATA:   Respiratory failure. EXAM: PORTABLE CHEST 1 VIEW COMPARISON:  11/18/2015 FINDINGS: Endotracheal tube, right central line and NG tube remain in place, unchanged. Prior CABG. Heart is mildly enlarged with vascular congestion. Interstitial prominence may reflect interstitial edema, improving since prior study. No confluent opacities or effusions. IMPRESSION: Improving interstitial edema pattern. Support devices are stable. Electronically Signed   By: Rolm Baptise M.D.   On: 11/05/2015 11:06   Dg Chest Portable 1 View  11/11/2015  CLINICAL DATA:  Acute respiratory failure.  Central line placement. EXAM: PORTABLE CHEST 1 VIEW COMPARISON:  11/15/2015 FINDINGS: The endotracheal tube and NG tubes are stable. New right IJ central venous catheter tip is in the distal SVC near the cavoatrial junction. No complicating features. Persistent cardiac enlargement and diffuse pulmonary edema. IMPRESSION: Right IJ center venous catheter tip is in the distal SVC. The endotracheal tube and NG tubes are stable. Persistent pulmonary edema. Electronically Signed   By: Ricky Stabs.D.  On: 11/30/2015 17:39   Dg Chest Portable 1 View  11/18/2015  CLINICAL DATA:  Sudden shortness of breath, diaphoretic. EXAM: PORTABLE CHEST 1 VIEW COMPARISON:  Chest x-rays dated 09/12/2015 and 08/22/2013. FINDINGS: Cardiomegaly is stable. Median sternotomy wires are stable in alignment. Endotracheal tube appears well positioned with tip just above the level of the carina. Enteric tube passes below the diaphragm. There is central pulmonary vascular congestion and bilateral interstitial edema suggesting volume overload/ CHF. No pleural effusion or pneumothorax seen. IMPRESSION: 1. Cardiomegaly with central pulmonary vascular congestion and bilateral interstitial edema indicating CHF/volume overload. 2. Endotracheal tube well positioned with tip approximately 2 cm above the level of the carina. Electronically Signed   By: Franki Cabot M.D.   On:  11/06/2015 15:57   TELE: a-fib with RVR   ASSESSMENT AND PLAN  # NSTEMI - Troponin elevation in the setting of respiratory failure - The patient required intubation on 4/2 sec to respiratory failure, he is intubated and sedated, follows commends, denies chest pain.   # PEA Arrest - May have been due to above versus primary respiratory etiology, such as pulmonary edema or pneumonia.  - We accordingly agree with diuretic & antimicrobial efforts.  Will DC amiodarone    # h/o Chronic Afib - We have transitioned him from Apixaban to Heparin in the setting of ACS.      # h/o HFrEF -  . - start lasix 40 mg iv BID, Crea improving 1.34  # h/o HTN -     On  Metoprolol suspension    Nahser, Wonda Cheng, MD  11/10/2015 7:48 AM    [place],  [place], [place]  [address] Pager [phone] Phone: [phone]; Fax: [phone]   [facility]  [address] [place],   [address] [phone]   Fax [phone]

## 2015-11-10 NOTE — Progress Notes (Signed)
PULMONARY / CRITICAL CARE MEDICINE   Name: Randy Nunez MRN: [id number] DOB: 15-May-1941    ADMISSION DATE:  11/21/2015  CHIEF COMPLAINT:  Respiratory failure and cardiac arrest  HISTORY OF PRESENT ILLNESS:   Randy Nunez is a 75 y.o. male with diabetes mellitus, hypertenstion, CAD s/p CABG, chronic atrial fibrillation and HFrEF who presented to outside emergency department with shortness of breath 4/2.  Had subsequent brief PEA arrest with ROSC after intubation.  He was then transferred to [facility] ICU.  Picture c/w cardiogenic pulmonary edema.    SUBJECTIVE:  Vent needs improving.  On 40% FiO2.  Extremely agitated this am on WUA.  On insulin gtt. meds for anxiety were changed. Failed PST today.    VITAL SIGNS: BP 136/59 mmHg  Pulse 84  Temp(Src) 98.4 F (36.9 C) (Oral)  Resp 16  Ht '5\' 11"'$  (1.803 m)  Wt 94.9 kg (209 lb 3.5 oz)  BMI 29.19 kg/m2  SpO2 99%  HEMODYNAMICS:    VENTILATOR SETTINGS: Vent Mode:  [-] PRVC FiO2 (%):  [40 %-50 %] 40 % Set Rate:  [16 bmp] 16 bmp Vt Set:  [550 mL] 550 mL PEEP:  [8 cmH20] 8 cmH20 Plateau Pressure:  [21 cmH20-23 cmH20] 21 cmH20  INTAKE / OUTPUT: I/O last 3 completed shifts: In: 4529.3 [I.V.:2259.3; NG/GT:2270] Out: 5200 [Urine:5200]  PHYSICAL EXAMINATION: General: chronically ill appearing male, NAD  Neuro: sedated, RASS -2, very agitated this am per RN HEENT: Moist mucous membranes, ETT in place Cardiovascular: Irregularly irregular, no murmurs, previous sternotomy scar noted, afib 90's Lungs: resps even, non labored on vent, mild tachypnea, faint bibasilar crackles  Abdomen: Obese, soft, nontender, no appreciable hepatosplenomegaly Musculoskeletal: Warm extremities, scant BLE edema Skin: Hyperpigmented skin in feet  LABS:  BMET  Recent Labs Lab 11/08/15 0449 11/09/15 0409 11/10/15 0300  NA 143 146* 147*  K 4.3 3.8 3.9  CL 104 105 108  CO2 '28 29 29  '$ BUN 40* 47* 51*  CREATININE 1.44* 1.35* 1.34*  GLUCOSE  344* 330* 352*    Electrolytes  Recent Labs Lab 11/06/15 0420 11/07/15 0423 11/08/15 0449 11/09/15 0409 11/10/15 0300  CALCIUM 8.5* 8.5* 9.0 8.7* 8.8*  MG 1.9 2.0 2.2 2.1  --   PHOS 3.0  --  4.7*  --   --     CBC  Recent Labs Lab 11/08/15 0449 11/09/15 0409 11/10/15 0300  WBC 9.8 11.3* 11.1*  HGB 12.2* 12.3* 11.8*  HCT 38.8* 39.5 38.1*  PLT 226 210 201    Coag's  Recent Labs Lab 11/09/2015 2330  11/06/15 0420 11/07/15 0423 11/08/15 0449  APTT 33  < > 78* 78* 74*  INR 1.22  --   --   --   --   < > = values in this interval not displayed.  Sepsis Markers  Recent Labs Lab 11/10/2015 1630 11/10/2015 2030 11/04/15 0340  LATICACIDVEN 8.40* 2.6* 1.9    ABG  Recent Labs Lab 11/28/2015 1620 12/01/2015 2038  PHART 7.221* 7.360  PCO2ART 50.6* 46.1*  PO2ART 116* 77.0*    Liver Enzymes  Recent Labs Lab 11/13/2015 1545 11/04/15 0340  AST 140* 94*  ALT 80* 70*  ALKPHOS 117 76  BILITOT 0.8 0.9  ALBUMIN 4.1 3.0*    Cardiac Enzymes  Recent Labs Lab 11/02/2015 2225 11/04/15 0340 11/05/15 0857  TROPONINI 1.62* 3.13* 1.66*    Glucose  Recent Labs Lab 11/09/15 1258 11/09/15 1542 11/09/15 2011 11/09/15 2333 11/10/15 0305 11/10/15 0827  GLUCAP 306*  234* 247* 290* 316* 309*    Imaging No results found.   STUDIES:   CULTURES: Resp 4/2 >> normal flora Blood 4/2 >> neg Urine 4/2 >> negative  ANTIBIOTICS: vanco 4/2 >> 4/3 Aztreonam 4/2 >> 4/3 levaquin 4/2 >> 4/5  SIGNIFICANT EVENTS:  LINES/TUBES: ETT 4/2 >>  Right Internal jugular CVC 4/2 >>  Foley catheter 4/2 >>  OG tube 4/2 >>   DISCUSSION: Randy Nunez is a 75 y.o. male with diabetes mellitus, hypertenstion, CAD s/p CABG, chronic atrial fibrillation and HFrEF who presented to the emergency department with shortness of breath 4/2 with subsequent PEA cardiac arrest and intubation. Clinical picture consistent with cardiogenic edema.   PULMONARY A: Acute hypoxemic respiratory  failure Acute cardiogenic pulmonary edema Possible HCAP, doubt P:   PRVC 8cc/kg Volume removal as below  Daily SBT >  Will adjust anxiety meds to facilitate weaning Control agitation - see neuro  Goal net NEG   CARDIOVASCULAR A:  Acute on chronic systolic congestive heart failure exacerbation Hx Afib  PEA arrest (likely respiratory etiology) Chronic atrial fibrillation (narrow complex) , tachycardic NSTEMI, ? Stress-related  Intermittent LBBB P:  Off amio  ASA, heparin gtt, statin  Change lasix to '40mg'$  BID = watch Na  Cardiology following - no plans cath   RENAL A; Acute on chronic kidney failure Hypokalemia  Hypernatremia  P:   Replace electrolytes as indicated Follow BMP Lasix to 40 BID Watch hypernatremia with diuresis   GASTROINTESTINAL A:   SUP P:   PPI TF  HEMATOLOGIC A:   anticoag for A fib and ACS P:  Heparin gtt F/u cbc   INFECTIOUS A:   Possible HCAP P:   S/o course levaquin   ENDOCRINE A: DM P:   Insulin gtt per protocol   NEUROLOGIC A:   S/p Cardiac arrest Sedation for MV Agitation, delirium  P:   RASS goal: -1 Given short duration, fact that he did not require ACLS, deferred hypothermia Continue precedex gtt  Add enteral seroquel, low dose clonazepam and wean off fent gtt  Daily WUA   FAMILY  - Updates: no family available 4/9  - Inter-disciplinary family meeting performed with Colletta Maryland, RN 11/08/15 >> Dr Lamonte Sakai met with 2 sisters and his niece, reviewed pt's underlying problems, his current status and his prognosis. They understand that he has been stable for last few days but not in a place where he can be extubated. We agreed to continue medical management, efforts at diuresis through the weekend. If progress made then will discuss potentially extending his care further with goal for extubation. If no progress through the weekend then we will discuss possible withdrawal of MV and transition to palliation. In the meantime we did  agree that he would not benefit from CPR should he arrest or become unstable. I will place DNR orders on the chart. His sisters will update his daughters about his status and these plans. We will include them in any decision making regarding withdrawal vs continued support.   Critical care time today : 33 minutes  Nickolas Madrid, NP 11/10/2015  10:47 AM Pager: [phone] or [phone]  We formulated plan together. A and P as above. Needs to control agitation to facilitate weaning. Diuresce.  Monica Becton, MD 11/10/2015, 4:27 PM [facility] Pager [phone] After 3 pm or if no answer, call [phone]

## 2015-11-11 ENCOUNTER — Inpatient Hospital Stay (HOSPITAL_COMMUNITY): Payer: PPO

## 2015-11-11 DIAGNOSIS — J810 Acute pulmonary edema: Secondary | ICD-10-CM

## 2015-11-11 DIAGNOSIS — I509 Heart failure, unspecified: Secondary | ICD-10-CM

## 2015-11-11 LAB — CBC
HEMATOCRIT: 38.2 % — AB (ref 39.0–52.0)
HEMOGLOBIN: 11.8 g/dL — AB (ref 13.0–17.0)
MCH: 29.5 pg (ref 26.0–34.0)
MCHC: 30.9 g/dL (ref 30.0–36.0)
MCV: 95.5 fL (ref 78.0–100.0)
Platelets: 223 10*3/uL (ref 150–400)
RBC: 4 MIL/uL — AB (ref 4.22–5.81)
RDW: 14.4 % (ref 11.5–15.5)
WBC: 10.5 10*3/uL (ref 4.0–10.5)

## 2015-11-11 LAB — BASIC METABOLIC PANEL
ANION GAP: 11 (ref 5–15)
BUN: 57 mg/dL — AB (ref 6–20)
CHLORIDE: 113 mmol/L — AB (ref 101–111)
CO2: 30 mmol/L (ref 22–32)
Calcium: 8.8 mg/dL — ABNORMAL LOW (ref 8.9–10.3)
Creatinine, Ser: 1.53 mg/dL — ABNORMAL HIGH (ref 0.61–1.24)
GFR calc Af Amer: 49 mL/min — ABNORMAL LOW (ref >60)
GFR calc non Af Amer: 42 mL/min — ABNORMAL LOW (ref >60)
Glucose, Bld: 169 mg/dL — ABNORMAL HIGH (ref 65–99)
POTASSIUM: 3.7 mmol/L (ref 3.5–5.1)
SODIUM: 154 mmol/L — AB (ref 135–145)

## 2015-11-11 LAB — GLUCOSE, CAPILLARY
GLUCOSE-CAPILLARY: 193 mg/dL — AB (ref 65–99)
GLUCOSE-CAPILLARY: 142 mg/dL — AB (ref 65–99)
GLUCOSE-CAPILLARY: 137 mg/dL — AB (ref 65–99)
GLUCOSE-CAPILLARY: 157 mg/dL — AB (ref 65–99)
GLUCOSE-CAPILLARY: 142 mg/dL — AB (ref 65–99)
GLUCOSE-CAPILLARY: 168 mg/dL — AB (ref 65–99)
Glucose-Capillary: 136 mg/dL — ABNORMAL HIGH (ref 65–99)
Glucose-Capillary: 143 mg/dL — ABNORMAL HIGH (ref 65–99)
Glucose-Capillary: 150 mg/dL — ABNORMAL HIGH (ref 65–99)
Glucose-Capillary: 226 mg/dL — ABNORMAL HIGH (ref 65–99)
Glucose-Capillary: 130 mg/dL — ABNORMAL HIGH (ref 65–99)
Glucose-Capillary: 157 mg/dL — ABNORMAL HIGH (ref 65–99)
Glucose-Capillary: 179 mg/dL — ABNORMAL HIGH (ref 65–99)
Glucose-Capillary: 242 mg/dL — ABNORMAL HIGH (ref 65–99)
Glucose-Capillary: 316 mg/dL — ABNORMAL HIGH (ref 65–99)

## 2015-11-11 LAB — MAGNESIUM: Magnesium: 2.3 mg/dL (ref 1.7–2.4)

## 2015-11-11 LAB — HEPARIN LEVEL (UNFRACTIONATED): HEPARIN UNFRACTIONATED: 0.29 [IU]/mL — AB (ref 0.30–0.70)

## 2015-11-11 MED ORDER — FUROSEMIDE 10 MG/ML IJ SOLN
40.0000 mg | Freq: Four times a day (QID) | INTRAMUSCULAR | Status: AC
  Administered 2015-11-11 (×3): 40 mg via INTRAVENOUS
  Filled 2015-11-11 (×3): qty 4

## 2015-11-11 MED ORDER — VITAL AF 1.2 CAL PO LIQD
1000.0000 mL | ORAL | Status: DC
  Administered 2015-11-11 – 2015-11-12 (×4): 1000 mL

## 2015-11-11 MED ORDER — FREE WATER
250.0000 mL | Freq: Four times a day (QID) | Status: DC
  Administered 2015-11-11 – 2015-11-13 (×8): 250 mL

## 2015-11-11 MED ORDER — INSULIN GLARGINE 100 UNIT/ML ~~LOC~~ SOLN
10.0000 [IU] | Freq: Two times a day (BID) | SUBCUTANEOUS | Status: DC
  Administered 2015-11-11: 10 [IU] via SUBCUTANEOUS
  Filled 2015-11-11 (×2): qty 0.1

## 2015-11-11 MED ORDER — FENTANYL BOLUS VIA INFUSION
50.0000 ug | INTRAVENOUS | Status: DC | PRN
  Administered 2015-11-11 – 2015-11-12 (×3): 50 ug via INTRAVENOUS
  Filled 2015-11-11: qty 50

## 2015-11-11 MED ORDER — INSULIN GLARGINE 100 UNIT/ML ~~LOC~~ SOLN
15.0000 [IU] | Freq: Two times a day (BID) | SUBCUTANEOUS | Status: DC
  Administered 2015-11-11 – 2015-11-12 (×3): 15 [IU] via SUBCUTANEOUS
  Filled 2015-11-11 (×7): qty 0.15

## 2015-11-11 MED ORDER — ACETAMINOPHEN 160 MG/5ML PO SOLN: 650.0000 mg | ORAL | Status: DC | PRN

## 2015-11-11 MED ORDER — DEXTROSE 5 % IV SOLN
INTRAVENOUS | Status: AC
  Administered 2015-11-11: 10:00:00 via INTRAVENOUS

## 2015-11-11 MED ORDER — INSULIN ASPART 100 UNIT/ML ~~LOC~~ SOLN
0.0000 [IU] | SUBCUTANEOUS | Status: DC
  Administered 2015-11-11: 15 [IU] via SUBCUTANEOUS
  Administered 2015-11-11: 7 [IU] via SUBCUTANEOUS
  Administered 2015-11-12: 15 [IU] via SUBCUTANEOUS
  Administered 2015-11-12 (×2): 11 [IU] via SUBCUTANEOUS
  Administered 2015-11-12 (×2): 15 [IU] via SUBCUTANEOUS
  Administered 2015-11-12: 11 [IU] via SUBCUTANEOUS
  Administered 2015-11-12: 20 [IU] via SUBCUTANEOUS
  Administered 2015-11-13: 15 [IU] via SUBCUTANEOUS

## 2015-11-11 MED ORDER — POTASSIUM CHLORIDE 20 MEQ/15ML (10%) PO SOLN
40.0000 meq | Freq: Three times a day (TID) | ORAL | Status: AC
  Administered 2015-11-11 (×2): 40 meq
  Filled 2015-11-11 (×2): qty 30

## 2015-11-11 MED ORDER — PRO-STAT SUGAR FREE PO LIQD
30.0000 mL | Freq: Two times a day (BID) | ORAL | Status: DC
  Administered 2015-11-11 – 2015-11-12 (×3): 30 mL
  Filled 2015-11-11 (×3): qty 30

## 2015-11-11 NOTE — Progress Notes (Signed)
Nutrition Follow-up   INTERVENTION:   Increase Vital AF 1.2 to 65 ml/hr Decrease Prostat to 30 ml BID Provides: 2072 kcal, 147 grams protein, and 1265 ml H2O.  Total free water: 2265 ml   NUTRITION DIAGNOSIS:   Inadequate oral intake related to inability to eat as evidenced by NPO status. Ongoing.   GOAL:   Patient will meet greater than or equal to 90% of their needs Met.   MONITOR:   TF tolerance, Vent status, Labs, Weight trends, I & O's  ASSESSMENT:   75 y.o. male with DM, HTN, CAD s/p CABG, chronic atrial fibrillation and HFrEF who presented to the emergency department with shortness of breath earlier today. History is obtained by reports and chart review as his family who is present in our ICU was not with him during these events.  Patient is currently intubated on ventilator support MV: 9.2 L/min Temp (24hrs), Avg:100.4 F (38 C), Min:100.2 F (37.9 C), Max:100.8 F (38.2 C)  Propofol: off Free water: 250 ml every 6 hours = 1000 ml Medications reviewed and include: KCl Labs reviewed: sodium elevated 154 CBG's: 130-168 OG tube: Vital AF 1.2 @ 55 ml/hr with 30 ml Prostat TID Possible extubation tomorrow.   Diet Order:  Diet NPO time specified Except for: Sips with Meds  Skin:  Wound (see comment) (stage II sacrum)  Last BM:  unknown  Height:   Ht Readings from Last 1 Encounters:  11/04/15 '5\' 11"'$  (1.803 m)    Weight:   Wt Readings from Last 1 Encounters:  11/11/15 205 lb 7.5 oz (93.2 kg)    Ideal Body Weight:  78.1 kg  BMI:  Body mass index is 28.67 kg/(m^2).  Estimated Nutritional Needs:   Kcal:  2074  Protein:  135-145 gm  Fluid:  per MD  EDUCATION NEEDS:   No education needs identified at this time  [place], [place], [place] Pager [phone] After Hours Pager

## 2015-11-11 NOTE — Progress Notes (Signed)
ANTICOAGULATION CONSULT NOTE  Pharmacy Consult for Heparin (while holding apixaban) Indication: chest pain/ACS and atrial fibrillation  Patient Measurements: Height: 5\' 11"  (180.3 cm) Weight: 209 lb 3.5 oz (94.9 kg) IBW/kg (Calculated) : 75.3  Vital Signs: Temp: 100.4 F (38 C) (04/10 0400) Temp Source: Oral (04/10 0400) BP: 123/102 mmHg (04/10 0400) Pulse Rate: 101 (04/10 0400)  Labs:  Recent Labs  11/09/15 0409 11/09/15 1100 11/10/15 0300 11/10/15 0308 11/11/15 0212 11/11/15 0320  HGB 12.3*  --  11.8*  --  11.8*  --   HCT 39.5  --  38.1*  --  38.2*  --   PLT 210  --  201  --  223  --   HEPARINUNFRC 0.35 0.31  --  0.34  --  0.29*  CREATININE 1.35*  --  1.34*  --  1.53*  --     Estimated Creatinine Clearance: 48.3 mL/min (by C-G formula based on Cr of 1.53).  Assessment: 75 y/o M on apixaban PTA for afib, transfer from [facility] after brief cardiac arrest. He is on IV heparin for NSTEMI/afib (apixaban is on hold). Heparin level now down to subtherapeutic on heparin drip 1100 uts/hr. No issues with line or bleeding reported per RN. CBC stable.  Goal of Therapy:  Heparin level 0.3-0.7 units/ml aPTT 66-102 seconds Monitor platelets by anticoagulation protocol: Yes   Plan:  Increase heparin to 1250 uts/hr F/u 8 hr heparin level  Christoper Fabian, PharmD, BCPS Clinical pharmacist, pager [phone] 11/11/2015 5:33 AM

## 2015-11-11 NOTE — Progress Notes (Signed)
Pt increased agitation around 0100. Not in sync with vent. Rhonchi in upper lobes bilaterally. Tachycardic and Hypertensive. Increased sedation and analgesia. MD informed. Plan to acquire EKG, CXR, and labs. Now pt resting. HR and BP decreased.

## 2015-11-11 NOTE — Progress Notes (Signed)
Pt placed on C/PS by MD.  Pt did not tolerate and had increased heart rate.  Pt placed back on full support.  RT will continue to monitor.

## 2015-11-11 NOTE — Progress Notes (Addendum)
SUBJECTIVE:  Increased agitation while sedation weaned today.  CCM may try to extubate tomorrow per the nurse  OBJECTIVE:   Vitals:   Filed Vitals:   11/11/15 0737 11/11/15 0742 11/11/15 0800 11/11/15 0826  BP:  100/54 96/50   Pulse:  77 69 75  Temp: 100.3 F (37.9 C)     TempSrc: Oral     Resp:  16 18 19   Height:      Weight:      SpO2:  93% 91% 88%   I&O's:   Intake/Output Summary (Last 24 hours) at 11/11/15 0948 Last data filed at 11/11/15 0900  Gross per 24 hour  Intake 2880.73 ml  Output   3955 ml  Net -1074.27 ml   TELEMETRY: Reviewed telemetry pt in AFib:     PHYSICAL EXAM General: Intubated , sedated Head:   Normal cephalic and atramatic  Lungs:   Clear bilaterally to auscultation. Heart:   Irregularly irregular S1 S2  No JVD.   Abdomen: abdomen soft and non-tender Msk:  Back normal,  Normal strength and tone for age. Extremities:  Tr leg edema.   Neuro: Alert per the nurse, and able to follow commands when sedation is lightened Psych:  Intubated, sedated Skin: No rash   LABS: Basic Metabolic Panel:  Recent Labs  16/10/96 0409 11/10/15 0300 11/11/15 0212  NA 146* 147* 154*  K 3.8 3.9 3.7  CL 105 108 113*  CO2 29 29 30   GLUCOSE 330* 352* 169*  BUN 47* 51* 57*  CREATININE 1.35* 1.34* 1.53*  CALCIUM 8.7* 8.8* 8.8*  MG 2.1  --  2.3   Liver Function Tests: No results for input(s): AST, ALT, ALKPHOS, BILITOT, PROT, ALBUMIN in the last 72 hours. No results for input(s): LIPASE, AMYLASE in the last 72 hours. CBC:  Recent Labs  11/10/15 0300 11/11/15 0212  WBC 11.1* 10.5  HGB 11.8* 11.8*  HCT 38.1* 38.2*  MCV 95.0 95.5  PLT 201 223   Cardiac Enzymes: No results for input(s): CKTOTAL, CKMB, CKMBINDEX, TROPONINI in the last 72 hours. BNP: Invalid input(s): POCBNP D-Dimer: No results for input(s): DDIMER in the last 72 hours. Hemoglobin A1C: No results for input(s): HGBA1C in the last 72 hours. Fasting Lipid Panel: No results for  input(s): CHOL, HDL, LDLCALC, TRIG, CHOLHDL, LDLDIRECT in the last 72 hours. Thyroid Function Tests: No results for input(s): TSH, T4TOTAL, T3FREE, THYROIDAB in the last 72 hours.  Invalid input(s): FREET3 Anemia Panel: No results for input(s): VITAMINB12, FOLATE, FERRITIN, TIBC, IRON, RETICCTPCT in the last 72 hours. Coag Panel:   Lab Results  Component Value Date   INR 1.22 11-Nov-2015   INR 1.30 09/12/2015   INR 1.15 08/29/2015    RADIOLOGY: Dg Chest Port 1 View  11/11/2015  CLINICAL DATA:  Acute onset of respiratory failure. Initial encounter. EXAM: PORTABLE CHEST 1 VIEW COMPARISON:  Chest radiograph performed 11/09/2015 FINDINGS: The patient's endotracheal tube is seen ending 3 cm above the carina. An enteric tube is noted extending below the diaphragm. A right IJ line is noted ending about the distal SVC. Mild bibasilar opacities, left greater than right, may reflect mild interstitial edema or possibly pneumonia. No definite pleural effusion or pneumothorax is seen. The cardiomediastinal silhouette is mildly enlarged. The patient is status post median sternotomy. No acute osseous abnormalities are seen. IMPRESSION: 1. Endotracheal tube seen ending 3 cm above the carina. 2. Mild bibasilar opacities, left greater than right, may reflect mild interstitial edema or possibly pneumonia. 3.  Mild cardiomegaly. Electronically Signed   By: Roanna Raider M.D.   On: 11/11/2015 01:57   Dg Chest Port 1 View  11/09/2015  CLINICAL DATA:  75 year old male with a history of acute respiratory failure. EXAM: PORTABLE CHEST 1 VIEW COMPARISON:  11/08/2015, 11/07/2015 FINDINGS: Cardiomediastinal silhouette unchanged in size and contour. Surgical changes of median sternotomy and CABG. Endotracheal tube appears to terminate approximately 5.4 cm above the carina. Gastric tube terminates out of the field of view. Right IJ approach central venous catheter appears to terminate in the superior vena cava. Similar  appearance of mixed interstitial and airspace opacities bilateral lungs. No pneumothorax. No displaced fracture. IMPRESSION: Similar appearance of the chest x-ray with persisting mixed interstitial and airspace opacity, potentially a combination of atelectasis, edema, and/ or consolidation. No large pleural effusion. Endotracheal tube has been slightly withdrawn, and terminates suitably above the carina, measuring approximately 5.5 cm. Otherwise unchanged support apparatus. Signed, Yvone Neu. Loreta Ave, DO [facility] Electronically Signed   By: Gilmer Mor D.O.   On: 11/09/2015 07:08   Dg Chest Port 1 View  11/08/2015  CLINICAL DATA:  Acute respiratory failure EXAM: PORTABLE CHEST 1 VIEW COMPARISON:  11/07/2015; 11/05/2015; 09/12/2015; 08/29/2015 FINDINGS: Grossly unchanged enlarged cardiac silhouette and mediastinal contours post median sternotomy and CABG. Stable position of support apparatus. Pulmonary vasculature remains indistinct with cephalization of flow. Bilateral mid and lower lung heterogeneous opacities, right greater than left, are grossly unchanged. No new focal airspace opacities. Unchanged suspected trace bilateral effusions. No pneumothorax. Unchanged bones. IMPRESSION: 1.  Stable positioning of support apparatus.  No pneumothorax. 2. Similar findings of pulmonary edema and bibasilar opacities, right greater than left, atelectasis versus infiltrate. Electronically Signed   By: Simonne Come M.D.   On: 11/08/2015 07:55   Dg Chest Port 1 View  11/07/2015  CLINICAL DATA:  Ventilator dependent respiratory failure EXAM: PORTABLE CHEST 1 VIEW COMPARISON:  Portable chest x-ray of 11/05/2015 and 11/02/2015 FINDINGS: There is little change in probable interstitial edema pattern with small effusions and cardiomegaly. The tip of the endotracheal tube is only 1.4 cm above the carina. Right IJ central venous line overlies the lower SVC and and NG tube  coils in the stomach. Mild cardiomegaly is stable. IMPRESSION: 1. Little change in interstitial edema pattern. 2. Tip of endotracheal tube only 1.4 cm above the carina. Electronically Signed   By: Dwyane Dee M.D.   On: 11/07/2015 08:13   Dg Chest Port 1 View  11/05/2015  CLINICAL DATA:  Respiratory failure. EXAM: PORTABLE CHEST 1 VIEW COMPARISON:  11/07/2015 FINDINGS: Endotracheal tube, right central line and NG tube remain in place, unchanged. Prior CABG. Heart is mildly enlarged with vascular congestion. Interstitial prominence may reflect interstitial edema, improving since prior study. No confluent opacities or effusions. IMPRESSION: Improving interstitial edema pattern. Support devices are stable. Electronically Signed   By: Charlett Nose M.D.   On: 11/05/2015 11:06   Dg Chest Portable 1 View  11/21/2015  CLINICAL DATA:  Acute respiratory failure.  Central line placement. EXAM: PORTABLE CHEST 1 VIEW COMPARISON:  11/12/2015 FINDINGS: The endotracheal tube and NG tubes are stable. New right IJ central venous catheter tip is in the distal SVC near the cavoatrial junction. No complicating features. Persistent cardiac enlargement and diffuse pulmonary edema. IMPRESSION: Right IJ center venous catheter tip is in the distal SVC. The endotracheal tube and NG tubes are stable. Persistent pulmonary edema. Electronically Signed   By: Orlene Plum.D.  On: Nov 06, 2015 17:39   Dg Chest Portable 1 View  11-06-2015  CLINICAL DATA:  Sudden shortness of breath, diaphoretic. EXAM: PORTABLE CHEST 1 VIEW COMPARISON:  Chest x-rays dated 09/12/2015 and 08/22/2013. FINDINGS: Cardiomegaly is stable. Median sternotomy wires are stable in alignment. Endotracheal tube appears well positioned with tip just above the level of the carina. Enteric tube passes below the diaphragm. There is central pulmonary vascular congestion and bilateral interstitial edema suggesting volume overload/ CHF. No pleural effusion or pneumothorax seen.  IMPRESSION: 1. Cardiomegaly with central pulmonary vascular congestion and bilateral interstitial edema indicating CHF/volume overload. 2. Endotracheal tube well positioned with tip approximately 2 cm above the level of the carina. Electronically Signed   By: Bary Richard M.D.   On: Nov 06, 2015 15:57      ASSESSMENT: [place]:    NSTEMI/PEA arrest: no report of chest pain. He is able to answer questions when sedation is lightened.  Would only consider cath if renal function was stable and he tolerated extubation well.   AFib: rate controlled.   Acute on chronic systolic heart failure: Doing well on vent, extubation limited by agitation.  Possible one way extubation tomorrow.  CRI: Slightly increased Cr today.  COntinue to follow.  Corky Crafts, MD  11/11/2015  9:48 AM

## 2015-11-11 NOTE — Progress Notes (Signed)
MD notified of pt temp 101.3. Cultures and tylenol ordered. BP dropping when seroquel and clonopin given. Will administer seroquel first and monitor BP. CBG 316. Increased dose of lantus.

## 2015-11-11 NOTE — Care Management Important Message (Signed)
Important Message  Patient Details  Name: Randy Nunez MRN: [id number] Date of Birth: 1941/05/22   Medicare Important Message Given:  Yes    Kyla Balzarine 11/11/2015, 12:07 PM

## 2015-11-11 NOTE — Progress Notes (Signed)
PULMONARY / CRITICAL CARE MEDICINE   Name: Randy Nunez MRN: [id number] DOB: February 19, 1941    ADMISSION DATE:  11/22/2015  CHIEF COMPLAINT:  Respiratory failure and cardiac arrest  HISTORY OF PRESENT ILLNESS:   Randy Nunez is a 75 y.o. male with diabetes mellitus, hypertenstion, CAD s/p CABG, chronic atrial fibrillation and HFrEF who presented to outside emergency department with shortness of breath 4/2.  Had subsequent brief PEA arrest with ROSC after intubation.  He was then transferred to [facility] ICU.  Picture c/w cardiogenic pulmonary edema.    SUBJECTIVE:  Much more awake and interactive today.  VITAL SIGNS: BP 96/50 mmHg  Pulse 75  Temp(Src) 100.3 F (37.9 C) (Oral)  Resp 19  Ht '5\' 11"'$  (1.803 m)  Wt 93.2 kg (205 lb 7.5 oz)  BMI 28.67 kg/m2  SpO2 88%  HEMODYNAMICS:    VENTILATOR SETTINGS: Vent Mode:  [-] PRVC FiO2 (%):  [40 %-50 %] 50 % Set Rate:  [16 bmp] 16 bmp Vt Set:  [550 mL] 550 mL PEEP:  [5 cmH20] 5 cmH20 Pressure Support:  [10 cmH20] 10 cmH20 Plateau Pressure:  [15 cmH20-21 cmH20] 19 cmH20  INTAKE / OUTPUT: I/O last 3 completed shifts: In: 4286.3 [I.V.:1936.3; NG/GT:2350] Out: 4630 [Urine:4630]  PHYSICAL EXAMINATION: General: chronically ill appearing male, NAD  Neuro: sedated, RASS -2, very agitated this am per RN HEENT: Moist mucous membranes, ETT in place Cardiovascular: Irregularly irregular, no murmurs, previous sternotomy scar noted, afib 90's Lungs: resps even, non labored on vent, mild tachypnea, faint bibasilar crackles  Abdomen: Obese, soft, nontender, no appreciable hepatosplenomegaly Musculoskeletal: Warm extremities, scant BLE edema Skin: Hyperpigmented skin in feet  LABS:  BMET  Recent Labs Lab 11/09/15 0409 11/10/15 0300 11/11/15 0212  NA 146* 147* 154*  K 3.8 3.9 3.7  CL 105 108 113*  CO2 '29 29 30  '$ BUN 47* 51* 57*  CREATININE 1.35* 1.34* 1.53*  GLUCOSE 330* 352* 169*   Electrolytes  Recent Labs Lab  11/06/15 0420  11/08/15 0449 11/09/15 0409 11/10/15 0300 11/11/15 0212  CALCIUM 8.5*  < > 9.0 8.7* 8.8* 8.8*  MG 1.9  < > 2.2 2.1  --  2.3  PHOS 3.0  --  4.7*  --   --   --   < > = values in this interval not displayed.  CBC  Recent Labs Lab 11/09/15 0409 11/10/15 0300 11/11/15 0212  WBC 11.3* 11.1* 10.5  HGB 12.3* 11.8* 11.8*  HCT 39.5 38.1* 38.2*  PLT 210 201 223   Coag's  Recent Labs Lab 11/06/15 0420 11/07/15 0423 11/08/15 0449  APTT 78* 78* 74*   Sepsis Markers No results for input(s): LATICACIDVEN, PROCALCITON, O2SATVEN in the last 168 hours.  ABG No results for input(s): PHART, PCO2ART, PO2ART in the last 168 hours.  Liver Enzymes No results for input(s): AST, ALT, ALKPHOS, BILITOT, ALBUMIN in the last 168 hours.  Cardiac Enzymes  Recent Labs Lab 11/05/15 0857  TROPONINI 1.66*   Glucose  Recent Labs Lab 11/11/15 0203 11/11/15 0320 11/11/15 0426 11/11/15 0532 11/11/15 0638 11/11/15 0734  GLUCAP 142* 143* 150* 136* 137* 157*   Imaging Dg Chest Port 1 View  11/11/2015  CLINICAL DATA:  Acute onset of respiratory failure. Initial encounter. EXAM: PORTABLE CHEST 1 VIEW COMPARISON:  Chest radiograph performed 11/09/2015 FINDINGS: The patient's endotracheal tube is seen ending 3 cm above the carina. An enteric tube is noted extending below the diaphragm. A right IJ line is noted ending about the  distal SVC. Mild bibasilar opacities, left greater than right, may reflect mild interstitial edema or possibly pneumonia. No definite pleural effusion or pneumothorax is seen. The cardiomediastinal silhouette is mildly enlarged. The patient is status post median sternotomy. No acute osseous abnormalities are seen. IMPRESSION: 1. Endotracheal tube seen ending 3 cm above the carina. 2. Mild bibasilar opacities, left greater than right, may reflect mild interstitial edema or possibly pneumonia. 3. Mild cardiomegaly. Electronically Signed   By: Garald Balding M.D.    On: 11/11/2015 01:57   STUDIES:   CULTURES: Resp 4/2 >> normal flora Blood 4/2 >> neg Urine 4/2 >> negative  ANTIBIOTICS: vanco 4/2 >> 4/3 Aztreonam 4/2 >> 4/3 levaquin 4/2 >> 4/5  SIGNIFICANT EVENTS:  LINES/TUBES: ETT 4/2 >>  Right Internal jugular CVC 4/2 >>  Foley catheter 4/2 >>  OG tube 4/2 >>   DISCUSSION: Randy Nunez is a 75 y.o. male with diabetes mellitus, hypertenstion, CAD s/p CABG, chronic atrial fibrillation and HFrEF who presented to the emergency department with shortness of breath 4/2 with subsequent PEA cardiac arrest and intubation. Clinical picture consistent with cardiogenic edema.   PULMONARY A: Acute hypoxemic respiratory failure Acute cardiogenic pulmonary edema Possible HCAP, doubt P:   PRVC 8cc/kg. Volume removal as below. PS as tolerated, no extubation today. Control agitation - see neuro. Goal net NEG.  CARDIOVASCULAR A:  Acute on chronic systolic congestive heart failure exacerbation Hx Afib  PEA arrest (likely respiratory etiology) Chronic atrial fibrillation (narrow complex) , tachycardic NSTEMI, ? Stress-related  Intermittent LBBB P:  Off amio  ASA, heparin gtt, statin  Change lasix to '40mg'$  BID = watch Na  Cardiology following - no plans cath   RENAL A; Acute on chronic kidney failure Hypokalemia  Hypernatremia  P:   Replace electrolytes as indicated Follow BMP Lasix 40 mg IV q6 x3 doses. Watch hypernatremia with diuresis  Free water 250 ml q6. D5W 50 ml/hr x20 hours.  GASTROINTESTINAL A:   SUP P:   PPI. TF.  HEMATOLOGIC A:   anticoag for A fib and ACS P:  Heparin gtt F/u cbc   INFECTIOUS A:   Possible HCAP P:   S/o course levaquin   ENDOCRINE A: DM P:   Insulin gtt per protocol.  NEUROLOGIC A:   S/p Cardiac arrest Sedation for MV Agitation, delirium  P:   RASS goal: -1 Given short duration, fact that he did not require ACLS, deferred hypothermia Continue precedex gtt  Add enteral  seroquel, low dose clonazepam and wean off fent gtt  Daily WUA   FAMILY  - Updates: Sisters bedside, informed that we will diurese today and will continue weaning efforts with anticipation of extubation in AM.  - Inter-disciplinary family meeting performed with Colletta Maryland, RN 11/08/15 >> Dr Lamonte Sakai met with 2 sisters and his niece, reviewed pt's underlying problems, his current status and his prognosis. They understand that he has been stable for last few days but not in a place where he can be extubated. We agreed to continue medical management, efforts at diuresis through the weekend. If progress made then will discuss potentially extending his care further with goal for extubation. If no progress through the weekend then we will discuss possible withdrawal of MV and transition to palliation. In the meantime we did agree that he would not benefit from CPR should he arrest or become unstable. I will place DNR orders on the chart. His sisters will update his daughters about his status and these plans. We  will include them in any decision making regarding withdrawal vs continued support.   The patient is critically ill with multiple organ systems failure and requires high complexity decision making for assessment and support, frequent evaluation and titration of therapies, application of advanced monitoring technologies and extensive interpretation of multiple databases.   Critical Care Time devoted to patient care services described in this note is  35  Minutes. This time reflects time of care of this signee Dr Jennet Maduro. This critical care time does not reflect procedure time, or teaching time or supervisory time of PA/NP/Med student/Med Resident etc but could involve care discussion time.  Rush Farmer, M.D. [facility] Pulmonary/Critical Care Medicine. Pager: [phone]. After hours pager: [phone].

## 2015-11-11 NOTE — Progress Notes (Signed)
eLink Physician-Brief Progress Note Patient Name: Randy Nunez DOB: Jan 27, 1941 MRN: [id number]   Date of Service  11/11/2015  HPI/Events of Note  Multiple issues: 1. Fever - Temp = 101.3 F. Already on Vancomycin and Zosyn. 2. Blood glucose = 314.  eICU Interventions  Will order: 1. Increase Lantus to 15 units Q 12 hours. 2. Blood Cultures X 2 now. 3. UA. 4. Tracheal Aspirate culture now.  5. Tylenol liquid 650 mg via tube Q 6 hours PRN Temp > 101.5 F.      Intervention Category Major Interventions: Infection - evaluation and management;Hyperglycemia - active titration of insulin therapy  Lenell Antu 11/11/2015, 8:37 PM

## 2015-11-11 NOTE — Progress Notes (Signed)
eLink Physician-Brief Progress Note Patient Name: DIONYSIUS CAMBRAY DOB: January 28, 1941 MRN: [id number]   Date of Service  11/11/2015  HPI/Events of Note  Hypoxia, hypotension, tachycardia.   eICU Interventions  Check CXR, ECG, BMET, CBC, Mg.  Will have RT increase FiO2 to keep SpO2 > 92%.     Intervention Category Major Interventions: Other:  Annalysa Mohammad 11/11/2015, 1:35 AM

## 2015-11-12 ENCOUNTER — Inpatient Hospital Stay (HOSPITAL_COMMUNITY): Payer: PPO

## 2015-11-12 LAB — GLUCOSE, CAPILLARY
GLUCOSE-CAPILLARY: 315 mg/dL — AB (ref 65–99)
GLUCOSE-CAPILLARY: 276 mg/dL — AB (ref 65–99)
GLUCOSE-CAPILLARY: 287 mg/dL — AB (ref 65–99)
GLUCOSE-CAPILLARY: 358 mg/dL — AB (ref 65–99)
Glucose-Capillary: 326 mg/dL — ABNORMAL HIGH (ref 65–99)
Glucose-Capillary: 331 mg/dL — ABNORMAL HIGH (ref 65–99)
Glucose-Capillary: 296 mg/dL — ABNORMAL HIGH (ref 65–99)

## 2015-11-12 LAB — BLOOD GAS, ARTERIAL
ACID-BASE EXCESS: 5.1 mmol/L — AB (ref 0.0–2.0)
Bicarbonate: 29.3 mEq/L — ABNORMAL HIGH (ref 20.0–24.0)
Drawn by: 41308
FIO2: 0.5
LHR: 16 {breaths}/min
MECHVT: 550 mL
O2 Saturation: 96.4 %
PEEP/CPAP: 5 cmH2O
Patient temperature: 98.6
TCO2: 30.7 mmol/L (ref 0–100)
pCO2 arterial: 45 mmHg (ref 35.0–45.0)
pH, Arterial: 7.43 (ref 7.350–7.450)
pO2, Arterial: 90.8 mmHg (ref 80.0–100.0)

## 2015-11-12 LAB — CBC
HCT: 37 % — ABNORMAL LOW (ref 39.0–52.0)
Hemoglobin: 11.2 g/dL — ABNORMAL LOW (ref 13.0–17.0)
MCH: 29.2 pg (ref 26.0–34.0)
MCHC: 30.3 g/dL (ref 30.0–36.0)
MCV: 96.4 fL (ref 78.0–100.0)
PLATELETS: 235 10*3/uL (ref 150–400)
RBC: 3.84 MIL/uL — AB (ref 4.22–5.81)
RDW: 14.8 % (ref 11.5–15.5)
WBC: 11.6 10*3/uL — AB (ref 4.0–10.5)

## 2015-11-12 LAB — URINE MICROSCOPIC-ADD ON

## 2015-11-12 LAB — BASIC METABOLIC PANEL
ANION GAP: 13 (ref 5–15)
BUN: 65 mg/dL — ABNORMAL HIGH (ref 6–20)
CO2: 29 mmol/L (ref 22–32)
Calcium: 8.6 mg/dL — ABNORMAL LOW (ref 8.9–10.3)
Chloride: 108 mmol/L (ref 101–111)
Creatinine, Ser: 1.6 mg/dL — ABNORMAL HIGH (ref 0.61–1.24)
GFR, EST AFRICAN AMERICAN: 47 mL/min — AB (ref >60)
GFR, EST NON AFRICAN AMERICAN: 40 mL/min — AB (ref >60)
Glucose, Bld: 378 mg/dL — ABNORMAL HIGH (ref 65–99)
POTASSIUM: 4 mmol/L (ref 3.5–5.1)
SODIUM: 150 mmol/L — AB (ref 135–145)

## 2015-11-12 LAB — URINALYSIS, ROUTINE W REFLEX MICROSCOPIC
BILIRUBIN URINE: NEGATIVE
Glucose, UA: >1000 mg/dL — AB
KETONES UR: NEGATIVE mg/dL
NITRITE: NEGATIVE
PH: 5 (ref 5.0–8.0)
Protein, ur: NEGATIVE mg/dL
Specific Gravity, Urine: 1.021 (ref 1.005–1.030)

## 2015-11-12 LAB — MAGNESIUM: MAGNESIUM: 2.3 mg/dL (ref 1.7–2.4)

## 2015-11-12 LAB — PHOSPHORUS: Phosphorus: 4.3 mg/dL (ref 2.5–4.6)

## 2015-11-12 LAB — HEPARIN LEVEL (UNFRACTIONATED): HEPARIN UNFRACTIONATED: 0.63 [IU]/mL (ref 0.30–0.70)

## 2015-11-12 MED ORDER — FUROSEMIDE 10 MG/ML IJ SOLN
10.0000 mg/h | INTRAVENOUS | Status: DC
  Administered 2015-11-12: 10 mg/h via INTRAVENOUS
  Filled 2015-11-12: qty 25

## 2015-11-12 MED ORDER — METOLAZONE 5 MG PO TABS
10.0000 mg | ORAL_TABLET | Freq: Every day | ORAL | Status: AC
  Administered 2015-11-12: 10 mg via ORAL
  Filled 2015-11-12: qty 2

## 2015-11-12 MED ORDER — DEXTROSE 5 % IV SOLN
INTRAVENOUS | Status: DC
  Administered 2015-11-12 – 2015-11-13 (×2): via INTRAVENOUS

## 2015-11-12 MED ORDER — CEFEPIME HCL 1 G IJ SOLR
1.0000 g | Freq: Three times a day (TID) | INTRAMUSCULAR | Status: DC
  Administered 2015-11-12 – 2015-11-13 (×4): 1 g via INTRAVENOUS
  Filled 2015-11-12 (×6): qty 1

## 2015-11-12 MED ORDER — VANCOMYCIN HCL IN DEXTROSE 750-5 MG/150ML-% IV SOLN
750.0000 mg | Freq: Two times a day (BID) | INTRAVENOUS | Status: DC
  Administered 2015-11-12 (×2): 750 mg via INTRAVENOUS
  Filled 2015-11-12 (×6): qty 150

## 2015-11-12 NOTE — Progress Notes (Signed)
Elink MD notified of positive blood culture in anaerobic bottle growing gram positive cocci in clusters.

## 2015-11-12 NOTE — Progress Notes (Signed)
PULMONARY / CRITICAL CARE MEDICINE   Name: Randy Nunez MRN: [id number] DOB: [birth year]    ADMISSION DATE:  11/29/15  CHIEF COMPLAINT:  Respiratory failure and cardiac arrest  HISTORY OF PRESENT ILLNESS:   Randy Nunez is a 75 y.o. male with diabetes mellitus, hypertenstion, CAD s/p CABG, chronic atrial fibrillation and HFrEF who presented to outside emergency department with shortness of breath 4/2.  Had subsequent brief PEA arrest with ROSC after intubation.  He was then transferred to [facility] ICU.  Picture c/w cardiogenic pulmonary edema.    SUBJECTIVE:  Febrile overnight, but weaning well.  VITAL SIGNS: BP 92/53 mmHg  Pulse 81  Temp(Src) 99 F (37.2 C) (Oral)  Resp 17  Ht 5\' 11"  (1.803 m)  Wt 92.4 kg (203 lb 11.3 oz)  BMI 28.42 kg/m2  SpO2 96%  HEMODYNAMICS:    VENTILATOR SETTINGS: Vent Mode:  [-] PRVC FiO2 (%):  [50 %] 50 % Set Rate:  [16 bmp] 16 bmp Vt Set:  [550 mL] 550 mL PEEP:  [5 cmH20] 5 cmH20 Plateau Pressure:  [16 cmH20-19 cmH20] 16 cmH20  INTAKE / OUTPUT: I/O last 3 completed shifts: In: 5819.4 [I.V.:2285.9; NG/GT:3533.5] Out: 6101 [Urine:6101]  PHYSICAL EXAMINATION: General: chronically ill appearing male, NAD  Neuro: sedated, RASS -2, very agitated this am per RN HEENT: Moist mucous membranes, ETT in place Cardiovascular: Irregularly irregular, no murmurs, previous sternotomy scar noted, afib 90's Lungs: resps even, non labored on vent, mild tachypnea, faint bibasilar crackles  Abdomen: Obese, soft, nontender, no appreciable hepatosplenomegaly Musculoskeletal: Warm extremities, scant BLE edema Skin: Hyperpigmented skin in feet  LABS:  BMET  Recent Labs Lab 11/10/15 0300 11/11/15 0212 11/12/15 0332  NA 147* 154* 150*  K 3.9 3.7 4.0  CL 108 113* 108  CO2 29 30 29   BUN 51* 57* 65*  CREATININE 1.34* 1.53* 1.60*  GLUCOSE 352* 169* 378*   Electrolytes  Recent Labs Lab 11/06/15 0420  11/08/15 0449 11/09/15 0409  11/10/15 0300 11/11/15 0212 11/12/15 0332  CALCIUM 8.5*  < > 9.0 8.7* 8.8* 8.8* 8.6*  MG 1.9  < > 2.2 2.1  --  2.3 2.3  PHOS 3.0  --  4.7*  --   --   --  4.3  < > = values in this interval not displayed.  CBC  Recent Labs Lab 11/10/15 0300 11/11/15 0212 11/12/15 0332  WBC 11.1* 10.5 11.6*  HGB 11.8* 11.8* 11.2*  HCT 38.1* 38.2* 37.0*  PLT 201 223 235   Coag's  Recent Labs Lab 11/06/15 0420 11/07/15 0423 11/08/15 0449  APTT 78* 78* 74*   Sepsis Markers No results for input(s): LATICACIDVEN, PROCALCITON, O2SATVEN in the last 168 hours.  ABG  Recent Labs Lab 11/12/15 0325  PHART 7.430  PCO2ART 45.0  PO2ART 90.8    Liver Enzymes No results for input(s): AST, ALT, ALKPHOS, BILITOT, ALBUMIN in the last 168 hours.  Cardiac Enzymes No results for input(s): TROPONINI, PROBNP in the last 168 hours. Glucose  Recent Labs Lab 11/11/15 1225 11/11/15 1526 11/11/15 1936 11/12/15 0126 11/12/15 0411 11/12/15 0832  GLUCAP 179* 242* 316* 315* 276* 287*   Imaging Dg Chest Port 1 View  11/12/2015  CLINICAL DATA:  Intubated EXAM: PORTABLE CHEST 1 VIEW COMPARISON:  11/11/2015 FINDINGS: Prior CABG. Cardiomegaly. Support devices are stable. Increasing left lower lobe atelectasis or infiltrate. Possible small left pleural effusion. Minimal right base atelectasis. IMPRESSION: Increasing left basilar atelectasis or infiltrate. Possible small left effusion. Minimal right base atelectasis.  Electronically Signed   By: Charlett Nose M.D.   On: 11/12/2015 07:17   STUDIES:   CULTURES: Resp 4/2 >> normal flora Blood 4/2 >> neg Urine 4/2 >> negative Blood 4/10>>> Sputum 4/11>>>  ANTIBIOTICS: vanco 4/2 >> 4/3>>>4/11>>> Aztreonam 4/2 >> 4/3 levaquin 4/2 >> 4/5 Cefepime 4/11>>>  SIGNIFICANT EVENTS:  LINES/TUBES: ETT 4/2 >>  Right Internal jugular CVC 4/2 >>  Foley catheter 4/2 >>  OG tube 4/2 >>   DISCUSSION: Randy Nunez is a 75 y.o. male with diabetes mellitus,  hypertenstion, CAD s/p CABG, chronic atrial fibrillation and HFrEF who presented to the emergency department with shortness of breath 4/2 with subsequent PEA cardiac arrest and intubation. Clinical picture consistent with cardiogenic edema.   PULMONARY A: Acute hypoxemic respiratory failure Acute cardiogenic pulmonary edema Possible HCAP, doubt P:   PRVC 8cc/kg. Volume removal as below. PS as tolerated, no extubation today. Control agitation - see neuro. Goal net NEG.  CARDIOVASCULAR A:  Acute on chronic systolic congestive heart failure exacerbation Hx Afib  PEA arrest (likely respiratory etiology) Chronic atrial fibrillation (narrow complex) , tachycardic NSTEMI, ? Stress-related  Intermittent LBBB P:  Off amio  ASA, heparin gtt, statin  Lasix drip as below.  Cardiology following - no plans cath   RENAL A; Acute on chronic kidney failure Hypokalemia  Hypernatremia  P:   Replace electrolytes as indicated Follow BMP Lasix 10 mg/hr x24 hours. Watch hypernatremia with diuresis  Free water 250 ml q6. D5W 50 ml/hr x20 hours. Zaroxolyn 10 mg PO x1.  GASTROINTESTINAL A:   SUP P:   PPI. TF.  HEMATOLOGIC A:   anticoag for A fib and ACS P:  Heparin gtt. F/u cbc.  INFECTIOUS A:   Possible HCAP P:   S/o course levaquin   ENDOCRINE A: DM P:   Insulin gtt per protocol.  NEUROLOGIC A:   S/p Cardiac arrest Sedation for MV Agitation, delirium  P:   RASS goal: -1 Given short duration, fact that he did not require ACLS, deferred hypothermia Continue precedex gtt  Add enteral seroquel, low dose clonazepam and wean off fent gtt  Daily WUA   FAMILY  - Updates: Sisters bedside, informed that we will diurese today and will continue weaning efforts with anticipation of extubation in AM.  - Spoke with both daughters today, patient is weaning relatively ok but O2 demand remains high.  After discussion with family, no trach/peg, will attempt a lasix drip to see  if diureses can be achieved with the purpose of allowing some time for patient to spend with family, as if we extubate now will need a morphine drip.  Spoke with patient and he elected to give it another day.  The patient is critically ill with multiple organ systems failure and requires high complexity decision making for assessment and support, frequent evaluation and titration of therapies, application of advanced monitoring technologies and extensive interpretation of multiple databases.   Critical Care Time devoted to patient care services described in this note is  35  Minutes. This time reflects time of care of this signee Dr Koren Bound. This critical care time does not reflect procedure time, or teaching time or supervisory time of PA/NP/Med student/Med Resident etc but could involve care discussion time.  Alyson Reedy, M.D. [facility] Pulmonary/Critical Care Medicine. Pager: [phone]. After hours pager: [phone].

## 2015-11-12 NOTE — Progress Notes (Signed)
ANTICOAGULATION CONSULT NOTE  Pharmacy Consult for Heparin (while holding apixaban) Indication: chest pain/ACS and atrial fibrillation  Patient Measurements: Height: 5\' 11"  (180.3 cm) Weight: 203 lb 11.3 oz (92.4 kg) IBW/kg (Calculated) : 75.3  Vital Signs: Temp: 99.3 F (37.4 C) (04/11 0400) Temp Source: Oral (04/11 0400) BP: 92/53 mmHg (04/11 0720) Pulse Rate: 81 (04/11 0720)  Labs:  Recent Labs  11/10/15 0300 11/10/15 0308 11/11/15 0212 11/11/15 0320 11/12/15 0332  HGB 11.8*  --  11.8*  --  11.2*  HCT 38.1*  --  38.2*  --  37.0*  PLT 201  --  223  --  235  HEPARINUNFRC  --  0.34  --  0.29* 0.63  CREATININE 1.34*  --  1.53*  --  1.60*    Estimated Creatinine Clearance: 45.6 mL/min (by C-G formula based on Cr of 1.6).  Marland Kitchen dexmedetomidine 0.3 mcg/kg/hr (11/12/15 0408)  . feeding supplement (VITAL AF 1.2 CAL) 1,000 mL (11/12/15 0242)  . fentaNYL infusion INTRAVENOUS 75 mcg/hr (11/12/15 0412)  . heparin 1,250 Units/hr (11/12/15 0242)     Assessment: 75 y/o M on apixaban PTA for afib, transfer from [facility] after brief cardiac arrest. He is on IV heparin for NSTEMI/afib (apixaban is on hold). Heparin level therapeutic on heparin drip 1250 uts/hr. No issues with line or bleeding reported per RN. CBC stable.  Goal of Therapy:  Heparin level 0.3-0.7 units/ml aPTT 66-102 seconds Monitor platelets by anticoagulation protocol: Yes   Plan:  -Continue IV heparin at current rate. -Daily heparin level and CBC. -F/u plans for extubation/need for oral anticoagulation  Tad Moore, BCPS  Clinical Pharmacist Pager [phone]  11/12/2015 8:19 AM

## 2015-11-12 NOTE — Progress Notes (Signed)
SUBJECTIVE:  Increased agitation while sedation weaned today.  CCM likely to extubate today or tomorrow without plan for reintubation  OBJECTIVE:   Vitals:   Filed Vitals:   11/12/15 0500 11/12/15 0600 11/12/15 0720 11/12/15 0833  BP: 92/56 98/57 92/53    Pulse: 76 75 81   Temp:    99 F (37.2 C)  TempSrc:    Oral  Resp: 16 16 17    Height:      Weight:      SpO2: 96% 97% 96%    I&O's:    Intake/Output Summary (Last 24 hours) at 11/12/15 1028 Last data filed at 11/12/15 0600  Gross per 24 hour  Intake 3679.22 ml  Output   3721 ml  Net -41.78 ml   TELEMETRY: Reviewed telemetry pt in AFib:     PHYSICAL EXAM General: Intubated , sedated Head:   Normal cephalic and atramatic  Lungs:   Clear bilaterally to auscultation. Heart:   Irregularly irregular S1 S2  No JVD.   Abdomen: abdomen soft and non-tender Msk:  Back normal,  Normal strength and tone for age. Extremities:  Tr leg edema.   Neuro: Alert per the nurse, and able to follow commands when sedation is lightened Psych:  Intubated, sedated Skin: No rash   LABS: Basic Metabolic Panel:  Recent Labs  16/10/96 0212 11/12/15 0332  NA 154* 150*  K 3.7 4.0  CL 113* 108  CO2 30 29  GLUCOSE 169* 378*  BUN 57* 65*  CREATININE 1.53* 1.60*  CALCIUM 8.8* 8.6*  MG 2.3 2.3  PHOS  --  4.3   Liver Function Tests: No results for input(s): AST, ALT, ALKPHOS, BILITOT, PROT, ALBUMIN in the last 72 hours. No results for input(s): LIPASE, AMYLASE in the last 72 hours. CBC:  Recent Labs  11/11/15 0212 11/12/15 0332  WBC 10.5 11.6*  HGB 11.8* 11.2*  HCT 38.2* 37.0*  MCV 95.5 96.4  PLT 223 235   Cardiac Enzymes: No results for input(s): CKTOTAL, CKMB, CKMBINDEX, TROPONINI in the last 72 hours. BNP: Invalid input(s): POCBNP D-Dimer: No results for input(s): DDIMER in the last 72 hours. Hemoglobin A1C: No results for input(s): HGBA1C in the last 72 hours. Fasting Lipid Panel: No results for input(s): CHOL,  HDL, LDLCALC, TRIG, CHOLHDL, LDLDIRECT in the last 72 hours. Thyroid Function Tests: No results for input(s): TSH, T4TOTAL, T3FREE, THYROIDAB in the last 72 hours.  Invalid input(s): FREET3 Anemia Panel: No results for input(s): VITAMINB12, FOLATE, FERRITIN, TIBC, IRON, RETICCTPCT in the last 72 hours. Coag Panel:   Lab Results  Component Value Date   INR 1.22 10-Nov-2015   INR 1.30 09/12/2015   INR 1.15 08/29/2015    RADIOLOGY: Dg Chest Port 1 View  11/12/2015  CLINICAL DATA:  Intubated EXAM: PORTABLE CHEST 1 VIEW COMPARISON:  11/11/2015 FINDINGS: Prior CABG. Cardiomegaly. Support devices are stable. Increasing left lower lobe atelectasis or infiltrate. Possible small left pleural effusion. Minimal right base atelectasis. IMPRESSION: Increasing left basilar atelectasis or infiltrate. Possible small left effusion. Minimal right base atelectasis. Electronically Signed   By: Charlett Nose M.D.   On: 11/12/2015 07:17   Dg Chest Port 1 View  11/11/2015  CLINICAL DATA:  Acute onset of respiratory failure. Initial encounter. EXAM: PORTABLE CHEST 1 VIEW COMPARISON:  Chest radiograph performed 11/09/2015 FINDINGS: The patient's endotracheal tube is seen ending 3 cm above the carina. An enteric tube is noted extending below the diaphragm. A right IJ line is noted ending about the distal  SVC. Mild bibasilar opacities, left greater than right, may reflect mild interstitial edema or possibly pneumonia. No definite pleural effusion or pneumothorax is seen. The cardiomediastinal silhouette is mildly enlarged. The patient is status post median sternotomy. No acute osseous abnormalities are seen. IMPRESSION: 1. Endotracheal tube seen ending 3 cm above the carina. 2. Mild bibasilar opacities, left greater than right, may reflect mild interstitial edema or possibly pneumonia. 3. Mild cardiomegaly. Electronically Signed   By: Roanna Raider M.D.   On: 11/11/2015 01:57   Dg Chest Port 1 View  11/09/2015  CLINICAL  DATA:  75 year old male with a history of acute respiratory failure. EXAM: PORTABLE CHEST 1 VIEW COMPARISON:  11/08/2015, 11/07/2015 FINDINGS: Cardiomediastinal silhouette unchanged in size and contour. Surgical changes of median sternotomy and CABG. Endotracheal tube appears to terminate approximately 5.4 cm above the carina. Gastric tube terminates out of the field of view. Right IJ approach central venous catheter appears to terminate in the superior vena cava. Similar appearance of mixed interstitial and airspace opacities bilateral lungs. No pneumothorax. No displaced fracture. IMPRESSION: Similar appearance of the chest x-ray with persisting mixed interstitial and airspace opacity, potentially a combination of atelectasis, edema, and/ or consolidation. No large pleural effusion. Endotracheal tube has been slightly withdrawn, and terminates suitably above the carina, measuring approximately 5.5 cm. Otherwise unchanged support apparatus. Signed, Yvone Neu. Loreta Ave, DO [facility] Radiology Electronically Signed   By: Gilmer Mor D.O.   On: 11/09/2015 07:08   Dg Chest Port 1 View  11/08/2015  CLINICAL DATA:  Acute respiratory failure EXAM: PORTABLE CHEST 1 VIEW COMPARISON:  11/07/2015; 11/05/2015; 09/12/2015; 08/29/2015 FINDINGS: Grossly unchanged enlarged cardiac silhouette and mediastinal contours post median sternotomy and CABG. Stable position of support apparatus. Pulmonary vasculature remains indistinct with cephalization of flow. Bilateral mid and lower lung heterogeneous opacities, right greater than left, are grossly unchanged. No new focal airspace opacities. Unchanged suspected trace bilateral effusions. No pneumothorax. Unchanged bones. IMPRESSION: 1.  Stable positioning of support apparatus.  No pneumothorax. 2. Similar findings of pulmonary edema and bibasilar opacities, right greater than left, atelectasis versus infiltrate. Electronically Signed    By: Simonne Come M.D.   On: 11/08/2015 07:55   Dg Chest Port 1 View  11/07/2015  CLINICAL DATA:  Ventilator dependent respiratory failure EXAM: PORTABLE CHEST 1 VIEW COMPARISON:  Portable chest x-ray of 11/05/2015 and 11-04-15 FINDINGS: There is little change in probable interstitial edema pattern with small effusions and cardiomegaly. The tip of the endotracheal tube is only 1.4 cm above the carina. Right IJ central venous line overlies the lower SVC and and NG tube coils in the stomach. Mild cardiomegaly is stable. IMPRESSION: 1. Little change in interstitial edema pattern. 2. Tip of endotracheal tube only 1.4 cm above the carina. Electronically Signed   By: Dwyane Dee M.D.   On: 11/07/2015 08:13   Dg Chest Port 1 View  11/05/2015  CLINICAL DATA:  Respiratory failure. EXAM: PORTABLE CHEST 1 VIEW COMPARISON:  November 04, 2015 FINDINGS: Endotracheal tube, right central line and NG tube remain in place, unchanged. Prior CABG. Heart is mildly enlarged with vascular congestion. Interstitial prominence may reflect interstitial edema, improving since prior study. No confluent opacities or effusions. IMPRESSION: Improving interstitial edema pattern. Support devices are stable. Electronically Signed   By: Charlett Nose M.D.   On: 11/05/2015 11:06   Dg Chest Portable 1 View  11-04-15  CLINICAL DATA:  Acute respiratory failure.  Central line placement. EXAM: PORTABLE CHEST 1  VIEW COMPARISON:  11/12/2015 FINDINGS: The endotracheal tube and NG tubes are stable. New right IJ central venous catheter tip is in the distal SVC near the cavoatrial junction. No complicating features. Persistent cardiac enlargement and diffuse pulmonary edema. IMPRESSION: Right IJ center venous catheter tip is in the distal SVC. The endotracheal tube and NG tubes are stable. Persistent pulmonary edema. Electronically Signed   By: Rudie Meyer M.D.   On: 11/24/2015 17:39   Dg Chest Portable 1 View  12/01/2015  CLINICAL DATA:  Sudden shortness of  breath, diaphoretic. EXAM: PORTABLE CHEST 1 VIEW COMPARISON:  Chest x-rays dated 09/12/2015 and 08/22/2013. FINDINGS: Cardiomegaly is stable. Median sternotomy wires are stable in alignment. Endotracheal tube appears well positioned with tip just above the level of the carina. Enteric tube passes below the diaphragm. There is central pulmonary vascular congestion and bilateral interstitial edema suggesting volume overload/ CHF. No pleural effusion or pneumothorax seen. IMPRESSION: 1. Cardiomegaly with central pulmonary vascular congestion and bilateral interstitial edema indicating CHF/volume overload. 2. Endotracheal tube well positioned with tip approximately 2 cm above the level of the carina. Electronically Signed   By: Bary Richard M.D.   On: 11/28/2015 15:57      ASSESSMENT: Roosvelt Harps:    NSTEMI/PEA arrest: no report of chest pain. He is able to answer questions when sedation is lightened.  Would only consider cath if renal function was stable and he tolerated extubation well.  No plans for cath at this time. BP stable.   AFib: rate controlled.   Acute on chronic systolic heart failure: Doing well on vent, extubation limited by agitation.  Possible one way extubation in the next few days.  CRI: Slightly increased Cr again today.  COntinue to follow.  Corky Crafts, MD  11/12/2015  10:28 AM

## 2015-11-12 NOTE — Progress Notes (Signed)
Inpatient Diabetes Program Recommendations  AACE/ADA: New Consensus Statement on Inpatient Glycemic Control (2015)  Target Ranges:  Prepandial:   less than 140 mg/dL      Peak postprandial:   less than 180 mg/dL (1-2 hours)      Critically ill patients:  140 - 180 mg/dL   Review of Glycemic Control:  Results for Randy Nunez, Randy Nunez (MRN [id number]) as of 11/12/2015 10:13  Ref. Range 11/11/2015 15:26 11/11/2015 19:36 11/12/2015 01:26 11/12/2015 04:11 11/12/2015 08:32  Glucose-Capillary Latest Ref Range: 65-99 mg/dL 244 (H) 975 (H) 300 (H) 276 (H) 287 (H)   Diabetes history: Type 2 diabetes Outpatient Diabetes medications: Levemir 20 units daily, Novolog 10 units tid with mels Current orders for Inpatient glycemic control:  Lantus 15 units bid, Novolog resistant q 4 hours  Inpatient Diabetes Program Recommendations:    Note that patient transitioned off insulin drip yesterday and drip rate > 5 units/hr.  Note that based on insulin drip, patient likely needs approximately 96 units daily to control blood sugars.  Please consider increasing Lantus to 20 units bid and add Novolog meal coverage 5 units q 4 hours.    Will follow.  Thanks, Beryl Meager, RN, BC-ADM Inpatient Diabetes Coordinator Pager [phone] (8a-5p)

## 2015-11-12 NOTE — Progress Notes (Signed)
175cc fentanyl drip (10 mcg/mL) wasted in sink witnessed by 2RNs.

## 2015-11-12 NOTE — Progress Notes (Signed)
Pharmacy Antibiotic Note  Randy Nunez is a 75 y.o. male admitted on 11/28/2015 with pneumonia.  Pharmacy has been consulted for vancomycin and cefepime dosing.  Previously on vancomycin and aztreonam, then antibiotics D/C'd.  Spiked fever last night, and now pharmacy asked to restart antibiotics.  Scr 1.6, est CrCl ~ 45 ml/min  Plan: Vancomycin 750 mg IV every 12 hours.  Goal trough 15-20 mcg/mL.  Cefepime 1g IV q 8 hrs. F/u cultures, renal function and clinical course. Vancomycin trough level at steady state as indicated.  Height: 5\' 11"  (180.3 cm) Weight: 203 lb 11.3 oz (92.4 kg) IBW/kg (Calculated) : 75.3  Temp (24hrs), Avg:99.9 F (37.7 C), Min:99 F (37.2 C), Max:101.3 F (38.5 C)   Recent Labs Lab 11/08/15 0449 11/09/15 0409 11/10/15 0300 11/11/15 0212 11/12/15 0332  WBC 9.8 11.3* 11.1* 10.5 11.6*  CREATININE 1.44* 1.35* 1.34* 1.53* 1.60*    Estimated Creatinine Clearance: 45.6 mL/min (by C-G formula based on Cr of 1.6).    Allergies  Allergen Reactions  . Penicillins     Has patient had a PCN reaction causing immediate rash, facial/tongue/throat swelling, SOB or lightheadedness with hypotension: unknown Has patient had a PCN reaction causing severe rash involving mucus membranes or skin necrosis:unknown Has patient had a PCN reaction that required hospitalization no Has patient had a PCN reaction occurring within the last 10 years: no If all of the above answers are "NO", then may proceed with Ceph    Antimicrobials this admission: Vanc 4/2>> 4/3; Resume 4/11 > Cefepime 4/11 >   Levaquin 4/2>> 4/5 Aztreo 4/2>> 4/3  Dose adjustments this admission: n/a  Microbiology results: 4/2 Blood - neg 4/2 resp- normal flora 4/2 MRSA PCR- neg 4/10 BCx x 2 > 4/10 Resp Cx >   Thank you for allowing pharmacy to be a part of this patient's care.  Tad Moore, BCPS  Clinical Pharmacist Pager [phone]  11/12/2015 9:48 AM

## 2015-11-13 ENCOUNTER — Inpatient Hospital Stay (HOSPITAL_COMMUNITY): Payer: PPO

## 2015-11-13 DIAGNOSIS — I481 Persistent atrial fibrillation: Secondary | ICD-10-CM

## 2015-11-13 LAB — BLOOD GAS, ARTERIAL
ACID-BASE EXCESS: 7.8 mmol/L — AB (ref 0.0–2.0)
Bicarbonate: 31.9 mEq/L — ABNORMAL HIGH (ref 20.0–24.0)
DRAWN BY: 41308
FIO2: 0.5
LHR: 16 {breaths}/min
MECHVT: 550 mL
O2 SAT: 93.9 %
PATIENT TEMPERATURE: 98.6
PCO2 ART: 45.8 mmHg — AB (ref 35.0–45.0)
PEEP/CPAP: 5 cmH2O
PH ART: 7.457 — AB (ref 7.350–7.450)
PO2 ART: 73.3 mmHg — AB (ref 80.0–100.0)
TCO2: 33.3 mmol/L (ref 0–100)

## 2015-11-13 LAB — BASIC METABOLIC PANEL
Anion gap: 12 (ref 5–15)
BUN: 71 mg/dL — AB (ref 6–20)
CALCIUM: 8.5 mg/dL — AB (ref 8.9–10.3)
CHLORIDE: 98 mmol/L — AB (ref 101–111)
CO2: 32 mmol/L (ref 22–32)
CREATININE: 1.78 mg/dL — AB (ref 0.61–1.24)
GFR calc non Af Amer: 35 mL/min — ABNORMAL LOW (ref >60)
GFR, EST AFRICAN AMERICAN: 41 mL/min — AB (ref >60)
Glucose, Bld: 381 mg/dL — ABNORMAL HIGH (ref 65–99)
Potassium: 3.6 mmol/L (ref 3.5–5.1)
Sodium: 142 mmol/L (ref 135–145)

## 2015-11-13 LAB — CBC
HEMATOCRIT: 36.2 % — AB (ref 39.0–52.0)
Hemoglobin: 11.3 g/dL — ABNORMAL LOW (ref 13.0–17.0)
MCH: 29.4 pg (ref 26.0–34.0)
MCHC: 31.2 g/dL (ref 30.0–36.0)
MCV: 94.3 fL (ref 78.0–100.0)
Platelets: 248 10*3/uL (ref 150–400)
RBC: 3.84 MIL/uL — ABNORMAL LOW (ref 4.22–5.81)
RDW: 14.4 % (ref 11.5–15.5)
WBC: 12.8 10*3/uL — ABNORMAL HIGH (ref 4.0–10.5)

## 2015-11-13 LAB — MAGNESIUM: Magnesium: 2.2 mg/dL (ref 1.7–2.4)

## 2015-11-13 LAB — GLUCOSE, CAPILLARY
GLUCOSE-CAPILLARY: 327 mg/dL — AB (ref 65–99)
GLUCOSE-CAPILLARY: 337 mg/dL — AB (ref 65–99)

## 2015-11-13 LAB — PHOSPHORUS: PHOSPHORUS: 5.1 mg/dL — AB (ref 2.5–4.6)

## 2015-11-13 LAB — HEPARIN LEVEL (UNFRACTIONATED): HEPARIN UNFRACTIONATED: 0.5 [IU]/mL (ref 0.30–0.70)

## 2015-11-13 MED ORDER — MORPHINE SULFATE 25 MG/ML IV SOLN
10.0000 mg/h | INTRAVENOUS | Status: DC
  Administered 2015-11-13: 10 mg/h via INTRAVENOUS
  Administered 2015-11-13: 20 mg/h via INTRAVENOUS
  Filled 2015-11-13 (×2): qty 10

## 2015-11-13 MED ORDER — DEXTROSE 5 % IV SOLN
2.0000 g | INTRAVENOUS | Status: DC
  Filled 2015-11-13: qty 2

## 2015-11-13 MED ORDER — METOLAZONE 5 MG PO TABS: 10.0000 mg | ORAL_TABLET | Freq: Every day | ORAL | Status: DC

## 2015-11-13 MED ORDER — FUROSEMIDE 10 MG/ML IJ SOLN
10.0000 mg/h | INTRAVENOUS | Status: AC
  Administered 2015-11-13: 10 mg/h via INTRAVENOUS
  Filled 2015-11-13 (×3): qty 25

## 2015-11-13 MED ORDER — MORPHINE BOLUS VIA INFUSION
5.0000 mg | INTRAVENOUS | Status: DC | PRN
  Administered 2015-11-13 (×2): 15 mg via INTRAVENOUS
  Administered 2015-11-13: 10 mg via INTRAVENOUS
  Administered 2015-11-13: 12.5 mg via INTRAVENOUS
  Filled 2015-11-13 (×5): qty 20

## 2015-11-13 NOTE — Progress Notes (Signed)
ANTICOAGULATION CONSULT NOTE  Pharmacy Consult for Heparin (while holding apixaban) Indication: chest pain/ACS and atrial fibrillation  Patient Measurements: Height: 5\' 11"  (180.3 cm) Weight: 204 lb 5.9 oz (92.7 kg) IBW/kg (Calculated) : 75.3  Vital Signs: Temp: 99 F (37.2 C) (04/12 0754) Temp Source: Oral (04/12 0754) BP: 100/52 mmHg (04/12 0800) Pulse Rate: 69 (04/12 0800)  Labs:  Recent Labs  11/11/15 0212 11/11/15 0320 11/12/15 0332 11/13/15 0315  HGB 11.8*  --  11.2* 11.3*  HCT 38.2*  --  37.0* 36.2*  PLT 223  --  235 248  HEPARINUNFRC  --  0.29* 0.63 0.50  CREATININE 1.53*  --  1.60* 1.78*    Estimated Creatinine Clearance: 41.1 mL/min (by C-G formula based on Cr of 1.78).  Marland Kitchen dextrose 50 mL/hr at 11/13/15 0800  . furosemide (LASIX) infusion 10 mg/hr (11/13/15 0900)  . heparin 1,250 Units/hr (11/13/15 0800)     Assessment: 75 y/o M on apixaban PTA for afib, transfer from [facility] after brief cardiac arrest. He is on IV heparin for NSTEMI/afib (apixaban is on hold). Heparin level therapeutic on heparin drip 1250 uts/hr. No issues with line or bleeding reported per RN. CBC stable.  Goal of Therapy:  Heparin level 0.3-0.7 units/ml aPTT 66-102 seconds Monitor platelets by anticoagulation protocol: Yes   Plan:  -Continue IV heparin at current rate. -Daily heparin level and CBC.  Thank you Okey Regal, PharmD [phone]   11/13/2015 9:07 AM

## 2015-11-13 NOTE — Progress Notes (Signed)
eLink Physician-Brief Progress Note Patient Name: Randy Nunez DOB: Oct 20, 1940 MRN: [id number]   Date of Service  11/13/2015  HPI/Events of Note  Blood cultures growing GPC in clusters in both bottles from 11/11/2015. No organism ID or Sensitivities are available at this time. Patient is currently on Vancomycin and Cefepime.   eICU Interventions  Vancomycin should provide adequate coverage for GPC in clusters.      Intervention Category Major Interventions: Infection - evaluation and management  [name] 11/13/2015, 6:05 AM

## 2015-11-13 NOTE — Progress Notes (Signed)
75 mg fentanyl wasted in sink with Bronson Curb RN

## 2015-11-13 NOTE — Progress Notes (Signed)
   11/13/15 1100  Clinical Encounter Type  Visited With Patient and family together  Visit Type Patient actively dying  Referral From Nurse  Spiritual Encounters  Spiritual Needs Prayer;Grief support  Stress Factors  Family Stress Factors Loss  Chaplain provided prayer with family and patient and offered comfort and support as father has been taken off life support and is dying of CHF. Bentlie Catanzaro, Chaplain

## 2015-11-13 NOTE — Progress Notes (Signed)
PULMONARY / CRITICAL CARE MEDICINE   Name: MCLEAN MOYA MRN: [id number] DOB: 17-Jul-1941    ADMISSION DATE:  11/12/2015  CHIEF COMPLAINT:  Respiratory failure and cardiac arrest  HISTORY OF PRESENT ILLNESS:   JAQUANE BOUGHNER is a 75 y.o. male with diabetes mellitus, hypertenstion, CAD s/p CABG, chronic atrial fibrillation and HFrEF who presented to outside emergency department with shortness of breath 4/2.  Had subsequent brief PEA arrest with ROSC after intubation.  He was then transferred to [facility] ICU.  Picture c/w cardiogenic pulmonary edema.    SUBJECTIVE:  Febrile overnight, but weaning well.  VITAL SIGNS: BP 95/64 mmHg  Pulse 81  Temp(Src) 99 F (37.2 C) (Oral)  Resp 16  Ht  (1.803 m)  Wt 92.7 kg (204 lb 5.9 oz)  BMI 28.52 kg/m2  SpO2 94%  HEMODYNAMICS:    VENTILATOR SETTINGS: Vent Mode:  [-] PRVC FiO2 (%):  [50 %] 50 % Set Rate:  [16 bmp] 16 bmp Vt Set:  [550 mL] 550 mL PEEP:  [5 cmH20] 5 cmH20 Plateau Pressure:  [17 cmH20-20 cmH20] 20 cmH20  INTAKE / OUTPUT: I/O last 3 completed shifts: In: 7529.3 [I.V.:2869.3; NG/GT:4160; IV Piggyback:500] Out: 7061 [Urine:7061]  PHYSICAL EXAMINATION: General: chronically ill appearing male, NAD  Neuro: sedated, RASS -2, very agitated this am per RN HEENT: Moist mucous membranes, ETT in place Cardiovascular: Irregularly irregular, no murmurs, previous sternotomy scar noted, afib 90's Lungs: resps even, non labored on vent, mild tachypnea, faint bibasilar crackles  Abdomen: Obese, soft, nontender, no appreciable hepatosplenomegaly Musculoskeletal: Warm extremities, scant BLE edema Skin: Hyperpigmented skin in feet  LABS:  BMET  Recent Labs Lab 11/11/15 0212 11/12/15 0332 11/13/15 0315  NA 154* 150* 142  K 3.7 4.0 3.6  CL 113* 108 98*  CO2 30 29 32  BUN 57* 65* 71*  CREATININE 1.53* 1.60* 1.78*  GLUCOSE 169* 378* 381*   Electrolytes  Recent Labs Lab 11/08/15 0449  11/11/15 0212  11/12/15 0332 11/13/15 0315  CALCIUM 9.0  < > 8.8* 8.6* 8.5*  MG 2.2  < > 2.3 2.3 2.2  PHOS 4.7*  --   --  4.3 5.1*  < > = values in this interval not displayed.  CBC  Recent Labs Lab 11/11/15 0212 11/12/15 0332 11/13/15 0315  WBC 10.5 11.6* 12.8*  HGB 11.8* 11.2* 11.3*  HCT 38.2* 37.0* 36.2*  PLT 223 235 248   Coag's  Recent Labs Lab 11/07/15 0423 11/08/15 0449  APTT 78* 74*   Sepsis Markers No results for input(s): LATICACIDVEN, PROCALCITON, O2SATVEN in the last 168 hours.  ABG  Recent Labs Lab 11/12/15 0325 11/13/15 0349  PHART 7.430 7.457*  PCO2ART 45.0 45.8*  PO2ART 90.8 73.3*    Liver Enzymes No results for input(s): AST, ALT, ALKPHOS, BILITOT, ALBUMIN in the last 168 hours.  Cardiac Enzymes No results for input(s): TROPONINI, PROBNP in the last 168 hours. Glucose  Recent Labs Lab 11/12/15 1208 11/12/15 1626 11/12/15 1942 11/12/15 2328 11/13/15 0312 11/13/15 0751  GLUCAP 326* 331* 296* 358* 327* 337*   Imaging Dg Chest Port 1 View  11/13/2015  CLINICAL DATA:  Intubation. EXAM: PORTABLE CHEST 1 VIEW COMPARISON:  11/12/2015 . FINDINGS: Endotracheal tube, NG tube, right IJ line stable position. Prior CABG. Cardiomegaly with normal pulmonary vascularity. Low lung volumes with persistent basilar atelectasis and/or infiltrates, progressive on the right. Small bilateral pleural effusions cannot be excluded. No pneumothorax. IMPRESSION: 1. Lines and tubes in stable position. 2. Prior CABG.  Cardiomegaly.  Normal pulmonary vascularity. 3. Persistent bibasilar atelectasis and/or infiltrates, progressive on the right. Small bilateral pleural effusions cannot be excluded. Electronically Signed   By: Maisie Fus  Register   On: 11/13/2015 07:26   STUDIES:   CULTURES: Resp 4/2 >> normal flora Blood 4/2 >> neg Urine 4/2 >> negative Blood 4/10>>> Sputum 4/11>>>  ANTIBIOTICS: vanco 4/2 >> 4/3>>>4/11>>> Aztreonam 4/2 >> 4/3 levaquin 4/2 >> 4/5 Cefepime  4/11>>>  SIGNIFICANT EVENTS:  LINES/TUBES: ETT 4/2 >> 4/12 Right Internal jugular CVC 4/2 >>  Foley catheter 4/2 >>  OG tube 4/2 >> 4/12  DISCUSSION: STEEN KICK is a 75 y.o. male with diabetes mellitus, hypertenstion, CAD s/p CABG, chronic atrial fibrillation and HFrEF who presented to the emergency department with shortness of breath 4/2 with subsequent PEA cardiac arrest and intubation. Clinical picture consistent with cardiogenic edema.   PULMONARY A: Acute hypoxemic respiratory failure Acute cardiogenic pulmonary edema Possible HCAP, doubt P:   PRVC 8cc/kg. Volume removal as below. Extubate today with no intention to reintubate, if fails then will start morphine. Control agitation - see neuro. Goal net NEG.  CARDIOVASCULAR A:  Acute on chronic systolic congestive heart failure exacerbation Hx Afib  PEA arrest (likely respiratory etiology) Chronic atrial fibrillation (narrow complex) , tachycardic NSTEMI, ? Stress-related  Intermittent LBBB P:  Off amio  ASA, heparin gtt, statin  Lasix drip as below.  Cardiology following - no plans cath   RENAL A; Acute on chronic kidney failure Hypokalemia  Hypernatremia  P:   Replace electrolytes as indicated Follow BMP Lasix 10 mg/hr x24 hours. Watch hypernatremia with diuresis  Free water 250 ml q6. KVO IVF. Zaroxolyn 10 mg PO x1.  GASTROINTESTINAL A:   SUP P:   PPI. D/C TF. Diet when ready to swallow.  HEMATOLOGIC A:   anticoag for A fib and ACS P:  Heparin gtt. F/u cbc.  INFECTIOUS A:   Possible HCAP P:   S/o course levaquin   ENDOCRINE A: DM P:   Insulin gtt per protocol.  NEUROLOGIC A:   S/p Cardiac arrest Sedation for MV Agitation, delirium  P:   D/C precedex gtt  D/C sedation. If fails then will add a morphine drip. Daily WUA.  FAMILY  - Updates: Spoke with sisters and daughters, ready for extubation, one way, if fails then morphine drip.  - Spoke with both daughters today,  patient is weaning relatively ok but O2 demand remains high.  After discussion with family, no trach/peg, will attempt a lasix drip to see if diureses can be achieved with the purpose of allowing some time for patient to spend with family, as if we extubate now will need a morphine drip.  Spoke with patient and he elected to give it another day.  The patient is critically ill with multiple organ systems failure and requires high complexity decision making for assessment and support, frequent evaluation and titration of therapies, application of advanced monitoring technologies and extensive interpretation of multiple databases.   Critical Care Time devoted to patient care services described in this note is  35  Minutes. This time reflects time of care of this signee Dr Koren Bound. This critical care time does not reflect procedure time, or teaching time or supervisory time of PA/NP/Med student/Med Resident etc but could involve care discussion time.  Alyson Reedy, M.D. [facility] Pulmonary/Critical Care Medicine. Pager: [phone]. After hours pager: [phone].

## 2015-11-13 NOTE — Progress Notes (Signed)
D/w Dr. Molli Knock - patient will be extubated today, until how he will tolerate this. Plan for possible cath depending on neurologic recovery and ability to tolerate extubation. Will monitor for potential additional cardiac needs.  Chrystie Nose, MD, [facility] Attending Cardiologist [facility]

## 2015-11-13 NOTE — Procedures (Signed)
Extubation Procedure Note  Patient Details:   Name: Randy Nunez DOB: 07-12-1941 MRN: [id number]   Airway Documentation:  Airway 7.5 mm (Active)  Secured at (cm) 23 cm 11/13/2015  7:09 AM  Measured From Lips 11/13/2015  7:09 AM  Secured Location Center 11/13/2015  7:09 AM  Secured By [employer] 11/13/2015  7:09 AM  Tube Holder Repositioned Yes 11/13/2015  7:09 AM  Cuff Pressure (cm H2O) 38 cm H2O 11/11/2015  3:25 PM  Site Condition Dry 11/13/2015  7:09 AM    Evaluation  O2 sats: transiently fell during during procedure Complications: No apparent complications Patient did tolerate procedure well. Bilateral Breath Sounds: Clear, Diminished   No  PT was terminally extubated to a 2L N/C  PT was not able to speak  Tarhonda Hollenberg, Duane Lope 11/13/2015, 9:25 AM

## 2015-11-14 ENCOUNTER — Encounter: Payer: Self-pay | Admitting: Licensed Clinical Social Worker

## 2015-11-14 ENCOUNTER — Other Ambulatory Visit: Payer: Self-pay | Admitting: Licensed Clinical Social Worker

## 2015-11-14 DIAGNOSIS — Z515 Encounter for palliative care: Secondary | ICD-10-CM

## 2015-11-14 LAB — CULTURE, RESPIRATORY

## 2015-11-14 LAB — CULTURE, RESPIRATORY W GRAM STAIN

## 2015-11-15 LAB — CULTURE, BLOOD (ROUTINE X 2)

## 2015-11-15 LAB — CULTURE, RESPIRATORY: CULTURE: NORMAL

## 2015-11-15 LAB — CULTURE, RESPIRATORY W GRAM STAIN

## 2015-11-19 ENCOUNTER — Ambulatory Visit: Payer: Self-pay | Admitting: Licensed Clinical Social Worker

## 2015-11-27 ENCOUNTER — Ambulatory Visit: Payer: PPO | Admitting: Cardiology

## 2015-12-02 NOTE — Progress Notes (Signed)
[facility]  [address] [place], [place] [address] [phone]

## 2015-12-02 NOTE — Progress Notes (Signed)
PULMONARY / CRITICAL CARE MEDICINE   Name: Randy Nunez MRN: [id number] DOB: [birth year]    ADMISSION DATE:  11/11/2015  CHIEF COMPLAINT:  Respiratory failure and cardiac arrest  HISTORY OF PRESENT ILLNESS:   ABBEY FAHNER is a 75 y.o. male with diabetes mellitus, hypertenstion, CAD s/p CABG, chronic atrial fibrillation and HFrEF who presented to outside emergency department with shortness of breath 4/2.  Had subsequent brief PEA arrest with ROSC after intubation.  He was then transferred to [facility] ICU.  Picture c/w cardiogenic pulmonary edema.    SUBJECTIVE:  Comfortable.  VITAL SIGNS: BP 190/119 mmHg  Pulse 73  Temp(Src) 99 F (37.2 C) (Oral)  Resp 19  Ht 5\' 11"  (1.803 m)  Wt 92.7 kg (204 lb 5.9 oz)  BMI 28.52 kg/m2  SpO2 90%  HEMODYNAMICS:    VENTILATOR SETTINGS:    INTAKE / OUTPUT: I/O last 3 completed shifts: In: 3951.5 [I.V.:2256.5; NG/GT:1445; IV Piggyback:250] Out: 2550 [Urine:2550]  PHYSICAL EXAMINATION: General: chronically ill appearing male, NAD  Neuro: sedated, RASS -2, very agitated this am per RN HEENT: Moist mucous membranes, ETT in place Cardiovascular: Irregularly irregular, no murmurs, previous sternotomy scar noted, afib 90's Lungs: resps even, non labored on vent, mild tachypnea, faint bibasilar crackles  Abdomen: Obese, soft, nontender, no appreciable hepatosplenomegaly Musculoskeletal: Warm extremities, scant BLE edema Skin: Hyperpigmented skin in feet  LABS:  BMET  Recent Labs Lab 11/11/15 0212 11/12/15 0332 11/13/15 0315  NA 154* 150* 142  K 3.7 4.0 3.6  CL 113* 108 98*  CO2 30 29 32  BUN 57* 65* 71*  CREATININE 1.53* 1.60* 1.78*  GLUCOSE 169* 378* 381*   Electrolytes  Recent Labs Lab 11/08/15 0449  11/11/15 0212 11/12/15 0332 11/13/15 0315  CALCIUM 9.0  < > 8.8* 8.6* 8.5*  MG 2.2  < > 2.3 2.3 2.2  PHOS 4.7*  --   --  4.3 5.1*  < > = values in this interval not displayed.  CBC  Recent Labs Lab  11/11/15 0212 11/12/15 0332 11/13/15 0315  WBC 10.5 11.6* 12.8*  HGB 11.8* 11.2* 11.3*  HCT 38.2* 37.0* 36.2*  PLT 223 235 248   Coag's  Recent Labs Lab 11/08/15 0449  APTT 74*   Sepsis Markers No results for input(s): LATICACIDVEN, PROCALCITON, O2SATVEN in the last 168 hours.  ABG  Recent Labs Lab 11/12/15 0325 11/13/15 0349  PHART 7.430 7.457*  PCO2ART 45.0 45.8*  PO2ART 90.8 73.3*    Liver Enzymes No results for input(s): AST, ALT, ALKPHOS, BILITOT, ALBUMIN in the last 168 hours.  Cardiac Enzymes No results for input(s): TROPONINI, PROBNP in the last 168 hours. Glucose  Recent Labs Lab 11/12/15 1208 11/12/15 1626 11/12/15 1942 11/12/15 2328 11/13/15 0312 11/13/15 0751  GLUCAP 326* 331* 296* 358* 327* 337*   Imaging No results found. STUDIES:   CULTURES: Resp 4/2 >> normal flora Blood 4/2 >> neg Urine 4/2 >> negative Blood 4/10>>> Sputum 4/11>>>  ANTIBIOTICS: vanco 4/2 >> 4/3>>>4/11>>> Aztreonam 4/2 >> 4/3 levaquin 4/2 >> 4/5 Cefepime 4/11>>>  SIGNIFICANT EVENTS:  LINES/TUBES: ETT 4/2 >> 4/12 Right Internal jugular CVC 4/2 >>  Foley catheter 4/2 >>  OG tube 4/2 >> 4/12  DISCUSSION: ULRICH CHAU is a 75 y.o. male with diabetes mellitus, hypertenstion, CAD s/p CABG, chronic atrial fibrillation and HFrEF who presented to the emergency department with shortness of breath 4/2 with subsequent PEA cardiac arrest and intubation. Clinical picture consistent with cardiogenic edema.   PULMONARY A:  Acute hypoxemic respiratory failure Acute cardiogenic pulmonary edema Possible HCAP, doubt P:   Comfort measures only.  CARDIOVASCULAR A:  Acute on chronic systolic congestive heart failure exacerbation Hx Afib  PEA arrest (likely respiratory etiology) Chronic atrial fibrillation (narrow complex) , tachycardic NSTEMI, ? Stress-related  Intermittent LBBB P:  D/C tele.  RENAL A; Acute on chronic kidney failure Hypokalemia  Hypernatremia   P:   D/C blood draws.  GASTROINTESTINAL A:   SUP P:   D/C TF.  HEMATOLOGIC A:   anticoag for A fib and ACS P:  D/C heparin. No further blood draws.  INFECTIOUS A:   Possible HCAP P:   D/C Abx.  ENDOCRINE A: DM P:   D/C CBGs and SSI.  NEUROLOGIC A:   S/p Cardiac arrest Sedation for MV Agitation, delirium  P:   Morphine for comfort.  FAMILY  - Updates: No family bedside.  Alyson Reedy, M.D. [facility] Pulmonary/Critical Care Medicine. Pager: [phone]. After hours pager: [phone].

## 2015-12-02 NOTE — Progress Notes (Signed)
Pt passed earlier this morning; comfort cart in room. Emotional support given to family.  Family still awaiting more members to come view pt.

## 2015-12-02 NOTE — Progress Notes (Signed)
No heart and lung sounds auscultated by two RN's [employer] and [employer]. Time of death 72. Family at bedside. CDS notified.

## 2015-12-02 NOTE — Progress Notes (Signed)
 of morphine wasted. Witnessed by Marshia Ly, RN.

## 2015-12-02 NOTE — Progress Notes (Signed)
PT Cancellation Note  Patient Details Name: Randy Nunez MRN: [id number] DOB: Apr 09, 1941   Cancelled Treatment:    Reason Eval/Treat Not Completed: Patient not medically ready (active bedrest orders at this time)   Fabio Asa 11/21/2015, 7:32 AM Charlotte Crumb, PT DPT  [phone]

## 2015-12-02 NOTE — Discharge Summary (Signed)
Randy Nunez, Randy Nunez                 ACCOUNT NO.:  [id number]  MEDICAL RECORD NO.:  [id number]  LOCATION:  2H02C                        FACILITY:  [facility]  PHYSICIAN:  Felipa Evener, MD  DATE OF BIRTH:  1941/05/23  DATE OF ADMISSION:  11/02/2015 DATE OF DISCHARGE:  11-16-2015                              DISCHARGE SUMMARY   PRIMARY DIAGNOSIS/CAUSE OF DEATH:  Pulseless electrical activity cardiac arrest.  SECONDARY DIAGNOSES:  Acute respiratory failure, iatrogenic pulmonary edema, acute-on-chronic systolic congestive heart failure, acute-on- chronic kidney failure, hyperkalemia, hypernatremia, anoxic brain injury, anticoagulation for atrial fibrillation, acute coronary syndrome, agitation, delirium.  HOSPITAL COURSE:  The patient is a 75 year old male with past medical history significant for congestive heart failure and coronary artery disease as well as chronic renal failure, presents to the hospital for PEA cardiac arrest event at home.  Patient brought into the hospital to the coronary care unit and hypothermia protocol was followed.  After rewarming, the patient was able to wake up and wean.  However, his mental status remained very poor.  We had a family meeting.  The family said the patient would not want tracheostomy and PEG tube, after which the patient was extubated.  He did relatively well for approximately 30 minutes; however, started developing respiratory distress.  Again, the family requested to keep the patient comfortable.  At which time, morphine was started, and the patient expired approximately 18 hours later comfortable.  Family at bedside.     Felipa Evener, MD     WJY/MEDQ  D:  11/16/15  T:  11/15/2015  Job:  [id number]

## 2015-12-02 NOTE — Patient Outreach (Signed)
Assessment:  CSW received call from Raiford Noble, RN and [place] [facility] on Dec 05, 2015.  Randy Nunez and CSW spoke of current status of client.  Randy Nunez and CSW reviewed EPIC notes on client on Dec 05, 2015. Raiford Noble informed CSW on 2015/12/05 that client, Randy Nunez, had expired on 2015/12/05 at [facility]. Client had been receiving care in the Intensive Care Unit at [facility]. Client had been on ventilator support. Randy Nunez reported to CSW that client died on 12/05/2015 at [facility].  Due to death of Randy Nunez on 12/05/15, CSW is discharging client on 12-05-15 from [facility] CSW services.   Plan: CSW is discharging Randy Nunez on 2015/12/05 from [facility] CSW services due to death of client on Dec 05, 2015. CSW to inform Randy Nunez that CSW discharged client on 05-Dec-2015 due to death of client on Dec 05, 2015. CSW to fax physician case closure letter to Randy Nunez informing Randy Nunez that CSW discharged client from [facility] CSW services on 12-05-2015 due to death of client on 05-Dec-2015.   Randy Nunez MSW, LCSW Licensed Clinical Social Worker [facility] Care Management [phone]

## 2015-12-02 DEATH — deceased

## 2017-03-22 IMAGING — CR DG CHEST 1V PORT
1 series · 1 of 1 positions shown · non-contrast
Comparison: Portable chest x-ray of 11/05/2015 and 11/03/2015

CLINICAL DATA: Ventilator dependent respiratory failure

EXAM:
PORTABLE CHEST 1 VIEW

[AP]
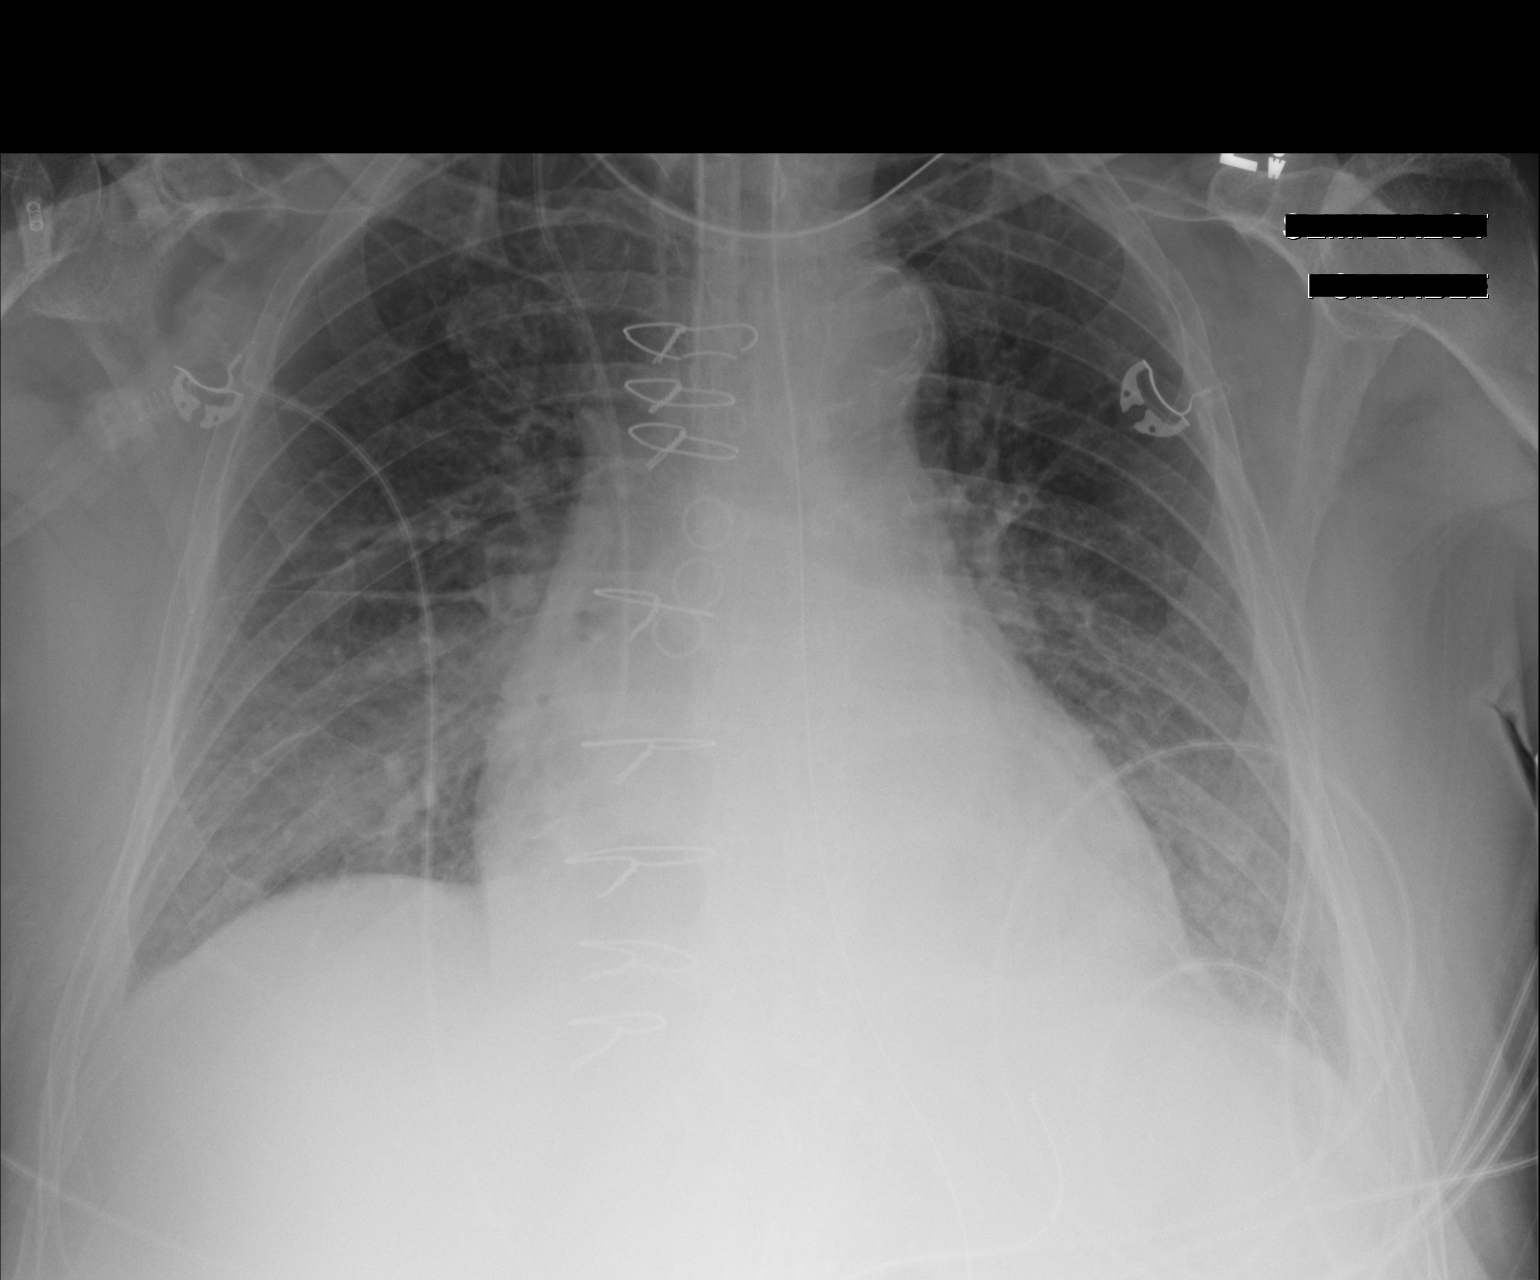

[1 of 1 positions shown; findings below may reference images not displayed]

FINDINGS: There is little change in probable interstitial edema pattern with
small effusions and cardiomegaly. The tip of the endotracheal tube
is only 1.4 cm above the carina. Right IJ central venous line
overlies the lower SVC and and NG tube coils in the stomach. Mild
cardiomegaly is stable.
IMPRESSION: 1. Little change in interstitial edema pattern.
2. Tip of endotracheal tube only 1.4 cm above the carina.

## 2017-03-24 IMAGING — CR DG CHEST 1V PORT
2 series · 2 of 2 positions shown · non-contrast
Comparison: 11/08/2015, 11/07/2015

CLINICAL DATA: 74-year-old male with a history of acute respiratory
failure.

EXAM:
PORTABLE CHEST 1 VIEW

[AP (1 of 2)]
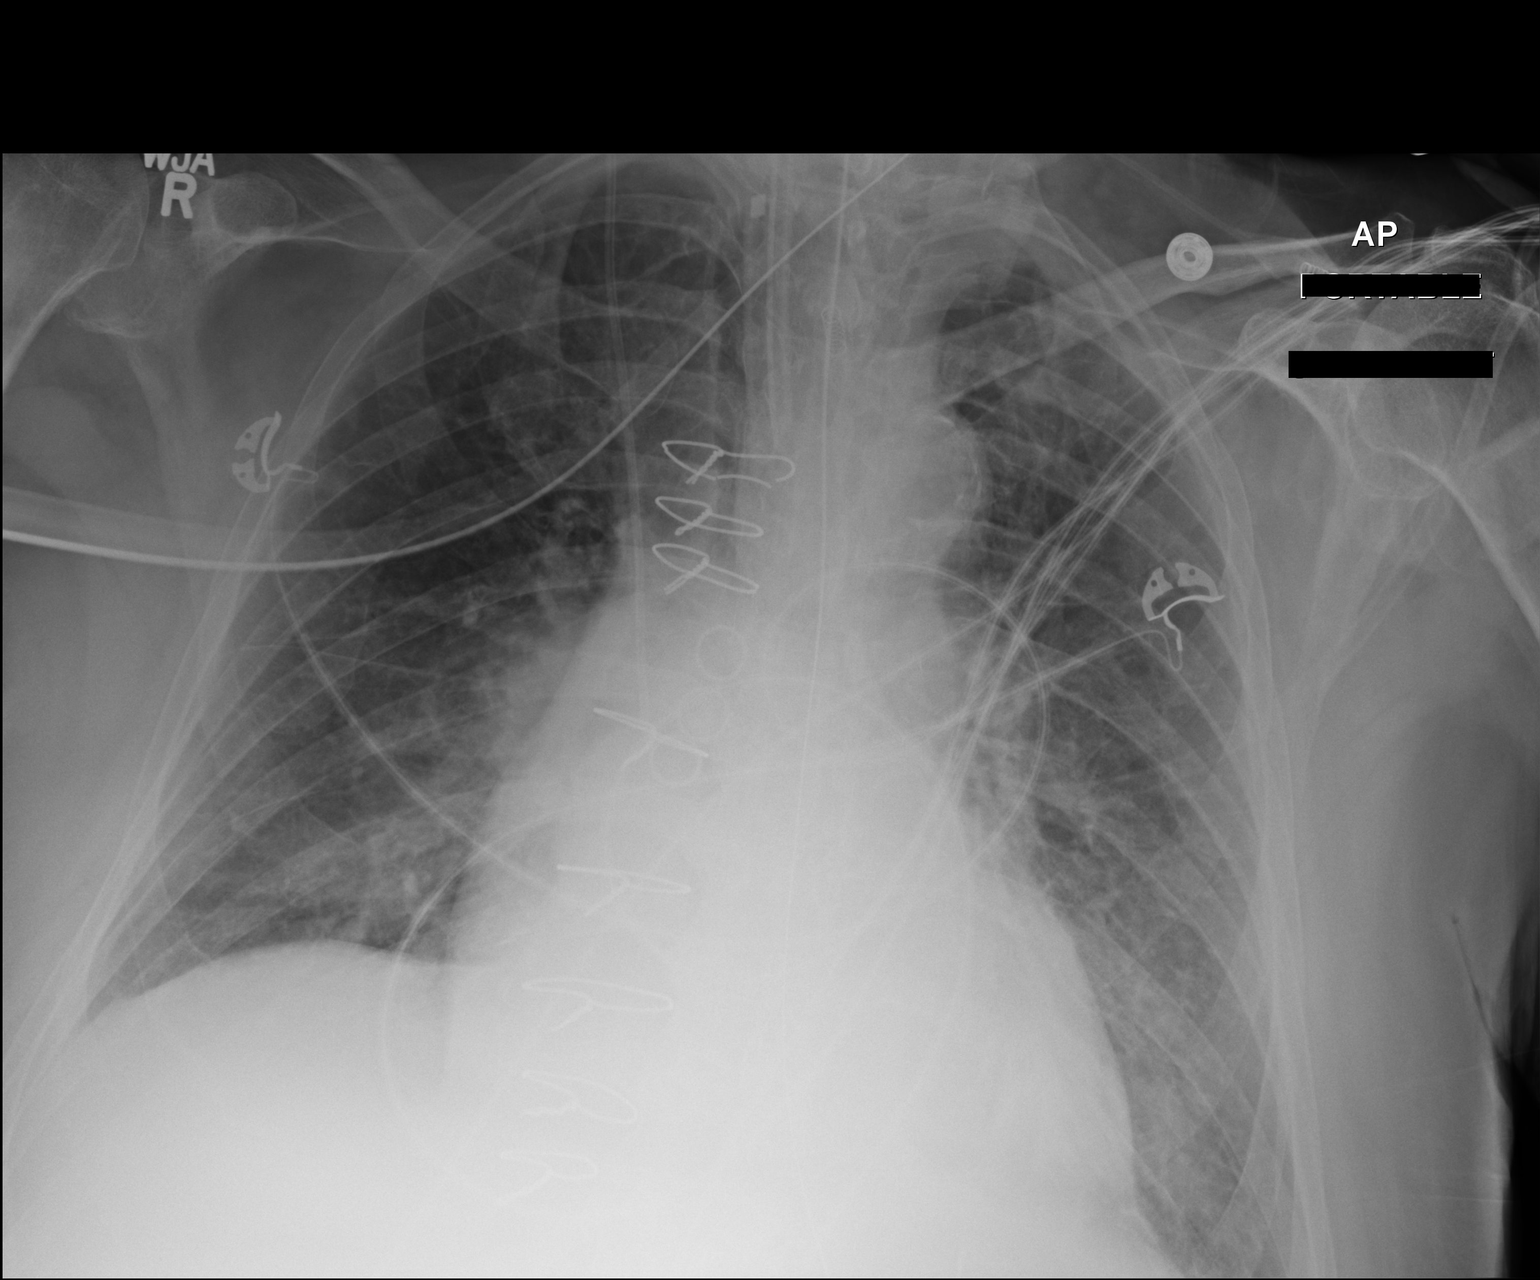

[AP (2 of 2)]
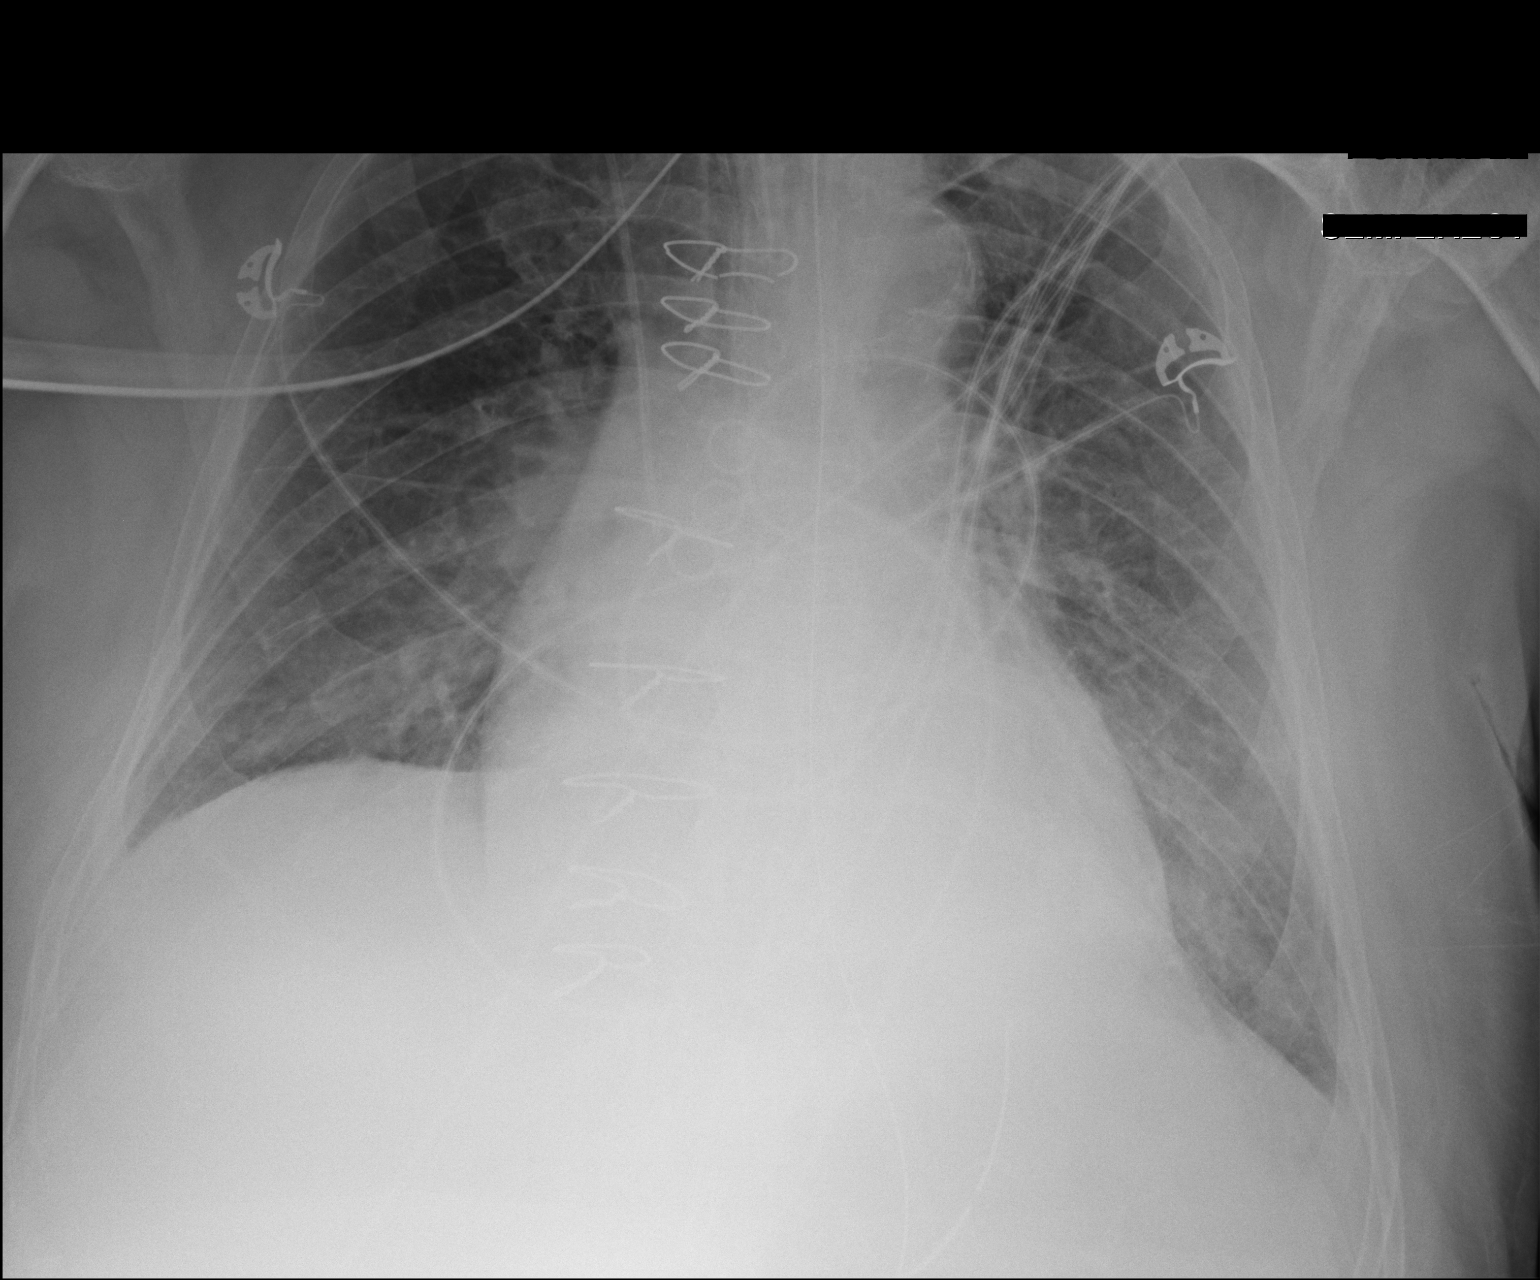

[2 of 2 positions shown; findings below may reference images not displayed]

FINDINGS: Cardiomediastinal silhouette unchanged in size and contour.

Surgical changes of median sternotomy and CABG.

Endotracheal tube appears to terminate approximately 5.4 cm above
the carina.

Gastric tube terminates out of the field of view.

Right IJ approach central venous catheter appears to terminate in
the superior vena cava.

Similar appearance of mixed interstitial and airspace opacities
bilateral lungs. No pneumothorax.

No displaced fracture.
IMPRESSION: Similar appearance of the chest x-ray with persisting mixed
interstitial and airspace opacity, potentially a combination of
atelectasis, edema, and/ or consolidation. No large pleural
effusion.

Endotracheal tube has been slightly withdrawn, and terminates
suitably above the carina, measuring approximately 5.5 cm. Otherwise
unchanged support apparatus.

## 2017-03-27 IMAGING — CR DG CHEST 1V PORT
1 series · 1 of 1 positions shown · non-contrast
Comparison: 11/11/2015

CLINICAL DATA: Intubated

EXAM:
PORTABLE CHEST 1 VIEW

[AP]
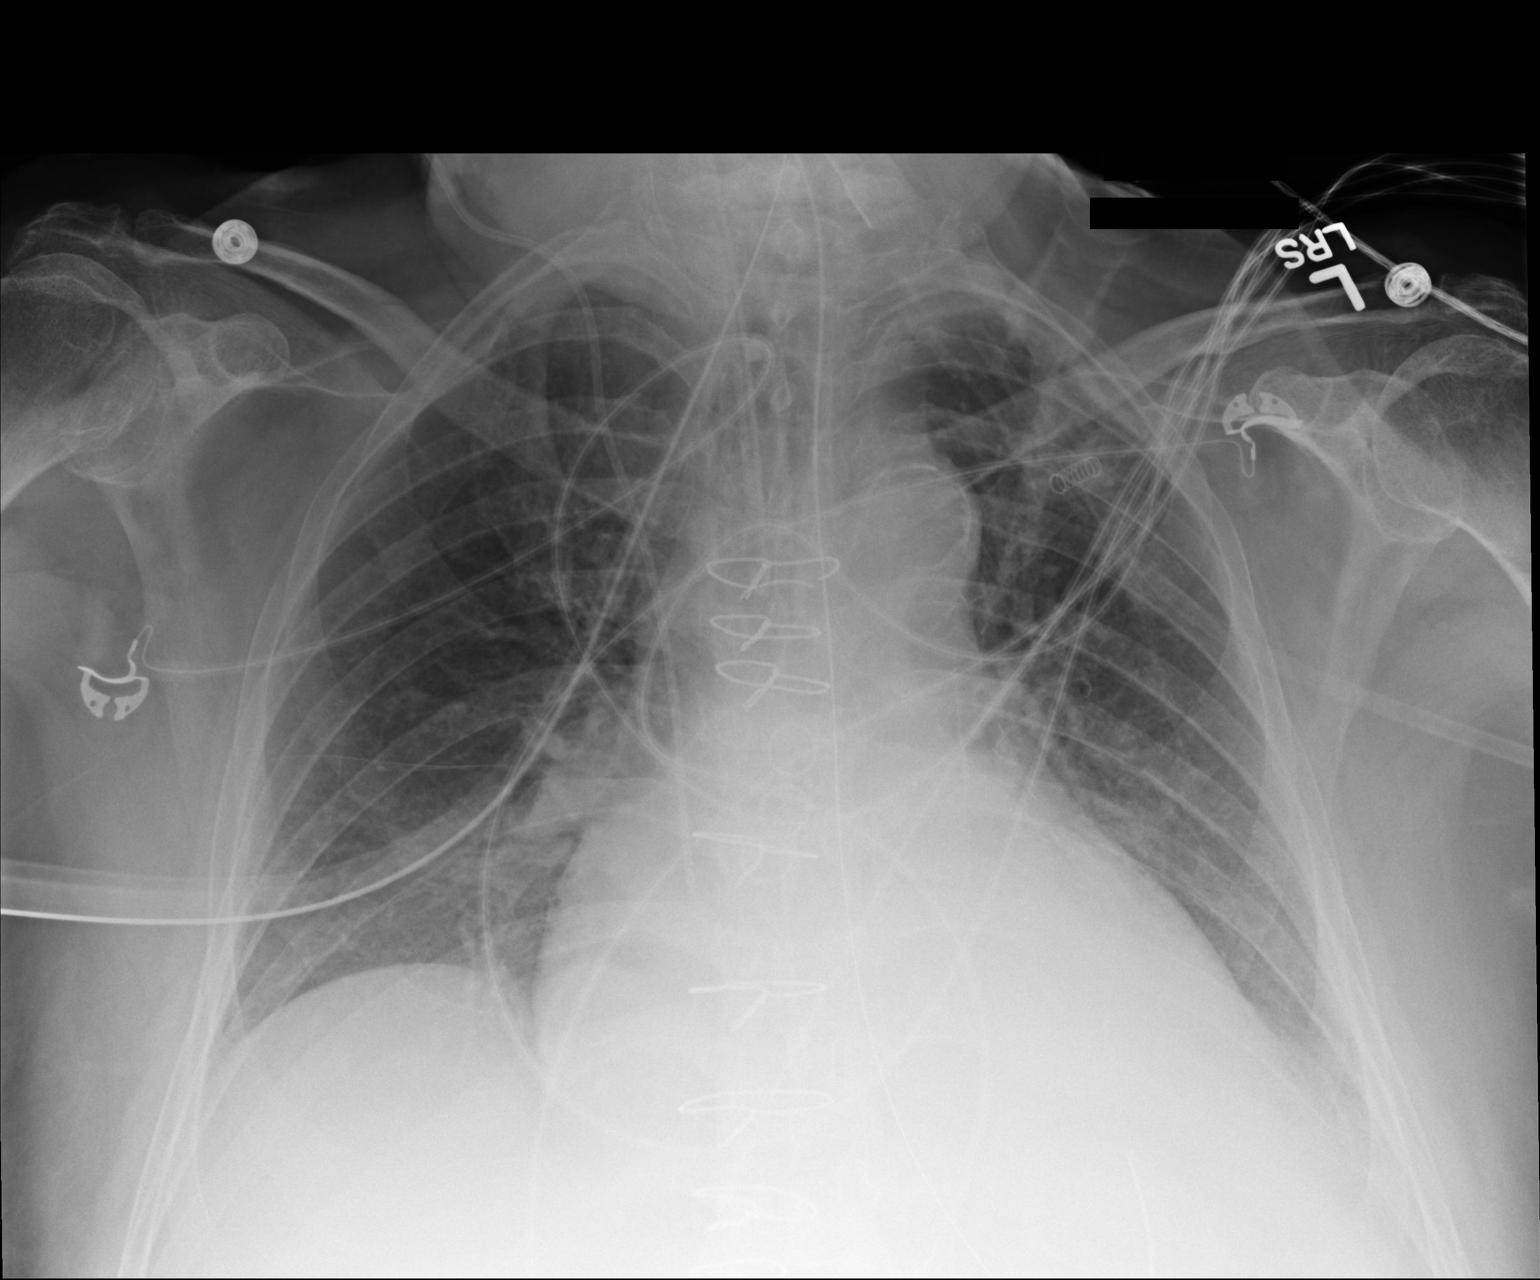

[1 of 1 positions shown; findings below may reference images not displayed]

FINDINGS: Prior CABG. Cardiomegaly. Support devices are stable. Increasing
left lower lobe atelectasis or infiltrate. Possible small left
pleural effusion. Minimal right base atelectasis.
IMPRESSION: Increasing left basilar atelectasis or infiltrate. Possible small
left effusion.

Minimal right base atelectasis.

## 2017-03-28 IMAGING — CR DG CHEST 1V PORT
1 series · 1 of 1 positions shown · non-contrast
Comparison: 11/12/2015 .

CLINICAL DATA: Intubation.

EXAM:
PORTABLE CHEST 1 VIEW

[AP]
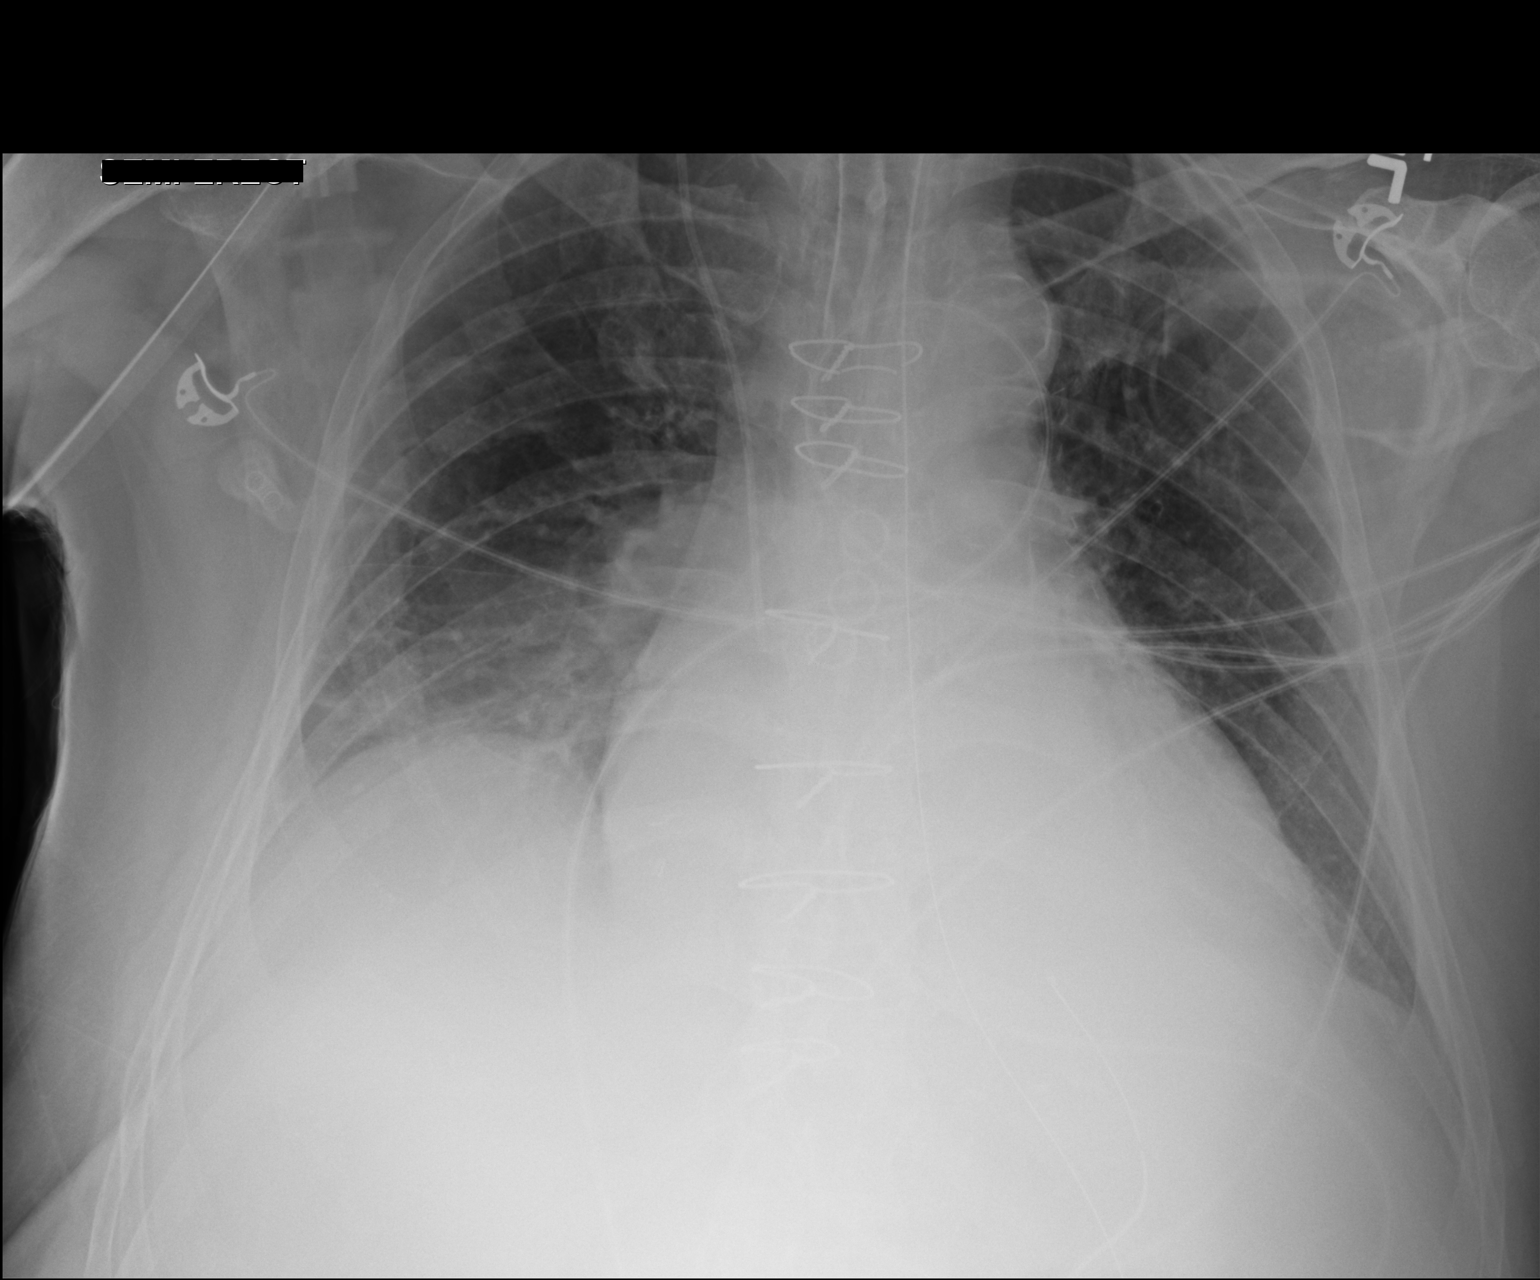

[1 of 1 positions shown; findings below may reference images not displayed]

FINDINGS: Endotracheal tube, NG tube, right IJ line stable position. Prior
CABG. Cardiomegaly with normal pulmonary vascularity. Low lung
volumes with persistent basilar atelectasis and/or infiltrates,
progressive on the right. Small bilateral pleural effusions cannot
be excluded. No pneumothorax.
IMPRESSION: 1. Lines and tubes in stable position.

2. Prior CABG.  Cardiomegaly.  Normal pulmonary vascularity.

3. Persistent bibasilar atelectasis and/or infiltrates, progressive
on the right. Small bilateral pleural effusions cannot be excluded.

## 2018-11-29 ENCOUNTER — Encounter: Payer: Self-pay | Admitting: *Deleted
